# Patient Record
Sex: Female | Born: 1995
Health system: Southern US, Community
[De-identification: ages and names within clinical notes are randomized; demographics above are authoritative.]

## PROBLEM LIST (undated history)

## (undated) DIAGNOSIS — N920 Excessive and frequent menstruation with regular cycle: Secondary | ICD-10-CM

## (undated) DIAGNOSIS — Z789 Other specified health status: Secondary | ICD-10-CM

## (undated) HISTORY — PX: NO PAST SURGERIES: SHX2092

## (undated) HISTORY — DX: Excessive and frequent menstruation with regular cycle: N92.0

---
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
## 2014-01-30 ENCOUNTER — Encounter: Payer: Medicaid Other | Admitting: Obstetrics & Gynecology

## 2014-03-20 ENCOUNTER — Ambulatory Visit (INDEPENDENT_AMBULATORY_CARE_PROVIDER_SITE_OTHER): Payer: Medicaid Other | Admitting: Nurse Practitioner

## 2014-03-20 ENCOUNTER — Encounter: Payer: Self-pay | Admitting: Nurse Practitioner

## 2014-03-20 VITALS — BP 118/64 | HR 89 | Temp 98.2°F | Ht 65.0 in | Wt 160.0 lb

## 2014-03-20 DIAGNOSIS — Z309 Encounter for contraceptive management, unspecified: Secondary | ICD-10-CM | POA: Insufficient documentation

## 2014-03-20 DIAGNOSIS — Z308 Encounter for other contraceptive management: Secondary | ICD-10-CM

## 2014-03-20 NOTE — Progress Notes (Signed)
Referred for " extra- heavy periods and very bad  Cramps for several periods". States last period was better- not heavy.

## 2014-03-20 NOTE — Patient Instructions (Signed)
Contraception Choices Contraception (birth control) is the use of any methods or devices to prevent pregnancy. Below are some methods to help avoid pregnancy. HORMONAL METHODS   Contraceptive implant. This is a thin, plastic tube containing progesterone hormone. It does not contain estrogen hormone. Your health care provider inserts the tube in the inner part of the upper arm. The tube can remain in place for up to 3 years. After 3 years, the implant must be removed. The implant prevents the ovaries from releasing an egg (ovulation), thickens the cervical mucus to prevent sperm from entering the uterus, and thins the lining of the inside of the uterus.  Progesterone-only injections. These injections are given every 3 months by your health care provider to prevent pregnancy. This synthetic progesterone hormone stops the ovaries from releasing eggs. It also thickens cervical mucus and changes the uterine lining. This makes it harder for sperm to survive in the uterus.  Birth control pills. These pills contain estrogen and progesterone hormone. They work by preventing the ovaries from releasing eggs (ovulation). They also cause the cervical mucus to thicken, preventing the sperm from entering the uterus. Birth control pills are prescribed by a health care provider.Birth control pills can also be used to treat heavy periods.  Minipill. This type of birth control pill contains only the progesterone hormone. They are taken every day of each month and must be prescribed by your health care provider.  Birth control patch. The patch contains hormones similar to those in birth control pills. It must be changed once a week and is prescribed by a health care provider.  Vaginal ring. The ring contains hormones similar to those in birth control pills. It is left in the vagina for 3 weeks, removed for 1 week, and then a new one is put back in place. The patient must be comfortable inserting and removing the ring  from the vagina.A health care provider's prescription is necessary.  Emergency contraception. Emergency contraceptives prevent pregnancy after unprotected sexual intercourse. This pill can be taken right after sex or up to 5 days after unprotected sex. It is most effective the sooner you take the pills after having sexual intercourse. Most emergency contraceptive pills are available without a prescription. Check with your pharmacist. Do not use emergency contraception as your only form of birth control. BARRIER METHODS   Female condom. This is a thin sheath (latex or rubber) that is worn over the penis during sexual intercourse. It can be used with spermicide to increase effectiveness.  Female condom. This is a soft, loose-fitting sheath that is put into the vagina before sexual intercourse.  Diaphragm. This is a soft, latex, dome-shaped barrier that must be fitted by a health care provider. It is inserted into the vagina, along with a spermicidal jelly. It is inserted before intercourse. The diaphragm should be left in the vagina for 6 to 8 hours after intercourse.  Cervical cap. This is a round, soft, latex or plastic cup that fits over the cervix and must be fitted by a health care provider. The cap can be left in place for up to 48 hours after intercourse.  Sponge. This is a soft, circular piece of polyurethane foam. The sponge has spermicide in it. It is inserted into the vagina after wetting it and before sexual intercourse.  Spermicides. These are chemicals that kill or block sperm from entering the cervix and uterus. They come in the form of creams, jellies, suppositories, foam, or tablets. They do not require a   prescription. They are inserted into the vagina with an applicator before having sexual intercourse. The process must be repeated every time you have sexual intercourse. INTRAUTERINE CONTRACEPTION  Intrauterine device (IUD). This is a T-shaped device that is put in a woman's uterus  during a menstrual period to prevent pregnancy. There are 2 types:  Copper IUD. This type of IUD is wrapped in copper wire and is placed inside the uterus. Copper makes the uterus and fallopian tubes produce a fluid that kills sperm. It can stay in place for 10 years.  Hormone IUD. This type of IUD contains the hormone progestin (synthetic progesterone). The hormone thickens the cervical mucus and prevents sperm from entering the uterus, and it also thins the uterine lining to prevent implantation of a fertilized egg. The hormone can weaken or kill the sperm that get into the uterus. It can stay in place for 3-5 years, depending on which type of IUD is used. PERMANENT METHODS OF CONTRACEPTION  Female tubal ligation. This is when the woman's fallopian tubes are surgically sealed, tied, or blocked to prevent the egg from traveling to the uterus.  Hysteroscopic sterilization. This involves placing a small coil or insert into each fallopian tube. Your doctor uses a technique called hysteroscopy to do the procedure. The device causes scar tissue to form. This results in permanent blockage of the fallopian tubes, so the sperm cannot fertilize the egg. It takes about 3 months after the procedure for the tubes to become blocked. You must use another form of birth control for these 3 months.  Female sterilization. This is when the female has the tubes that carry sperm tied off (vasectomy).This blocks sperm from entering the vagina during sexual intercourse. After the procedure, the man can still ejaculate fluid (semen). NATURAL PLANNING METHODS  Natural family planning. This is not having sexual intercourse or using a barrier method (condom, diaphragm, cervical cap) on days the woman could become pregnant.  Calendar method. This is keeping track of the length of each menstrual cycle and identifying when you are fertile.  Ovulation method. This is avoiding sexual intercourse during ovulation.  Symptothermal  method. This is avoiding sexual intercourse during ovulation, using a thermometer and ovulation symptoms.  Post-ovulation method. This is timing sexual intercourse after you have ovulated. Regardless of which type or method of contraception you choose, it is important that you use condoms to protect against the transmission of sexually transmitted infections (STIs). Talk with your health care provider about which form of contraception is most appropriate for you. Document Released: 05/19/2005 Document Revised: 05/24/2013 Document Reviewed: 11/11/2012 [employer] Patient Information 2015 [employer]. This information is not intended to replace advice given to you by your health care provider. Make sure you discuss any questions you have with your health care provider.  

## 2014-03-20 NOTE — Progress Notes (Signed)
History:  Alexandra MckusickBreasha Freeman is a 18 y.o. No obstetric history on file. who [facility]. She was on Depo Provera for 5 years for dysmenorrhea and when she came off that she had very heavy and prolonged bleeding for several months. Her menses is now stabilized and regular. Her last cycle lasted 4 days.She has used BCPs in past. She is currently sexually active with one partner and does not plan pregnancy for the next 5 years.   The following portions of the patient's history were reviewed and updated as appropriate: allergies, current medications, past family history, past medical history, past social history, past surgical history and problem list.  Review of Systems:    Objective:  Physical Exam BP 118/64  Pulse 89  Temp(Src) 98.2 F (36.8 C)  Ht 5\' 5"  (1.651 m)  Wt 160 lb (72.576 kg)  BMI 26.63 kg/m2  LMP 03/09/2014 GENERAL: Well-developed, well-nourished female in no acute distress.  HEENT: Normocephalic, atraumatic.  NECK: Supple. Normal thyroid.  LUNGS: Normal rate. Clear to auscultation bilaterally.  HEART: Regular rate and rhythm with no adventitious sounds.  EXTREMITIES: No cyanosis, clubbing, or edema, 2+ distal pulses.   Labs and Imaging No results found. No results found for this or any previous visit (from the past 24 hour(s)).  Assessment & Plan:  Assessment:  Contraception  Plans: Lengthy Discussion about her choices / she will consider her options/ written brochures were also given  Delbert PhenixLinda M Harmoni Lucus, NP 03/20/2014 4:45 PM

## 2015-06-19 ENCOUNTER — Ambulatory Visit: Payer: Medicaid Other | Admitting: Obstetrics

## 2015-07-06 ENCOUNTER — Encounter: Payer: Self-pay | Admitting: *Deleted

## 2015-08-01 ENCOUNTER — Ambulatory Visit: Payer: Medicaid Other | Admitting: Obstetrics & Gynecology

## 2016-07-01 ENCOUNTER — Encounter (HOSPITAL_COMMUNITY): Payer: Self-pay | Admitting: Emergency Medicine

## 2016-07-01 ENCOUNTER — Ambulatory Visit (HOSPITAL_COMMUNITY)
Admission: EM | Admit: 2016-07-01 | Discharge: 2016-07-01 | Disposition: A | Discharge status: Home / Self Care | Payer: Medicaid Other | Attending: Family Medicine | Admitting: Family Medicine

## 2016-07-01 DIAGNOSIS — R69 Illness, unspecified: Secondary | ICD-10-CM

## 2016-07-01 DIAGNOSIS — J111 Influenza due to unidentified influenza virus with other respiratory manifestations: Secondary | ICD-10-CM

## 2016-07-01 MED ORDER — IPRATROPIUM BROMIDE 0.06 % NA SOLN: 2.0000 | Freq: Four times a day (QID) | NASAL | 1 refills | Status: DC

## 2016-07-01 MED ORDER — GUAIFENESIN-CODEINE 100-10 MG/5ML PO SYRP: 10.0000 mL | ORAL_SOLUTION | Freq: Four times a day (QID) | ORAL | 0 refills | Status: DC | PRN

## 2016-07-01 NOTE — ED Provider Notes (Signed)
[facility]    CSN: [id number] Arrival date & time: 07/01/16  1522     History   Chief Complaint Chief Complaint  Patient presents with  . Influenza    HPI Alexandra Freeman is a 21 y.o. female.   The history is provided by the patient.  Influenza  Presenting symptoms: cough, fever, myalgias and rhinorrhea   Presenting symptoms: no sore throat and no vomiting   Severity:  Moderate Onset quality:  Sudden Duration:  3 days Progression:  Worsening Chronicity:  New Relieved by:  None tried Worsened by:  Nothing Ineffective treatments:  None tried Associated symptoms: nasal congestion   Risk factors: sick contacts     Past Medical History:  Diagnosis Date  . Menorrhagia     Patient Active Problem List   Diagnosis Date Noted  . Contraception management 03/20/2014    History reviewed. No pertinent surgical history.  OB History    No data available       Home Medications    Prior to Admission medications   Medication Sig Start Date End Date Taking? Authorizing Provider  PRESCRIPTION MEDICATION Inject 1 each as directed See admin instructions. Depo birth control injection every three months   Yes Historical Provider, MD  ibuprofen (ADVIL,MOTRIN) 800 MG tablet Take 1 tablet (800 mg total) by mouth 3 (three) times daily. 12/12/12   Francee PiccoloJennifer Piepenbrink, PA-C    Family History Family History  Problem Relation Age of Onset  . Polymyositis Mother     Social History Social History  Substance Use Topics  . Smoking status: Never Smoker  . Smokeless tobacco: Never Used  . Alcohol use No     Allergies   Patient has no known allergies.   Review of Systems Review of Systems  Constitutional: Positive for fever.  HENT: Positive for congestion, postnasal drip and rhinorrhea. Negative for sore throat.   Respiratory: Positive for cough.   Cardiovascular: Negative.   Gastrointestinal: Negative.  Negative for vomiting.  Musculoskeletal: Positive for  myalgias.  All other systems reviewed and are negative.    Physical Exam Triage Vital Signs ED Triage Vitals [07/01/16 1638]  Enc Vitals Group     BP 136/88     Pulse Rate (!) 121     Resp      Temp 100.7 F (38.2 C)     Temp Source Oral     SpO2 100 %     Weight      Height      Head Circumference      Peak Flow      Pain Score 8     Pain Loc      Pain Edu?      Excl. in GC?    No data found.   Updated Vital Signs BP 136/88 (BP Location: Right Arm)   Pulse (!) 121   Temp 100.7 F (38.2 C) (Oral)   LMP 07/01/2016   SpO2 100%   Visual Acuity Right Eye Distance:   Left Eye Distance:   Bilateral Distance:    Right Eye Near:   Left Eye Near:    Bilateral Near:     Physical Exam  Constitutional: She is oriented to person, place, and time. She appears well-developed and well-nourished. No distress.  HENT:  Right Ear: External ear normal.  Left Ear: External ear normal.  Nose: Nose normal.  Mouth/Throat: Oropharynx is clear and moist.  Eyes: Pupils are equal, round, and reactive to light.  Neck: Normal  range of motion.  Cardiovascular: Normal rate, regular rhythm and normal heart sounds.   Pulmonary/Chest: Effort normal and breath sounds normal.  Lymphadenopathy:    She has no cervical adenopathy.  Neurological: She is alert and oriented to person, place, and time.  Skin: Skin is warm and dry.  Nursing note and vitals reviewed.    UC Treatments / Results  Labs (all labs ordered are listed, but only abnormal results are displayed) Labs Reviewed - No data to display  EKG  EKG Interpretation None       Radiology No results found.  Procedures Procedures (including critical care time)  Medications Ordered in UC Medications - No data to display   Initial Impression / Assessment and Plan / UC Course  I have reviewed the triage vital signs and the nursing notes.  Pertinent labs & imaging results that were available during my care of the  patient were reviewed by me and considered in my medical decision making (see chart for details).      Final Clinical Impressions(s) / UC Diagnoses   Final diagnoses:  None    New Prescriptions New Prescriptions   No medications on file     Linna Hoff, MD 07/01/16 1610

## 2016-07-01 NOTE — ED Triage Notes (Signed)
Pt has been suffering from a cough, left ear pain and ringing, body aches, fatigue and fever for two days.

## 2016-07-01 NOTE — Discharge Instructions (Signed)
Drink plenty of fluids as discussed, use medicine as prescribed, and mucinex or delsym for cough.advil or tylenol for fever and aching. Return or see your doctor if further problems

## 2016-07-03 ENCOUNTER — Telehealth (HOSPITAL_COMMUNITY): Payer: Self-pay | Admitting: Emergency Medicine

## 2016-07-03 ENCOUNTER — Ambulatory Visit (HOSPITAL_COMMUNITY)
Admission: EM | Admit: 2016-07-03 | Discharge: 2016-07-03 | Disposition: A | Discharge status: Home / Self Care | Payer: Self-pay

## 2016-07-03 NOTE — Telephone Encounter (Signed)
Patient returned to [facility] requesting a work note.  Spoke with dr Piedad Climeshonig about patient.  Dr Piedad Climeshonig agreed to work note for patient to return to work tomorrow on 07/04/2016

## 2016-07-03 NOTE — Telephone Encounter (Signed)
Provided note to patient in lobby.  Patient persists that she cannot taste foods.  Encouraged patient to push fluids, not foods.  Patient insists work is not expecting her back until Monday.  Suggested patient see pcp.  Suggested work might require formal paperwork and we do not do paperwork.  Told patient if she needed more time, she would need to check in for reevaluation.

## 2016-09-29 ENCOUNTER — Encounter (HOSPITAL_COMMUNITY): Payer: Self-pay | Admitting: Emergency Medicine

## 2016-09-29 ENCOUNTER — Ambulatory Visit (HOSPITAL_COMMUNITY)
Admission: EM | Admit: 2016-09-29 | Discharge: 2016-09-29 | Disposition: A | Discharge status: Home / Self Care | Payer: Self-pay | Attending: Internal Medicine | Admitting: Internal Medicine

## 2016-09-29 DIAGNOSIS — H6123 Impacted cerumen, bilateral: Secondary | ICD-10-CM

## 2016-09-29 NOTE — ED Triage Notes (Signed)
Left ear pain x 3 days. Has used otc drops and sweet oil and will "not pop".  Patient feels hearing is normal.  Denies cough, cold or runny nose

## 2016-09-29 NOTE — ED Provider Notes (Signed)
CSN: [id number]     Arrival date & time 09/29/16  1009 History   First MD Initiated Contact with Patient 09/29/16 1152     Chief Complaint  Patient presents with  . Otalgia   (Consider location/radiation/quality/duration/timing/severity/associated sxs/prior Treatment) 21 year old female states that her left ear feels like is clogged up and it needs to pop to unclear. This started about 3 days ago. Denies ear pain. Denies trauma. Denies loss of hearing although when placing earbuds in her ear for glistening she believes that it is more decreased in the left than the right. Denies PND, URI symptoms.      Past Medical History:  Diagnosis Date  . Menorrhagia    History reviewed. No pertinent surgical history. Family History  Problem Relation Age of Onset  . Polymyositis Mother    Social History  Substance Use Topics  . Smoking status: Never Smoker  . Smokeless tobacco: Never Used  . Alcohol use No   OB History    No data available     Review of Systems  Constitutional: Negative.   HENT: Positive for hearing loss. Negative for congestion, ear discharge, ear pain, rhinorrhea, sinus pain, sinus pressure and sore throat.   Eyes: Negative.   Respiratory: Negative.   Gastrointestinal: Negative.   All other systems reviewed and are negative.   Allergies  Patient has no known allergies.  Home Medications   Prior to Admission medications   Medication Sig Start Date End Date Taking? Authorizing Provider  guaiFENesin-codeine (ROBITUSSIN AC) 100-10 MG/5ML syrup Take 10 mLs by mouth 4 (four) times daily as needed for cough. 07/01/16   Linna Hoff, MD  ibuprofen (ADVIL,MOTRIN) 800 MG tablet Take 1 tablet (800 mg total) by mouth 3 (three) times daily. 12/12/12   Jennifer Piepenbrink, PA-C  ipratropium (ATROVENT) 0.06 % nasal spray Place 2 sprays into both nostrils 4 (four) times daily. 07/01/16   Linna Hoff, MD  PRESCRIPTION MEDICATION Inject 1 each as directed See admin  instructions. Depo birth control injection every three months    Historical Provider, MD   Meds Ordered and Administered this Visit  Medications - No data to display  BP (!) 129/97 (BP Location: Left Arm) Comment: notified rn  Pulse 63   Temp 98.6 F (37 C) (Oral)   Resp 14   LMP 09/26/2016   SpO2 100%  No data found.   Physical Exam  Constitutional: She is oriented to person, place, and time. She appears well-developed and well-nourished. No distress.  HENT:  Mouth/Throat: No oropharyngeal exudate.  Bilateral TMs obscured by cerumen. Oropharynx is clear and moist.  Eyes: EOM are normal.  Neck: Normal range of motion. Neck supple.  Cardiovascular: Normal rate.   Pulmonary/Chest: Effort normal.  Musculoskeletal: Normal range of motion.  Lymphadenopathy:    She has no cervical adenopathy.  Neurological: She is alert and oriented to person, place, and time.  Skin: Skin is warm and dry.  Psychiatric: She has a normal mood and affect.  Nursing note and vitals reviewed.   Urgent Care Course     Procedures (including critical care time)  Labs Review Labs Reviewed - No data to display  Imaging Review No results found.   Visual Acuity Review  Right Eye Distance:   Left Eye Distance:   Bilateral Distance:    Right Eye Near:   Left Eye Near:    Bilateral Near:         MDM   1. Bilateral impacted cerumen  Irrigated years as discussed in the shower. May also use any over-the-counter irrigation totals. Use these methods regularly to help keep wax from building up. Also realize that if you use ear buds that this generally keeps wax coming out and pushes the wax inward toward the eardrum.  Post irrigation the left ear is clear with minor injection to the TM. Patient states she can hear better, feels better and does not feel the popping or pressure sensation. The right ear is partially viewed has some wax remains in the EAC. She has no pain. In generally feels  better.   Hayden Rasmussen, NP 09/29/16 1236    Hayden Rasmussen, NP 09/29/16 [phone]

## 2016-09-29 NOTE — Discharge Instructions (Signed)
Irrigated years as discussed in the shower. May also use any over-the-counter irrigation totals. Use these methods regularly to help keep wax from building up. Also realize that if you use ear buds that this generally keeps wax coming out and pushes the wax inward toward the eardrum.

## 2017-10-12 ENCOUNTER — Ambulatory Visit: Payer: Medicaid Other | Admitting: Family Medicine

## 2017-10-12 NOTE — Progress Notes (Deleted)
  No chief complaint on file.   HPI  4 review of systems  Past Medical History:  Diagnosis Date  . Menorrhagia     Current Outpatient Medications  Medication Sig Dispense Refill  . guaiFENesin-codeine (ROBITUSSIN AC) 100-10 MG/5ML syrup Take 10 mLs by mouth 4 (four) times daily as needed for cough. 180 mL 0  . ibuprofen (ADVIL,MOTRIN) 800 MG tablet Take 1 tablet (800 mg total) by mouth 3 (three) times daily. 21 tablet 0  . ipratropium (ATROVENT) 0.06 % nasal spray Place 2 sprays into both nostrils 4 (four) times daily. 15 mL 1  . PRESCRIPTION MEDICATION Inject 1 each as directed See admin instructions. Depo birth control injection every three months     No current facility-administered medications for this visit.     Allergies: No Known Allergies  No past surgical history on file.  Social History   Socioeconomic History  . Marital status: Single    Spouse name: Not on file  . Number of children: Not on file  . Years of education: Not on file  . Highest education level: Not on file  Occupational History  . Not on file  Social Needs  . Financial resource strain: Not on file  . Food insecurity:    Worry: Not on file    Inability: Not on file  . Transportation needs:    Medical: Not on file    Non-medical: Not on file  Tobacco Use  . Smoking status: Never Smoker  . Smokeless tobacco: Never Used  Substance and Sexual Activity  . Alcohol use: No  . Drug use: No  . Sexual activity: Yes    Birth control/protection: None  Lifestyle  . Physical activity:    Days per week: Not on file    Minutes per session: Not on file  . Stress: Not on file  Relationships  . Social connections:    Talks on phone: Not on file    Gets together: Not on file    Attends religious service: Not on file    Active member of club or organization: Not on file    Attends meetings of clubs or organizations: Not on file    Relationship status: Not on file  Other Topics Concern  . Not on  file  Social History Narrative  . Not on file    Family History  Problem Relation Age of Onset  . Polymyositis Mother      ROS Review of Systems See HPI Constitution: No fevers or chills No malaise No diaphoresis Skin: No rash or itching Eyes: no blurry vision, no double vision GU: no dysuria or hematuria Neuro: no dizziness or headaches * all others reviewed and negative   Objective: There were no vitals filed for this visit.  Physical Exam  Assessment and Plan There are no diagnoses linked to this encounter.   Alexandra Freeman P [employer]

## 2019-12-05 ENCOUNTER — Other Ambulatory Visit: Payer: Self-pay

## 2019-12-05 ENCOUNTER — Inpatient Hospital Stay (HOSPITAL_COMMUNITY)
Admission: AD | Admit: 2019-12-05 | Discharge: 2019-12-05 | Disposition: A | Discharge status: Home / Self Care | Payer: Medicaid Other | Attending: Obstetrics & Gynecology | Admitting: Obstetrics & Gynecology

## 2019-12-05 ENCOUNTER — Inpatient Hospital Stay (HOSPITAL_COMMUNITY): Payer: Medicaid Other

## 2019-12-05 ENCOUNTER — Encounter (HOSPITAL_COMMUNITY): Payer: Self-pay | Admitting: Obstetrics & Gynecology

## 2019-12-05 DIAGNOSIS — Z79899 Other long term (current) drug therapy: Secondary | ICD-10-CM | POA: Diagnosis not present

## 2019-12-05 DIAGNOSIS — O3680X0 Pregnancy with inconclusive fetal viability, not applicable or unspecified: Secondary | ICD-10-CM

## 2019-12-05 DIAGNOSIS — O26891 Other specified pregnancy related conditions, first trimester: Secondary | ICD-10-CM

## 2019-12-05 DIAGNOSIS — Z3A01 Less than 8 weeks gestation of pregnancy: Secondary | ICD-10-CM | POA: Diagnosis not present

## 2019-12-05 DIAGNOSIS — N83292 Other ovarian cyst, left side: Secondary | ICD-10-CM | POA: Insufficient documentation

## 2019-12-05 DIAGNOSIS — O039 Complete or unspecified spontaneous abortion without complication: Secondary | ICD-10-CM | POA: Diagnosis present

## 2019-12-05 DIAGNOSIS — Z791 Long term (current) use of non-steroidal anti-inflammatories (NSAID): Secondary | ICD-10-CM | POA: Insufficient documentation

## 2019-12-05 DIAGNOSIS — R109 Unspecified abdominal pain: Secondary | ICD-10-CM

## 2019-12-05 HISTORY — DX: Other specified health status: Z78.9

## 2019-12-05 LAB — URINALYSIS, ROUTINE W REFLEX MICROSCOPIC
Bilirubin Urine: NEGATIVE
Glucose, UA: NEGATIVE mg/dL
Hgb urine dipstick: NEGATIVE
Ketones, ur: NEGATIVE mg/dL
Leukocytes,Ua: NEGATIVE
Nitrite: NEGATIVE
Protein, ur: NEGATIVE mg/dL
Specific Gravity, Urine: 1.019 (ref 1.005–1.030)
pH: 6 (ref 5.0–8.0)

## 2019-12-05 LAB — HIV ANTIBODY (ROUTINE TESTING W REFLEX): HIV Screen 4th Generation wRfx: NONREACTIVE

## 2019-12-05 LAB — CBC
HCT: 38.1 % (ref 36.0–46.0)
Hemoglobin: 12.2 g/dL (ref 12.0–15.0)
MCH: 28.3 pg (ref 26.0–34.0)
MCHC: 32 g/dL (ref 30.0–36.0)
MCV: 88.4 fL (ref 80.0–100.0)
Platelets: 206 10*3/uL (ref 150–400)
RBC: 4.31 MIL/uL (ref 3.87–5.11)
RDW: 17.1 % — ABNORMAL HIGH (ref 11.5–15.5)
WBC: 7 10*3/uL (ref 4.0–10.5)
nRBC: 0 % (ref 0.0–0.2)

## 2019-12-05 LAB — WET PREP, GENITAL
Clue Cells Wet Prep HPF POC: NONE SEEN
Sperm: NONE SEEN
Trich, Wet Prep: NONE SEEN
Yeast Wet Prep HPF POC: NONE SEEN

## 2019-12-05 LAB — ABO/RH: ABO/RH(D): A POS

## 2019-12-05 LAB — HCG, QUANTITATIVE, PREGNANCY: hCG, Beta Chain, Quant, S: 224 m[IU]/mL — ABNORMAL HIGH (ref <5)

## 2019-12-05 NOTE — MAU Provider Note (Signed)
Chief Complaint: Abdominal Pain and Possible Pregnancy   First Provider Initiated Contact with Patient 12/05/19 1438      SUBJECTIVE HPI: Alexandra Freeman is a 24 y.o. G1P0 at [redacted]w[redacted]d by LMP who presents to maternity admissions reporting onset of lower abdominal cramping 2 days ago.  She also reports seasonal allergies and is not sure she can take the Claritin she usually takes.  She denies vaginal bleeding, vaginal itching/burning, urinary symptoms, h/a, dizziness, n/v, or fever/chills.     Location: lower abdomen Quality: cramping like menstrual cramps Severity: 4/10 on pain scale Duration: 2 days Timing: intermittent Modifying factors: none Associated signs and symptoms: none  HPI  Past Medical History:  Diagnosis Date  . Medical history non-contributory   . Menorrhagia    Past Surgical History:  Procedure Laterality Date  . NO PAST SURGERIES     Social History   Socioeconomic History  . Marital status: Single    Spouse name: Not on file  . Number of children: Not on file  . Years of education: Not on file  . Highest education level: Not on file  Occupational History  . Not on file  Tobacco Use  . Smoking status: Never Smoker  . Smokeless tobacco: Never Used  Vaping Use  . Vaping Use: Never used  Substance and Sexual Activity  . Alcohol use: No  . Drug use: No  . Sexual activity: Yes    Birth control/protection: None  Other Topics Concern  . Not on file  Social History Narrative  . Not on file   Social Determinants of Health   Financial Resource Strain:   . Difficulty of Paying Living Expenses:   Food Insecurity:   . Worried About Programme researcher, broadcasting/film/video in the Last Year:   . Barista in the Last Year:   Transportation Needs:   . Freight forwarder (Medical):   Marland Kitchen Lack of Transportation (Non-Medical):   Physical Activity:   . Days of Exercise per Week:   . Minutes of Exercise per Session:   Stress:   . Feeling of Stress :   Social Connections:    . Frequency of Communication with Friends and Family:   . Frequency of Social Gatherings with Friends and Family:   . Attends Religious Services:   . Active Member of Clubs or Organizations:   . Attends Banker Meetings:   Marland Kitchen Marital Status:   Intimate Partner Violence:   . Fear of Current or Ex-Partner:   . Emotionally Abused:   Marland Kitchen Physically Abused:   . Sexually Abused:    No current facility-administered medications on file prior to encounter.   Current Outpatient Medications on File Prior to Encounter  Medication Sig Dispense Refill  . guaiFENesin-codeine (ROBITUSSIN AC) 100-10 MG/5ML syrup Take 10 mLs by mouth 4 (four) times daily as needed for cough. (Patient not taking: Reported on 12/05/2019) 180 mL 0  . ibuprofen (ADVIL,MOTRIN) 800 MG tablet Take 1 tablet (800 mg total) by mouth 3 (three) times daily. (Patient not taking: Reported on 12/05/2019) 21 tablet 0  . ipratropium (ATROVENT) 0.06 % nasal spray Place 2 sprays into both nostrils 4 (four) times daily. (Patient not taking: Reported on 12/05/2019) 15 mL 1   No Known Allergies  ROS:  Review of Systems  Constitutional: Negative for chills, fatigue and fever.  HENT: Positive for postnasal drip and rhinorrhea.   Respiratory: Negative for shortness of breath.   Cardiovascular: Negative for chest pain.  Gastrointestinal:  Positive for abdominal pain. Negative for nausea and vomiting.  Genitourinary: Positive for pelvic pain. Negative for difficulty urinating, dysuria, flank pain, vaginal bleeding, vaginal discharge and vaginal pain.  Musculoskeletal: Negative for back pain.  Neurological: Negative for dizziness and headaches.  Psychiatric/Behavioral: Negative.      I have reviewed patient's Past Medical Hx, Surgical Hx, Family Hx, Social Hx, medications and allergies.   Physical Exam   Patient Vitals for the past 24 hrs:  BP Temp Temp src Pulse Resp SpO2 Height Weight  12/05/19 1314 123/70 98.3 F (36.8 C) Oral  95 17 100 % 5\' 5"  (1.651 m) 68 kg   Constitutional: Well-developed, well-nourished female in no acute distress.  Cardiovascular: normal rate Respiratory: normal effort GI: Abd soft, non-tender. Pos BS x 4 MS: Extremities nontender, no edema, normal ROM Neurologic: Alert and oriented x 4.  GU: Neg CVAT.  PELVIC EXAM: Wet prep/GCC collected by blind swab   LAB RESULTS Results for orders placed or performed during the hospital encounter of 12/05/19 (from the past 24 hour(s))  Urinalysis, Routine w reflex microscopic     Status: None   Collection Time: 12/05/19  1:15 PM  Result Value Ref Range   Color, Urine YELLOW YELLOW   APPearance CLEAR CLEAR   Specific Gravity, Urine 1.019 1.005 - 1.030   pH 6.0 5.0 - 8.0   Glucose, UA NEGATIVE NEGATIVE mg/dL   Hgb urine dipstick NEGATIVE NEGATIVE   Bilirubin Urine NEGATIVE NEGATIVE   Ketones, ur NEGATIVE NEGATIVE mg/dL   Protein, ur NEGATIVE NEGATIVE mg/dL   Nitrite NEGATIVE NEGATIVE   Leukocytes,Ua NEGATIVE NEGATIVE  CBC     Status: Abnormal   Collection Time: 12/05/19  1:50 PM  Result Value Ref Range   WBC 7.0 4.0 - 10.5 K/uL   RBC 4.31 3.87 - 5.11 MIL/uL   Hemoglobin 12.2 12.0 - 15.0 g/dL   HCT 02/05/20 36 - 46 %   MCV 88.4 80.0 - 100.0 fL   MCH 28.3 26.0 - 34.0 pg   MCHC 32.0 30.0 - 36.0 g/dL   RDW 46.5 (H) 03.5 - 46.5 %   Platelets 206 150 - 400 K/uL   nRBC 0.0 0.0 - 0.2 %  hCG, quantitative, pregnancy     Status: Abnormal   Collection Time: 12/05/19  1:50 PM  Result Value Ref Range   hCG, Beta Chain, Quant, S 224 (H) <5 mIU/mL  ABO/Rh     Status: None   Collection Time: 12/05/19  1:50 PM  Result Value Ref Range   ABO/RH(D) A POS    No rh immune globuloin      NOT A RH IMMUNE GLOBULIN CANDIDATE, PT RH POSITIVE Performed at [facility], [address], [place], [place]   Wet prep, genital     Status: Abnormal   Collection Time: 12/05/19  3:00 PM   Specimen: PATH Cytology Cervicovaginal Ancillary Only  Result  Value Ref Range   Yeast Wet Prep HPF POC NONE SEEN NONE SEEN   Trich, Wet Prep NONE SEEN NONE SEEN   Clue Cells Wet Prep HPF POC NONE SEEN NONE SEEN   WBC, Wet Prep HPF POC MODERATE (A) NONE SEEN   Sperm NONE SEEN     --/--/A POS (07/05 1350)  IMAGING 12-16-1970 OB LESS THAN 14 WEEKS WITH OB TRANSVAGINAL  Result Date: 12/05/2019 CLINICAL DATA:  Pain since 12/03/2019. Last menstrual period on 11/05/2019. Beta HCG is 224. EXAM: OBSTETRIC <14 WK 01/05/2020 AND TRANSVAGINAL OB  US TECHNIQUE: Both transabdominal and transvaginal ultrasound examinations were performed for complete evaluation of the gestation as well as the maternal uterus, adnexal regions, and pelvic cul-de-sac. Transvaginal technique was performed to assess early pregnancy. COMPARISON:  Report from pelvic ultrasound dated 09/21/2015. FINDINGS: Intrauterine gestational sac: None Yolk sac:  Not Visualized. Embryo:  Not Visualized. Cardiac Activity: Not Visualized. Subchorionic hemorrhage:  None visualized. Maternal uterus/adnexae: The right ovary appears normal. A left ovarian simple cyst measures 3.1 x 2.1 x 2.9 cm. There is trace fluid within the endometrium. IMPRESSION: No intrauterine gestational sac is identified. Trace fluid is seen within the endometrium. Spontaneous abortion, intrauterine pregnancy, and ectopic pregnancy are differential considerations. Recommend follow-up beta hCG levels and follow-up pelvic ultrasound in 7-10 days as clinically warranted. Electronically Signed   By: Romona Curls M.D.   On: 12/05/2019 15:50    MAU Management/MDM: Orders Placed This Encounter  Procedures  . Wet prep, genital  . US OB LESS THAN 14 WEEKS WITH OB TRANSVAGINAL  . Urinalysis, Routine w reflex microscopic  . CBC  . hCG, quantitative, pregnancy  . HIV Antibody (routine testing w rflx)  . ABO/Rh  . Discharge patient    No orders of the defined types were placed in this encounter.   Findings today could represent a normal early pregnancy,  spontaneous abortion or ectopic pregnancy which can be life-threatening.  Ectopic precautions were given to the patient with plan to return in 48 hours for repeat quant hcg to evaluate pregnancy development. List of safe medications in pregnancy given, pt to resume Claritin for allergies if desired. Pt discharged with strict ectopic precautions.  ASSESSMENT 1. Pregnancy of unknown anatomic location   2. Abdominal pain during pregnancy in first trimester     PLAN Discharge home Allergies as of 12/05/2019   No Known Allergies     Medication List    STOP taking these medications   guaiFENesin-codeine 100-10 MG/5ML syrup Commonly known as: ROBITUSSIN AC   ibuprofen 800 MG tablet Commonly known as: ADVIL   ipratropium 0.06 % nasal spray Commonly known as: Atrovent       Follow-up Information    [facility] for [facility] Follow up.   Specialty: Obstetrics and Gynecology Why: See scheduled appointment on 12/07/19.  Return to MAU with emergencies. Contact information: [address] [place] [address] [phone]              Sharen Counter Certified Nurse-Midwife 12/05/2019  5:00 PM

## 2019-12-05 NOTE — MAU Note (Signed)
Has been having a lot of pain in lower abd, going on since 7/3.  Expected period that day, never came on.  Has not done a preg test.

## 2019-12-06 LAB — GC/CHLAMYDIA PROBE AMP (~~LOC~~) NOT AT ARMC
Chlamydia: NEGATIVE
Comment: NEGATIVE
Comment: NORMAL
Neisseria Gonorrhea: NEGATIVE

## 2019-12-07 ENCOUNTER — Ambulatory Visit (INDEPENDENT_AMBULATORY_CARE_PROVIDER_SITE_OTHER): Payer: Self-pay

## 2019-12-07 ENCOUNTER — Other Ambulatory Visit: Payer: Self-pay

## 2019-12-07 DIAGNOSIS — O3680X0 Pregnancy with inconclusive fetal viability, not applicable or unspecified: Secondary | ICD-10-CM

## 2019-12-07 LAB — BETA HCG QUANT (REF LAB): hCG Quant: 531 m[IU]/mL

## 2019-12-07 NOTE — Progress Notes (Signed)
Pt here today for STAT beta lab s/p pregnancy of unknown location.  Pt states that she is here to make sure that her increase like they should.  Pt denies pain and vaginal bleeding.  Pt states "I feel good."  I explained to the pt that it will take approximately two hrs for results.  I will review with provider and call her with results and f/u.  Pt verbalized understanding.   Received notification from [facility] that pt beta results are 531.  Reviewed results with Dr. Macon Large- provider recommendation to have U/S in 10-14 days.  OB US scheduled for July 20th @ 1545.  Pt notified of results and U/S appt.  Pt verbalized understanding with no further questions.  Addison Naegeli, RN  12/07/19

## 2019-12-08 NOTE — Progress Notes (Signed)
Patient was assessed and managed by nursing staff during this encounter. I have reviewed the chart and agree with the documentation and plan. I have also made any necessary editorial changes.  Jaynie Collins, MD 12/08/2019 9:22 AM

## 2019-12-20 ENCOUNTER — Other Ambulatory Visit: Payer: Self-pay

## 2019-12-20 ENCOUNTER — Ambulatory Visit
Admission: RE | Admit: 2019-12-20 | Discharge: 2019-12-20 | Disposition: A | Discharge status: Home / Self Care | Payer: Medicaid Other | Source: Ambulatory Visit | Attending: Obstetrics & Gynecology | Admitting: Obstetrics & Gynecology

## 2019-12-20 DIAGNOSIS — O3680X0 Pregnancy with inconclusive fetal viability, not applicable or unspecified: Secondary | ICD-10-CM

## 2019-12-22 ENCOUNTER — Telehealth: Payer: Self-pay | Admitting: *Deleted

## 2019-12-22 NOTE — Telephone Encounter (Addendum)
-----   Message from Tereso Newcomer, MD sent at 12/21/2019  8:30 AM EDT ----- Normal IUP. Can start prenatal care ASAP. Prescribe prenatal vitamins.  7/22  1000  Called pt and informed her of test results. She was advised to begin taking prenatal vitamins and to schedule prenatal care. Pt voiced understanding and had no questions.

## 2020-01-24 ENCOUNTER — Encounter: Payer: Self-pay | Admitting: Obstetrics and Gynecology

## 2020-01-24 ENCOUNTER — Other Ambulatory Visit: Payer: Self-pay

## 2020-01-24 ENCOUNTER — Ambulatory Visit (INDEPENDENT_AMBULATORY_CARE_PROVIDER_SITE_OTHER): Payer: Medicaid Other | Admitting: Obstetrics and Gynecology

## 2020-01-24 ENCOUNTER — Other Ambulatory Visit (HOSPITAL_COMMUNITY)
Admission: RE | Admit: 2020-01-24 | Discharge: 2020-01-24 | Disposition: A | Discharge status: Home / Self Care | Payer: Medicaid Other | Source: Ambulatory Visit | Attending: Obstetrics and Gynecology | Admitting: Obstetrics and Gynecology

## 2020-01-24 VITALS — BP 114/78 | HR 90 | Wt 153.5 lb

## 2020-01-24 DIAGNOSIS — Z3A11 11 weeks gestation of pregnancy: Secondary | ICD-10-CM

## 2020-01-24 DIAGNOSIS — Z3480 Encounter for supervision of other normal pregnancy, unspecified trimester: Secondary | ICD-10-CM | POA: Insufficient documentation

## 2020-01-24 DIAGNOSIS — O219 Vomiting of pregnancy, unspecified: Secondary | ICD-10-CM

## 2020-01-24 DIAGNOSIS — Z3401 Encounter for supervision of normal first pregnancy, first trimester: Secondary | ICD-10-CM | POA: Insufficient documentation

## 2020-01-24 LAB — POCT URINALYSIS DIP (DEVICE)
Bilirubin Urine: NEGATIVE
Glucose, UA: NEGATIVE mg/dL
Hgb urine dipstick: NEGATIVE
Ketones, ur: NEGATIVE mg/dL
Nitrite: NEGATIVE
Protein, ur: NEGATIVE mg/dL
Specific Gravity, Urine: 1.02 (ref 1.005–1.030)
Urobilinogen, UA: 0.2 mg/dL (ref 0.0–1.0)
pH: 7.5 (ref 5.0–8.0)

## 2020-01-24 LAB — OB RESULTS CONSOLE GBS: GBS: POSITIVE

## 2020-01-24 MED ORDER — BLOOD PRESSURE KIT DEVI: 1.0000 | 0 refills | Status: DC | PRN

## 2020-01-24 MED ORDER — DOXYLAMINE SUCCINATE (SLEEP) 25 MG PO TABS: 25.0000 mg | ORAL_TABLET | Freq: Four times a day (QID) | ORAL | 2 refills | Status: DC | PRN

## 2020-01-24 MED ORDER — PYRIDOXINE HCL 25 MG PO TABS: 25.0000 mg | ORAL_TABLET | Freq: Four times a day (QID) | ORAL | 3 refills | Status: DC | PRN

## 2020-01-24 NOTE — Progress Notes (Signed)
  Subjective:    Alexandra Freeman is a G1P0 [redacted]w[redacted]d being seen today for her first obstetrical visit.  This is her first pregnancy, reports it was unplanned but intends to keep baby. Patient does not intend to breast feed. Pregnancy history fully reviewed.  Is experiencing nausea but its getting better. FOB involved, has good support system.     Vitals:   01/24/20 1406  BP: 114/78  Pulse: 90  Weight: 153 lb 8 oz (69.6 kg)    HISTORY: OB History  Gravida Para Term Preterm AB Living  2 0 0 0 1 0  SAB TAB Ectopic Multiple Live Births  0 1 0 0 0    # Outcome Date GA Lbr Len/2nd Weight Sex Delivery Anes PTL Lv  2 Current           1 TAB            Past Medical History:  Diagnosis Date  . Medical history non-contributory   . Menorrhagia    Past Surgical History:  Procedure Laterality Date  . NO PAST SURGERIES     Family History  Problem Relation Age of Onset  . Polymyositis Mother      Exam    Uterus:     Pelvic Exam:    Perineum: No Hemorrhoids   Vulva: normal   Vagina:  normal mucosa   Cervix: retroverted, no lesions   Adnexa: not evaluated  System: Breast:  normal appearance, no masses or tenderness   Skin: normal coloration and turgor, no rashes    Neurologic: oriented, normal   Extremities: normal strength, tone, and muscle mass   HEENT PERRLA and extra ocular movement intact   Mouth/Teeth mucous membranes moist, pharynx normal without lesions   Neck supple   Cardiovascular: regular rate and rhythm   Respiratory:  appears well, vitals normal, no respiratory distress, acyanotic, normal RR, ear and throat exam is normal, neck free of mass or lymphadenopathy, chest clear, no wheezing, crepitations, rhonchi, normal symmetric air entry   Abdomen: soft, non-tender; bowel sounds normal; no masses,  no organomegaly   Urinary: urethral meatus normal      Assessment:    Pregnancy: G1P0 Patient Active Problem List   Diagnosis Date Noted  . Supervision of low-risk  first pregnancy, first trimester 01/24/2020        Plan:    Supervision of normal pregnancy, [redacted] weeks gestation  Initial labs drawn. Prenatal vitamins. Problem list reviewed and updated. Genetic Screening discussed Integrated Screen: ordered. Ultrasound discussed; fetal survey: requested. Follow up in 4 weeks.   Nausea/Vomiting in Pregnancy  -given script for B6 and doxylamine and instructed on use. Patient verbalizes understanding.   Gita Kudo 01/24/2020

## 2020-01-25 ENCOUNTER — Telehealth: Payer: Self-pay | Admitting: Obstetrics and Gynecology

## 2020-01-25 ENCOUNTER — Encounter: Payer: Self-pay | Admitting: *Deleted

## 2020-01-25 LAB — CBC/D/PLT+RPR+RH+ABO+RUB AB...
Antibody Screen: NEGATIVE
Basophils Absolute: 0 10*3/uL (ref 0.0–0.2)
Basos: 0 %
EOS (ABSOLUTE): 0 10*3/uL (ref 0.0–0.4)
Eos: 0 %
HCV Ab: <0.1 s/co ratio (ref 0.0–0.9)
HIV Screen 4th Generation wRfx: NONREACTIVE
Hematocrit: 37.1 % (ref 34.0–46.6)
Hemoglobin: 11.8 g/dL (ref 11.1–15.9)
Hepatitis B Surface Ag: NEGATIVE
Immature Grans (Abs): 0 10*3/uL (ref 0.0–0.1)
Immature Granulocytes: 0 %
Lymphocytes Absolute: 1.8 10*3/uL (ref 0.7–3.1)
Lymphs: 16 %
MCH: 28.6 pg (ref 26.6–33.0)
MCHC: 31.8 g/dL (ref 31.5–35.7)
MCV: 90 fL (ref 79–97)
Monocytes Absolute: 0.5 10*3/uL (ref 0.1–0.9)
Monocytes: 5 %
Neutrophils Absolute: 8.9 10*3/uL — ABNORMAL HIGH (ref 1.4–7.0)
Neutrophils: 79 %
Platelets: 175 10*3/uL (ref 150–450)
RBC: 4.12 x10E6/uL (ref 3.77–5.28)
RDW: 17.1 % — ABNORMAL HIGH (ref 11.7–15.4)
RPR Ser Ql: NONREACTIVE
Rh Factor: POSITIVE
Rubella Antibodies, IGG: 1.95 index (ref >0.99)
WBC: 11.3 10*3/uL — ABNORMAL HIGH (ref 3.4–10.8)

## 2020-01-25 LAB — CYTOLOGY - PAP
Chlamydia: NEGATIVE
Comment: NEGATIVE
Comment: NORMAL
Diagnosis: NEGATIVE
Neisseria Gonorrhea: NEGATIVE

## 2020-01-25 LAB — HCV INTERPRETATION

## 2020-01-25 MED ORDER — METRONIDAZOLE 500 MG PO TABS: 500.0000 mg | ORAL_TABLET | Freq: Two times a day (BID) | ORAL | 0 refills | Status: AC

## 2020-01-25 NOTE — Telephone Encounter (Signed)
Notified of trichomonas infection on cervicovaginal ancillary testing. Discussed etiology and treatment. Script sent for metronidazole and advised patient that her partner be treated as well. Patient verbalized understanding.   Casper Harrison, MD [facility] Family Medicine Fellow, [facility] for [facility], [facility]

## 2020-01-30 LAB — URINE CULTURE, OB REFLEX

## 2020-01-30 LAB — CULTURE, OB URINE

## 2020-02-02 ENCOUNTER — Encounter: Payer: Self-pay | Admitting: General Practice

## 2020-02-21 ENCOUNTER — Other Ambulatory Visit: Payer: Self-pay

## 2020-02-21 ENCOUNTER — Ambulatory Visit (INDEPENDENT_AMBULATORY_CARE_PROVIDER_SITE_OTHER): Payer: Medicaid Other | Admitting: Women's Health

## 2020-02-21 DIAGNOSIS — Z3401 Encounter for supervision of normal first pregnancy, first trimester: Secondary | ICD-10-CM

## 2020-02-21 NOTE — Progress Notes (Signed)
Subjective:  Alexandra Freeman is a 24 y.o. G2P0010 at [redacted]w[redacted]d being seen today for ongoing prenatal care.  She is currently monitored for the following issues for this low-risk pregnancy and has Supervision of low-risk first pregnancy, first trimester on their problem list.  Patient reports no complaints.  Contractions: Not present. Vag. Bleeding: None.  Movement: Absent. Denies leaking of fluid.   The following portions of the patient's history were reviewed and updated as appropriate: allergies, current medications, past family history, past medical history, past social history, past surgical history and problem list. Problem list updated.  Objective:   Vitals:   02/21/20 1455  BP: 121/63  Pulse: 90  Weight: 154 lb 1.6 oz (69.9 kg)    Fetal Status: Fetal Heart Rate (bpm): 150   Movement: Absent     General:  Alert, oriented and cooperative. Patient is in no acute distress.  Skin: Skin is warm and dry. No rash noted.   Cardiovascular: Normal heart rate noted  Respiratory: Normal respiratory effort, no problems with respiration noted  Abdomen: Soft, gravid, appropriate for gestational age. Pain/Pressure: Present     Pelvic: Vag. Bleeding: None     Cervical exam deferred        Extremities: Normal range of motion.  Edema: None  Mental Status: Normal mood and affect. Normal behavior. Normal judgment and thought content.   Urinalysis:      Assessment and Plan:  Pregnancy: G2P0010 at [redacted]w[redacted]d  1. Supervision of low-risk first pregnancy, first trimester - AFP, Serum, Open Spina Bifida   Preterm labor symptoms and general obstetric precautions including but not limited to vaginal bleeding, contractions, leaking of fluid and fetal movement were reviewed in detail with the patient. I discussed the assessment and treatment plan with the patient. The patient was provided an opportunity to ask questions and all were answered. The patient agreed with the plan and demonstrated an understanding of  the instructions. The patient was advised to call back or seek an in-person office evaluation/go to MAU at [facility] for any urgent or concerning symptoms. Please refer to After Visit Summary for other counseling recommendations.  Return in about 4 weeks (around 03/20/2020) for virtual LOB/APP OK.   Gustavo Meditz, Odie Sera, NP

## 2020-02-21 NOTE — Patient Instructions (Addendum)
Maternity Assessment Unit (MAU)  The Maternity Assessment Unit (MAU) is located at the [facility] at [facility]. The address is: [address], [place], [place], [place] [address]. Please see map below for additional directions.    The Maternity Assessment Unit is designed to help you during your pregnancy, and for up to 6 weeks after delivery, with any pregnancy- or postpartum-related emergencies, if you think you are in labor, or if your water has broken. For example, if you experience nausea and vomiting, vaginal bleeding, severe abdominal or pelvic pain, elevated blood pressure or other problems related to your pregnancy or postpartum time, please come to the Maternity Assessment Unit for assistance.        Alpha-Fetoprotein Test Why am I having this test? The alpha-fetoprotein test is most commonly used in pregnant women to help screen for birth defects in their unborn baby. It can be used to screen for birth defects, such as chromosome (DNA) abnormalities, problems with the brain or spinal cord, or problems with the abdominal wall of the unborn baby (fetus). The alpha-fetoprotein test may also be done for men or non-pregnant women to check for certain cancers. What is being tested? This test measures the amount of alpha-fetoprotein (AFP) in your blood. AFP is a protein that is made by the liver. Levels can be detected in the mother's blood during pregnancy, starting at 10 weeks and peaking at 16-18 weeks of the pregnancy. Abnormal levels can sometimes be a sign of a birth defect in the baby. Certain cancers can cause a high level of AFP in men and non-pregnant women. What kind of sample is taken?  A blood sample is required for this test. It is usually collected by inserting a needle into a blood vessel. How are the results reported? Your test results will be reported as values. Your health care provider will compare your results to normal ranges  that were established after testing a large group of people (reference values). Reference values may vary among labs and hospitals. For this test, common reference values are:  Adult: Less than 40 ng/mL or less than 40 mcg/L (SI units).  Child younger than 1 year: Less than 30 ng/mL. If you are pregnant, the values may also vary based on how long you have been pregnant. What do the results mean? Results that are above the reference values in pregnant women may indicate the following for the baby:  Neural tube defects, such as abnormalities of the spinal cord or brain.  Abdominal wall defects.  Multiple pregnancy such as twins.  Fetal distress or fetal death. Results that are above the reference values in men or non-pregnant women may indicate:  Reproductive cancers, such as ovarian or testicular cancer.  Liver cancer.  Liver cell death.  Other types of cancer. Very low levels of AFP in pregnant women may indicate the following for the baby:  Down syndrome.  Fetal death. Talk with your health care provider about what your results mean. Questions to ask your health care provider Ask your health care provider, or the department that is doing the test:  When will my results be ready?  How will I get my results?  What are my treatment options?  What other tests do I need?  What are my next steps? Summary  The alpha-fetoprotein test is done on pregnant women to help screen for birth defects in their unborn baby.  Certain cancers can cause a high level of AFP in  men and non-pregnant women.  For this test, a blood sample is usually collected by inserting a needle into a blood vessel.  Talk with your health care provider about what your results mean. This information is not intended to replace advice given to you by your health care provider. Make sure you discuss any questions you have with your health care provider. Document Revised: 05/01/2017 Document Reviewed:  12/23/2016 Elsevier Patient Education  2020 [employer].        Second Trimester of Pregnancy The second trimester is from week 14 through week 27 (months 4 through 6). The second trimester is often a time when you feel your best. Your body has adjusted to being pregnant, and you begin to feel better physically. Usually, morning sickness has lessened or quit completely, you may have more energy, and you may have an increase in appetite. The second trimester is also a time when the fetus is growing rapidly. At the end of the sixth month, the fetus is about 9 inches long and weighs about 1 pounds. You will likely begin to feel the baby move (quickening) between 16 and 20 weeks of pregnancy. Body changes during your second trimester Your body continues to go through many changes during your second trimester. The changes vary from woman to woman.  Your weight will continue to increase. You will notice your lower abdomen bulging out.  You may begin to get stretch marks on your hips, abdomen, and breasts.  You may develop headaches that can be relieved by medicines. The medicines should be approved by your health care provider.  You may urinate more often because the fetus is pressing on your bladder.  You may develop or continue to have heartburn as a result of your pregnancy.  You may develop constipation because certain hormones are causing the muscles that push waste through your intestines to slow down.  You may develop hemorrhoids or swollen, bulging veins (varicose veins).  You may have back pain. This is caused by: ? Weight gain. ? Pregnancy hormones that are relaxing the joints in your pelvis. ? A shift in weight and the muscles that support your balance.  Your breasts will continue to grow and they will continue to become tender.  Your gums may bleed and may be sensitive to brushing and flossing.  Dark spots or blotches (chloasma, mask of pregnancy) may develop on your  face. This will likely fade after the baby is born.  A dark line from your belly button to the pubic area (linea nigra) may appear. This will likely fade after the baby is born.  You may have changes in your hair. These can include thickening of your hair, rapid growth, and changes in texture. Some women also have hair loss during or after pregnancy, or hair that feels dry or thin. Your hair will most likely return to normal after your baby is born. What to expect at prenatal visits During a routine prenatal visit:  You will be weighed to make sure you and the fetus are growing normally.  Your blood pressure will be taken.  Your abdomen will be measured to track your baby's growth.  The fetal heartbeat will be listened to.  Any test results from the previous visit will be discussed. Your health care provider may ask you:  How you are feeling.  If you are feeling the baby move.  If you have had any abnormal symptoms, such as leaking fluid, bleeding, severe headaches, or abdominal cramping.  If  you are using any tobacco products, including cigarettes, chewing tobacco, and electronic cigarettes.  If you have any questions. Other tests that may be performed during your second trimester include:  Blood tests that check for: ? Low iron levels (anemia). ? High blood sugar that affects pregnant women (gestational diabetes) between 4624 and 28 weeks. ? Rh antibodies. This is to check for a protein on red blood cells (Rh factor).  Urine tests to check for infections, diabetes, or protein in the urine.  An ultrasound to confirm the proper growth and development of the baby.  An amniocentesis to check for possible genetic problems.  Fetal screens for spina bifida and Down syndrome.  HIV (human immunodeficiency virus) testing. Routine prenatal testing includes screening for HIV, unless you choose not to have this test. Follow these instructions at home: Medicines  Follow your health  care provider's instructions regarding medicine use. Specific medicines may be either safe or unsafe to take during pregnancy.  Take a prenatal vitamin that contains at least 600 micrograms (mcg) of folic acid.  If you develop constipation, try taking a stool softener if your health care provider approves. Eating and drinking   Eat a balanced diet that includes fresh fruits and vegetables, whole grains, good sources of protein such as meat, eggs, or tofu, and low-fat dairy. Your health care provider will help you determine the amount of weight gain that is right for you.  Avoid raw meat and uncooked cheese. These carry germs that can cause birth defects in the baby.  If you have low calcium intake from food, talk to your health care provider about whether you should take a daily calcium supplement.  Limit foods that are high in fat and processed sugars, such as fried and sweet foods.  To prevent constipation: ? Drink enough fluid to keep your urine clear or pale yellow. ? Eat foods that are high in fiber, such as fresh fruits and vegetables, whole grains, and beans. Activity  Exercise only as directed by your health care provider. Most women can continue their usual exercise routine during pregnancy. Try to exercise for 30 minutes at least 5 days a week. Stop exercising if you experience uterine contractions.  Avoid heavy lifting, wear low heel shoes, and practice good posture.  A sexual relationship may be continued unless your health care provider directs you otherwise. Relieving pain and discomfort  Wear a good support bra to prevent discomfort from breast tenderness.  Take warm sitz baths to soothe any pain or discomfort caused by hemorrhoids. Use hemorrhoid cream if your health care provider approves.  Rest with your legs elevated if you have leg cramps or low back pain.  If you develop varicose veins, wear support hose. Elevate your feet for 15 minutes, 3-4 times a day. Limit  salt in your diet. Prenatal Care  Write down your questions. Take them to your prenatal visits.  Keep all your prenatal visits as told by your health care provider. This is important. Safety  Wear your seat belt at all times when driving.  Make a list of emergency phone numbers, including numbers for family, friends, the hospital, and police and fire departments. General instructions  Ask your health care provider for a referral to a local prenatal education class. Begin classes no later than the beginning of month 6 of your pregnancy.  Ask for help if you have counseling or nutritional needs during pregnancy. Your health care provider can offer advice or refer you to specialists for  help with various needs.  Do not use hot tubs, steam rooms, or saunas.  Do not douche or use tampons or scented sanitary pads.  Do not cross your legs for long periods of time.  Avoid cat litter boxes and soil used by cats. These carry germs that can cause birth defects in the baby and possibly loss of the fetus by miscarriage or stillbirth.  Avoid all smoking, herbs, alcohol, and unprescribed drugs. Chemicals in these products can affect the formation and growth of the baby.  Do not use any products that contain nicotine or tobacco, such as cigarettes and e-cigarettes. If you need help quitting, ask your health care provider.  Visit your dentist if you have not gone yet during your pregnancy. Use a soft toothbrush to brush your teeth and be gentle when you floss. Contact a health care provider if:  You have dizziness.  You have mild pelvic cramps, pelvic pressure, or nagging pain in the abdominal area.  You have persistent nausea, vomiting, or diarrhea.  You have a bad smelling vaginal discharge.  You have pain when you urinate. Get help right away if:  You have a fever.  You are leaking fluid from your vagina.  You have spotting or bleeding from your vagina.  You have severe abdominal  cramping or pain.  You have rapid weight gain or weight loss.  You have shortness of breath with chest pain.  You notice sudden or extreme swelling of your face, hands, ankles, feet, or legs.  You have not felt your baby move in over an hour.  You have severe headaches that do not go away when you take medicine.  You have vision changes. Summary  The second trimester is from week 14 through week 27 (months 4 through 6). It is also a time when the fetus is growing rapidly.  Your body goes through many changes during pregnancy. The changes vary from woman to woman.  Avoid all smoking, herbs, alcohol, and unprescribed drugs. These chemicals affect the formation and growth your baby.  Do not use any tobacco products, such as cigarettes, chewing tobacco, and e-cigarettes. If you need help quitting, ask your health care provider.  Contact your health care provider if you have any questions. Keep all prenatal visits as told by your health care provider. This is important. This information is not intended to replace advice given to you by your health care provider. Make sure you discuss any questions you have with your health care provider. Document Revised: 09/10/2018 Document Reviewed: 06/24/2016 Elsevier Patient Education  2020 [employer].        Preterm Labor and Birth Information  The normal length of a pregnancy is 39-41 weeks. Preterm labor is when labor starts before 37 completed weeks of pregnancy. What are the risk factors for preterm labor? Preterm labor is more likely to occur in women who:  Have certain infections during pregnancy such as a bladder infection, sexually transmitted infection, or infection inside the uterus (chorioamnionitis).  Have a shorter-than-normal cervix.  Have gone into preterm labor before.  Have had surgery on their cervix.  Are younger than age 65 or older than age 24.  Are African American.  Are pregnant with twins or multiple  babies (multiple gestation).  Take street drugs or smoke while pregnant.  Do not gain enough weight while pregnant.  Became pregnant shortly after having been pregnant. What are the symptoms of preterm labor? Symptoms of preterm labor include:  Cramps similar to those  that can happen during a menstrual period. The cramps may happen with diarrhea.  Pain in the abdomen or lower back.  Regular uterine contractions that may feel like tightening of the abdomen.  A feeling of increased pressure in the pelvis.  Increased watery or bloody mucus discharge from the vagina.  Water breaking (ruptured amniotic sac). Why is it important to recognize signs of preterm labor? It is important to recognize signs of preterm labor because babies who are born prematurely may not be fully developed. This can put them at an increased risk for:  Long-term (chronic) heart and lung problems.  Difficulty immediately after birth with regulating body systems, including blood sugar, body temperature, heart rate, and breathing rate.  Bleeding in the brain.  Cerebral palsy.  Learning difficulties.  Death. These risks are highest for babies who are born before 34 weeks of pregnancy. How is preterm labor treated? Treatment depends on the length of your pregnancy, your condition, and the health of your baby. It may involve:  Having a stitch (suture) placed in your cervix to prevent your cervix from opening too early (cerclage).  Taking or being given medicines, such as: ? Hormone medicines. These may be given early in pregnancy to help support the pregnancy. ? Medicine to stop contractions. ? Medicines to help mature the baby's lungs. These may be prescribed if the risk of delivery is high. ? Medicines to prevent your baby from developing cerebral palsy. If the labor happens before 34 weeks of pregnancy, you may need to stay in the hospital. What should I do if I think I am in preterm labor? If you think  that you are going into preterm labor, call your health care provider right away. How can I prevent preterm labor in future pregnancies? To increase your chance of having a full-term pregnancy:  Do not use any tobacco products, such as cigarettes, chewing tobacco, and e-cigarettes. If you need help quitting, ask your health care provider.  Do not use street drugs or medicines that have not been prescribed to you during your pregnancy.  Talk with your health care provider before taking any herbal supplements, even if you have been taking them regularly.  Make sure you gain a healthy amount of weight during your pregnancy.  Watch for infection. If you think that you might have an infection, get it checked right away.  Make sure to tell your health care provider if you have gone into preterm labor before. This information is not intended to replace advice given to you by your health care provider. Make sure you discuss any questions you have with your health care provider. Document Revised: 09/10/2018 Document Reviewed: 10/10/2015 Elsevier Patient Education  2020 [employer].

## 2020-02-23 LAB — AFP, SERUM, OPEN SPINA BIFIDA
AFP MoM: 1.1
AFP Value: 37.3 ng/mL
Gest. Age on Collection Date: 15.3 weeks
Maternal Age At EDD: 26 yr
OSBR Risk 1 IN: 10000
Test Results:: NEGATIVE
Weight: 153 [lb_av]

## 2020-03-19 ENCOUNTER — Ambulatory Visit: Payer: Medicaid Other

## 2020-03-20 ENCOUNTER — Telehealth (INDEPENDENT_AMBULATORY_CARE_PROVIDER_SITE_OTHER): Payer: Medicaid Other | Admitting: Student

## 2020-03-20 NOTE — Progress Notes (Signed)
Called late at 423pm no answer LVM that I would call back .   Called at 437 pm no answer advised that we will have her rescheduled.

## 2020-03-20 NOTE — Progress Notes (Signed)
Did not keep this virtual appt.

## 2020-03-29 ENCOUNTER — Other Ambulatory Visit: Payer: Self-pay

## 2020-03-29 ENCOUNTER — Other Ambulatory Visit: Payer: Self-pay | Admitting: *Deleted

## 2020-03-29 ENCOUNTER — Ambulatory Visit: Payer: Medicaid Other | Attending: Obstetrics and Gynecology

## 2020-03-29 ENCOUNTER — Other Ambulatory Visit: Payer: Self-pay | Admitting: Obstetrics and Gynecology

## 2020-03-29 DIAGNOSIS — Z3480 Encounter for supervision of other normal pregnancy, unspecified trimester: Secondary | ICD-10-CM | POA: Insufficient documentation

## 2020-03-29 DIAGNOSIS — Z362 Encounter for other antenatal screening follow-up: Secondary | ICD-10-CM

## 2020-04-16 ENCOUNTER — Telehealth (INDEPENDENT_AMBULATORY_CARE_PROVIDER_SITE_OTHER): Payer: Medicaid Other | Admitting: Obstetrics and Gynecology

## 2020-04-16 ENCOUNTER — Encounter: Payer: Self-pay | Admitting: Obstetrics and Gynecology

## 2020-04-16 DIAGNOSIS — M5432 Sciatica, left side: Secondary | ICD-10-CM

## 2020-04-16 DIAGNOSIS — Z3A23 23 weeks gestation of pregnancy: Secondary | ICD-10-CM

## 2020-04-16 DIAGNOSIS — O26892 Other specified pregnancy related conditions, second trimester: Secondary | ICD-10-CM

## 2020-04-16 DIAGNOSIS — R21 Rash and other nonspecific skin eruption: Secondary | ICD-10-CM

## 2020-04-16 DIAGNOSIS — Z3401 Encounter for supervision of normal first pregnancy, first trimester: Secondary | ICD-10-CM

## 2020-04-16 NOTE — Progress Notes (Signed)
   OBSTETRICS PRENATAL VIRTUAL VISIT ENCOUNTER NOTE  Provider location: Center for [facility] at [facility]   I connected with Alexandra Freeman on 04/16/20 at  1:35 PM EST by MyChart Video Encounter at home and verified that I am speaking with the correct person using two identifiers.   I discussed the limitations, risks, security and privacy concerns of performing an evaluation and management service virtually and the availability of in person appointments. I also discussed with the patient that there may be a patient responsible charge related to this service. The patient expressed understanding and agreed to proceed. Subjective:  Alexandra Freeman is a 24 y.o. G2P0010 at [redacted]w[redacted]d being seen today for ongoing prenatal care.  She is currently monitored for the following issues for this low-risk pregnancy and has Supervision of low-risk first pregnancy, first trimester on their problem list.  Patient reports sharp pain that starts at top of left thigh, "under her butt cheek," and shoots towards herr knee, happens randomly, moving leg makes it feel better. Pain is better when she can "shake it out." Denies swelling, warmth, redness. Contractions: Not present. Vag. Bleeding: None.  Movement: Present. Denies any leaking of fluid.   Also reports rash on legs. Non-itching, not irritated, spots on legs, started on 1 leg and now on both legs. Started in June and has gotten progressively worse. Hydrocortisone has not helped. Doesn't bother her.  The following portions of the patient's history were reviewed and updated as appropriate: allergies, current medications, past family history, past medical history, past social history, past surgical history and problem list.   Objective:  There were no vitals filed for this visit.  Fetal Status:     Movement: Present     General:  Alert, oriented and cooperative. Patient is in no acute distress.  Respiratory: Normal respiratory effort, no problems  with respiration noted  Mental Status: Normal mood and affect. Normal behavior. Normal judgment and thought content.  Rest of physical exam deferred due to type of encounter  Imaging: No results found.  Assessment and Plan:  Pregnancy: G2P0010 at [redacted]w[redacted]d  1. Supervision of low-risk first pregnancy, first trimester No issues  2. Sciatic pain, left Sounds like sciatic nerve pain Referral to PT  3. Rash Not worsening significantly Look at in person next visit  Preterm labor symptoms and general obstetric precautions including but not limited to vaginal bleeding, contractions, leaking of fluid and fetal movement were reviewed in detail with the patient. I discussed the assessment and treatment plan with the patient. The patient was provided an opportunity to ask questions and all were answered. The patient agreed with the plan and demonstrated an understanding of the instructions. The patient was advised to call back or seek an in-person office evaluation/go to MAU at [facility] for any urgent or concerning symptoms. Please refer to After Visit Summary for other counseling recommendations.   I provided 15 minutes of face-to-face time during this encounter.  Return in about 4 weeks (around 05/14/2020) for low OB, in person, 2 hr GTT, 3rd trim labs.  Future Appointments  Date Time Provider Department Center  04/25/2020  2:45 PM [facility] US[facility]    Conan Bowens, MD Center for [facility], [facility]

## 2020-04-16 NOTE — Progress Notes (Signed)
I connected with  [name] on 04/16/20 at  1:35 PM EST by telephone and verified that I am speaking with the correct person using two identifiers.   I discussed the limitations, risks, security and privacy concerns of performing an evaluation and management service by telephone and the availability of in person appointments. I also discussed with the patient that there may be a patient responsible charge related to this service. The patient expressed understanding and agreed to proceed.  Marylynn Pearson, RN 04/16/2020  1:38 PM

## 2020-04-16 NOTE — Progress Notes (Signed)
Patient reports lower left leg pain off/on for the past two weeks. Patient

## 2020-04-19 ENCOUNTER — Telehealth: Payer: Self-pay | Admitting: Family Medicine

## 2020-04-19 NOTE — Telephone Encounter (Signed)
Patient called, state she need to speak to someone regarding the Covid inj, has something to do with her job.

## 2020-04-20 NOTE — Telephone Encounter (Signed)
I called Raini and she states she has a job offer but they require all 3 Covid shots. We discussed we do recommend Covid vaccine in pregnancy due to pregnant women are considered high risk; but as with all vaccinations they are some possible side effects and it is her choice whether or not she receives it. We also discussed she will not be able to get all 3 by January as requested but she can the first 2.  We discussed spacing is 3-4 weeks between first and second vaccine, then 6 months later for booster. She voices understanding and thanked me for calling her.   I discussed I might can send information via MyChart but she informed me she is having MyChart issues- I gave her MyChart help desk number.  Lonia Roane,RN

## 2020-04-25 ENCOUNTER — Ambulatory Visit: Payer: Medicaid Other | Attending: Obstetrics

## 2020-04-25 ENCOUNTER — Other Ambulatory Visit: Payer: Self-pay

## 2020-04-25 DIAGNOSIS — O358XX0 Maternal care for other (suspected) fetal abnormality and damage, not applicable or unspecified: Secondary | ICD-10-CM | POA: Diagnosis not present

## 2020-04-25 DIAGNOSIS — Z363 Encounter for antenatal screening for malformations: Secondary | ICD-10-CM | POA: Diagnosis not present

## 2020-04-25 DIAGNOSIS — Z3A24 24 weeks gestation of pregnancy: Secondary | ICD-10-CM | POA: Diagnosis not present

## 2020-04-25 DIAGNOSIS — Z362 Encounter for other antenatal screening follow-up: Secondary | ICD-10-CM | POA: Insufficient documentation

## 2020-05-14 ENCOUNTER — Other Ambulatory Visit: Payer: Medicaid Other

## 2020-05-14 ENCOUNTER — Ambulatory Visit (INDEPENDENT_AMBULATORY_CARE_PROVIDER_SITE_OTHER): Payer: Medicaid Other | Admitting: Obstetrics and Gynecology

## 2020-05-14 ENCOUNTER — Other Ambulatory Visit: Payer: Self-pay

## 2020-05-14 VITALS — BP 119/69 | HR 70

## 2020-05-14 DIAGNOSIS — Z23 Encounter for immunization: Secondary | ICD-10-CM

## 2020-05-14 DIAGNOSIS — Z3480 Encounter for supervision of other normal pregnancy, unspecified trimester: Secondary | ICD-10-CM

## 2020-05-14 DIAGNOSIS — Z3401 Encounter for supervision of normal first pregnancy, first trimester: Secondary | ICD-10-CM

## 2020-05-14 DIAGNOSIS — Z3A27 27 weeks gestation of pregnancy: Secondary | ICD-10-CM

## 2020-05-14 NOTE — Progress Notes (Signed)
28 wk packet given  tdap vaccine today

## 2020-05-14 NOTE — Addendum Note (Signed)
Addended by: Faythe Casa on: 05/14/2020 02:29 PM   Modules accepted: Orders

## 2020-05-14 NOTE — Progress Notes (Signed)
   PRENATAL VISIT NOTE  Subjective:  Alexandra Freeman is a 24 y.o. G2P0010 at [redacted]w[redacted]d being seen today for ongoing prenatal care.  She is currently monitored for the following issues for this low-risk pregnancy and has Supervision of low-risk first pregnancy, first trimester on their problem list.  Patient reports no complaints.  Contractions: Not present. Vag. Bleeding: None.  Movement: Present. Denies leaking of fluid.   The following portions of the patient's history were reviewed and updated as appropriate: allergies, current medications, past family history, past medical history, past social history, past surgical history and problem list.   Objective:   Vitals:   05/14/20 0830  BP: 119/69  Pulse: 70    Fetal Status: Fetal Heart Rate (bpm): 138 Fundal Height: 27 cm Movement: Present     General:  Alert, oriented and cooperative. Patient is in no acute distress.  Skin: Skin is warm and dry. No rash noted.   Cardiovascular: Normal heart rate noted  Respiratory: Normal respiratory effort, no problems with respiration noted  Abdomen: Soft, gravid, appropriate for gestational age.  Pain/Pressure: Present     Pelvic: Cervical exam deferred        Extremities: Normal range of motion.  Edema: None  Mental Status: Normal mood and affect. Normal behavior. Normal judgment and thought content.   Assessment and Plan:  Pregnancy: G2P0010 at [redacted]w[redacted]d 1. Supervision of low-risk first pregnancy, first trimester -tdap given, has already received first dose of covid vaccine, getting second in a week   2. [redacted] weeks gestation of pregnancy -2hr gtt done today, labs done today    Preterm labor symptoms and general obstetric precautions including but not limited to vaginal bleeding, contractions, leaking of fluid and fetal movement were reviewed in detail with the patient. Please refer to After Visit Summary for other counseling recommendations.   Return in about 2 weeks (around 05/28/2020) for OB, any  provider.  No future appointments.  Gita Kudo, MD

## 2020-05-15 LAB — GLUCOSE TOLERANCE, 2 HOURS W/ 1HR
Glucose, 1 hour: 76 mg/dL (ref 65–179)
Glucose, 2 hour: 44 mg/dL — ABNORMAL LOW (ref 65–152)
Glucose, Fasting: 76 mg/dL (ref 65–91)

## 2020-05-15 LAB — CBC
Hematocrit: 32.4 % — ABNORMAL LOW (ref 34.0–46.6)
Hemoglobin: 11.2 g/dL (ref 11.1–15.9)
MCH: 32.4 pg (ref 26.6–33.0)
MCHC: 34.6 g/dL (ref 31.5–35.7)
MCV: 94 fL (ref 79–97)
Platelets: 172 10*3/uL (ref 150–450)
RBC: 3.46 x10E6/uL — ABNORMAL LOW (ref 3.77–5.28)
RDW: 12.9 % (ref 11.7–15.4)
WBC: 10.1 10*3/uL (ref 3.4–10.8)

## 2020-05-15 LAB — HIV ANTIBODY (ROUTINE TESTING W REFLEX): HIV Screen 4th Generation wRfx: NONREACTIVE

## 2020-05-15 LAB — RPR: RPR Ser Ql: NONREACTIVE

## 2020-05-28 ENCOUNTER — Other Ambulatory Visit: Payer: Self-pay

## 2020-05-28 ENCOUNTER — Encounter: Payer: Self-pay | Admitting: Certified Nurse Midwife

## 2020-05-28 ENCOUNTER — Ambulatory Visit (INDEPENDENT_AMBULATORY_CARE_PROVIDER_SITE_OTHER): Payer: Medicaid Other | Admitting: Certified Nurse Midwife

## 2020-05-28 VITALS — BP 125/63 | HR 89 | Wt 177.1 lb

## 2020-05-28 DIAGNOSIS — Z3A29 29 weeks gestation of pregnancy: Secondary | ICD-10-CM

## 2020-05-28 DIAGNOSIS — Z3401 Encounter for supervision of normal first pregnancy, first trimester: Secondary | ICD-10-CM

## 2020-05-28 DIAGNOSIS — R21 Rash and other nonspecific skin eruption: Secondary | ICD-10-CM

## 2020-05-28 NOTE — Progress Notes (Signed)
   PRENATAL VISIT NOTE  Subjective:  Alexandra Freeman is a 24 y.o. G2P0010 at [redacted]w[redacted]d being seen today for ongoing prenatal care.  She is currently monitored for the following issues for this low-risk pregnancy and has Supervision of low-risk first pregnancy, first trimester on their problem list.  Patient reports no complaints.  Contractions: Not present. Vag. Bleeding: None.  Movement: Present. Denies leaking of fluid.   The following portions of the patient's history were reviewed and updated as appropriate: allergies, current medications, past family history, past medical history, past social history, past surgical history and problem list.   Objective:   Vitals:   05/28/20 1018  BP: 125/63  Pulse: 89  Weight: 177 lb 1.6 oz (80.3 kg)    Fetal Status: Fetal Heart Rate (bpm): 147 Fundal Height: 28 cm Movement: Present     General:  Alert, oriented and cooperative. Patient is in no acute distress.  Skin: Skin is warm and dry. No rash noted.   Cardiovascular: Normal heart rate noted  Respiratory: Normal respiratory effort, no problems with respiration noted  Abdomen: Soft, gravid, appropriate for gestational age.  Pain/Pressure: Present     Pelvic: Cervical exam deferred        Extremities: Normal range of motion.  Edema: None  Mental Status: Normal mood and affect. Normal behavior. Normal judgment and thought content.   Assessment and Plan:  Pregnancy: G2P0010 at [redacted]w[redacted]d 1. Supervision of low-risk first pregnancy, first trimester - Patient doing well, no complaints - Routine prenatal care - Anticipatory guidance on upcoming appointments  - Reviewed 3rd trimester labs with patient and passing GTT   2. [redacted] weeks gestation of pregnancy  3. Rash - Patient denies irritation or itching  - Hyperpigmentation, not worsening significantly    Preterm labor symptoms and general obstetric precautions including but not limited to vaginal bleeding, contractions, leaking of fluid and fetal  movement were reviewed in detail with the patient. Please refer to After Visit Summary for other counseling recommendations.   Return in about 16 days (around 06/13/2020) for LROB, virtual.  Future Appointments  Date Time Provider Department Center  06/14/2020  8:15 AM Bernerd Limbo, CNM [facility]    Sharyon Cable, CNM

## 2020-05-28 NOTE — Patient Instructions (Signed)

## 2020-06-02 NOTE — L&D Delivery Note (Signed)
OB/GYN Faculty Practice Delivery Note  Alexandra Freeman is a 25 y.o. [phone] s/p SVD at [redacted]w[redacted]d, admitted for augmentation of latent labor given new diagnosis of gHTN and Category 2 strip in MAU on arrival.  ROM: 2h 55m with 150 fluid GBS Status: positive Maximum Maternal Temperature:  97.73F  Labor Progress: On arrival, pt found to be in latent labor. FB was placed and pitocin was started at 1445. Pt was found to have complete cervical dilation at 1740 at which time AROM for clear fluid was performed. She labored down for 1 hour and then had an uncomplicated delivery s/p a short second stage as noted below.  Delivery Date/Time: 08/06/20 at 1951 Delivery: Called to room and patient was complete and pushing. Head delivered ROA. No nuchal cord present. Shoulder and body delivered in usual fashion. Infant with spontaneous cry, placed on mother's abdomen, dried and stimulated. Cord clamped x 2 after 1-minute delay, and cut by grandmother under my direct supervision. Cord blood drawn. Placenta delivered spontaneously with gentle cord traction. Fundus firm with massage and Pitocin. Labia, perineum, vagina, and cervix were inspected, notable for 2nd degree peroneal laceration s/p repair in standard fashion with use of 3-0 vicryl.  Placenta: intact, 3-vessel cord, sent to L&D Complications: none Lacerations: 2nd degree laceration s/p repair as noted above EBL: 140 ml Analgesia: epidural  Postpartum Planning Infant: Healthy female  APGARs 9 and 9  TBD weight  [name] MD, PGY1   I was gloved and present for the delivery of infant and placenta. I performed perineal laceration repair as noted above.  Sheila Oats, MD OB Fellow, Faculty Practice 08/06/2020 8:56 PM

## 2020-06-14 ENCOUNTER — Ambulatory Visit (INDEPENDENT_AMBULATORY_CARE_PROVIDER_SITE_OTHER): Payer: Medicaid Other | Admitting: Certified Nurse Midwife

## 2020-06-14 ENCOUNTER — Telehealth (INDEPENDENT_AMBULATORY_CARE_PROVIDER_SITE_OTHER): Payer: Medicaid Other | Admitting: Certified Nurse Midwife

## 2020-06-14 ENCOUNTER — Other Ambulatory Visit: Payer: Self-pay

## 2020-06-14 VITALS — BP 119/79 | HR 87 | Wt 183.1 lb

## 2020-06-14 DIAGNOSIS — O26893 Other specified pregnancy related conditions, third trimester: Secondary | ICD-10-CM

## 2020-06-14 DIAGNOSIS — R102 Pelvic and perineal pain: Secondary | ICD-10-CM

## 2020-06-14 DIAGNOSIS — Z3483 Encounter for supervision of other normal pregnancy, third trimester: Secondary | ICD-10-CM

## 2020-06-14 DIAGNOSIS — K580 Irritable bowel syndrome with diarrhea: Secondary | ICD-10-CM | POA: Insufficient documentation

## 2020-06-14 DIAGNOSIS — Z3A31 31 weeks gestation of pregnancy: Secondary | ICD-10-CM

## 2020-06-14 MED ORDER — COMFORT FIT MATERNITY SUPP SM MISC: 1.0000 [IU] | Freq: Every day | 0 refills | Status: DC | PRN

## 2020-06-14 MED ORDER — MAG-OXIDE 200 MG PO TABS: 400.0000 mg | ORAL_TABLET | Freq: Every day | ORAL | 3 refills | Status: DC

## 2020-06-14 MED ORDER — CYCLOBENZAPRINE HCL 10 MG PO TABS: 10.0000 mg | ORAL_TABLET | Freq: Every evening | ORAL | 1 refills | Status: DC | PRN

## 2020-06-14 NOTE — Progress Notes (Signed)
I connected with  Alexandra Freeman on 06/14/20 at 0822 by telephone and verified that I am speaking with the correct person using two identifiers.   I discussed the limitations, risks, security and privacy concerns of performing an evaluation and management service by telephone and the availability of in person appointments. I also discussed with the patient that there may be a patient responsible charge related to this service. The patient expressed understanding and agreed to proceed.  Sent MyChart link to patient via text. Pt unable to join the visit. Called pt back and offered in person visit later this morning. Pt agreeable; plans to come to the office around 1000.   Marjo Bicker, RN 06/14/2020  8:22 AM

## 2020-06-14 NOTE — Patient Instructions (Signed)
   PREGNANCY SUPPORT BELT: You are not alone, Seventy-five percent of women have some sort of abdominal or back pain at some point in their pregnancy. Your baby is growing at a fast pace, which means that your whole body is rapidly trying to adjust to the changes. As your uterus grows, your back may start feeling a bit under stress and this can result in back or abdominal pain that can go from mild, and therefore bearable, to severe pains that will not allow you to sit or lay down comfortably, When it comes to dealing with pregnancy-related pains and cramps, some pregnant women usually prefer natural remedies, which the market is filled with nowadays. For example, wearing a pregnancy support belt can help ease and lessen your discomfort and pain. WHAT ARE THE BENEFITS OF WEARING A PREGNANCY SUPPORT BELT? A pregnancy support belt provides support to the lower portion of the belly taking some of the weight of the growing uterus and distributing to the other parts of your body. It is designed make you comfortable and gives you extra support. Over the years, the pregnancy apparel market has been studying the needs and wants of pregnant women and they have come up with the most comfortable pregnancy support belts that woman could ever ask for. In fact, you will no longer have to wear a stretched-out or bulky pregnancy belt that is visible underneath your clothes and makes you feel even more uncomfortable. Nowadays, a pregnancy support belt is made of comfortable and stretchy materials that will not irritate your skin but will actually make you feel at ease and you will not even notice you are wearing it. They are easy to put on and adjust during the day and can be worn at night for additional support.  BENEFITS: . Relives Back pain . Relieves Abdominal Muscle and Leg Pain . Stabilizes the Pelvic Ring . Offers a Cushioned Abdominal Lift Pad . Relieves pressure on the Sciatic Nerve Within Minutes WHERE TO GET  YOUR PREGNANCY BELT:  [facility] [address]  [place] [address] [phone]  [facility] [address] [place] [address] [phone]

## 2020-06-14 NOTE — Progress Notes (Signed)
Virtual visit attempted but could not be completed. Pt rescheduled for in-person later in the morning.

## 2020-06-14 NOTE — Progress Notes (Signed)
   PRENATAL VISIT NOTE  Subjective:  Alexandra Freeman is a 25 y.o. G2P0010 at [redacted]w[redacted]d being seen today for ongoing prenatal care.  She is currently monitored for the following issues for this low-risk pregnancy and has Supervision of low-risk first pregnancy, first trimester and Irritable bowel syndrome with diarrhea on their problem list.  Patient reports pelvic pain and pressure. She states it has been getting worse throughout the pregnancy, she feels the baby is very low causing vaginal pressure/pain that is worst when walking or standing for long periods of time. Also having this pain/pressure at night. Not taking anything for the pain, says baths help but are hard to get in and out of.  Contractions: Not present.  .  Movement: Present. Denies leaking of fluid.   The following portions of the patient's history were reviewed and updated as appropriate: allergies, current medications, past family history, past medical history, past social history, past surgical history and problem list.   Objective:   Vitals:   06/14/20 1050  BP: 119/79  Pulse: 87  Weight: 183 lb 1.9 oz (83.1 kg)    Fetal Status: Fetal Heart Rate (bpm): 145 Fundal Height: 32 cm Movement: Present     General:  Alert, oriented and cooperative. Patient is in no acute distress.  Skin: Skin is warm and dry. No rash noted.   Cardiovascular: Normal heart rate noted  Respiratory: Normal respiratory effort, no problems with respiration noted  Abdomen: Soft, gravid, appropriate for gestational age.  Pain/Pressure: Present     Pelvic: Cervical exam deferred        Extremities: Normal range of motion.  Edema: None  Mental Status: Normal mood and affect. Normal behavior. Normal judgment and thought content.   Assessment and Plan:  Pregnancy: G2P0010 at [redacted]w[redacted]d 1. Encounter for supervision of other normal pregnancy in third trimester - Doing well other than the pain/pressure, feeling fetal movement  2. [redacted] weeks gestation of  pregnancy - Routine OB care - Anticipatory guidance given about future visits  3. Pelvic pressure in pregnancy, antepartum, third trimester - Discussed stretches/positioning to relieve pain/pressure. Encouraged use of maternity belt and nightly magnesium, adding flexeril at bedtime if needed - Elastic Bandages & Supports (COMFORT FIT MATERNITY SUPP SM) MISC; 1 Units by Does not apply route daily as needed.  Dispense: 1 each; Refill: 0 - Magnesium Oxide (MAG-OXIDE) 200 MG TABS; Take 2 tablets (400 mg total) by mouth at bedtime. If that amount causes loose stools in the am, switch to 200mg  daily at bedtime.  Dispense: 60 tablet; Refill: 3 - cyclobenzaprine (FLEXERIL) 10 MG tablet; Take 1 tablet (10 mg total) by mouth at bedtime as needed for muscle spasms.  Dispense: 30 tablet; Refill: 1  Preterm labor symptoms and general obstetric precautions including but not limited to vaginal bleeding, contractions, leaking of fluid and fetal movement were reviewed in detail with the patient. Please refer to After Visit Summary for other counseling recommendations.   Return in about 2 weeks (around 06/28/2020) for IN-PERSON, LOB.  Future Appointments  Date Time Provider Department Center  07/02/2020  1:35 PM 07/04/2020, MD [facility]   [facility] CNM, MSN, [facility] Certified Nurse Midwife, [facility]

## 2020-07-02 ENCOUNTER — Telehealth (INDEPENDENT_AMBULATORY_CARE_PROVIDER_SITE_OTHER): Payer: Medicaid Other | Admitting: Obstetrics & Gynecology

## 2020-07-02 ENCOUNTER — Encounter: Payer: Self-pay | Admitting: Obstetrics & Gynecology

## 2020-07-02 DIAGNOSIS — Z3A34 34 weeks gestation of pregnancy: Secondary | ICD-10-CM

## 2020-07-02 NOTE — Progress Notes (Signed)
Called pt at 1339; VM left stating I am calling to check pt in for virtual appt and will call again in 15 minutes. Requested that pt return call or log into MyChart.   Called pt at 1403; VM left stating pt will need to call the office to reschedule. Callback number given. Front office notified pt no showed.  Fleet Contras RN 07/02/20

## 2020-07-02 NOTE — Progress Notes (Signed)
Pt did not show for her virtual visi.

## 2020-07-09 ENCOUNTER — Telehealth (INDEPENDENT_AMBULATORY_CARE_PROVIDER_SITE_OTHER): Payer: BC Managed Care – PPO | Admitting: Obstetrics & Gynecology

## 2020-07-09 VITALS — Wt 192.0 lb

## 2020-07-09 DIAGNOSIS — O99891 Other specified diseases and conditions complicating pregnancy: Secondary | ICD-10-CM

## 2020-07-09 DIAGNOSIS — O99820 Streptococcus B carrier state complicating pregnancy: Secondary | ICD-10-CM

## 2020-07-09 DIAGNOSIS — Z3A35 35 weeks gestation of pregnancy: Secondary | ICD-10-CM

## 2020-07-09 DIAGNOSIS — M5432 Sciatica, left side: Secondary | ICD-10-CM | POA: Diagnosis not present

## 2020-07-09 DIAGNOSIS — R8271 Bacteriuria: Secondary | ICD-10-CM

## 2020-07-09 DIAGNOSIS — O98313 Other infections with a predominantly sexual mode of transmission complicating pregnancy, third trimester: Secondary | ICD-10-CM | POA: Diagnosis not present

## 2020-07-09 DIAGNOSIS — A5901 Trichomonal vulvovaginitis: Secondary | ICD-10-CM | POA: Diagnosis not present

## 2020-07-09 DIAGNOSIS — Z3401 Encounter for supervision of normal first pregnancy, first trimester: Secondary | ICD-10-CM

## 2020-07-09 DIAGNOSIS — O23591 Infection of other part of genital tract in pregnancy, first trimester: Secondary | ICD-10-CM

## 2020-07-09 NOTE — Progress Notes (Signed)
I connected with Alexandra Freeman 07/09/20 at  3:55 PM EST by: MyChart video and verified that I am speaking with the correct person using two identifiers.  Patient is located in her care and provider is located at [facility]     The purpose of this virtual visit is to provide medical care while limiting exposure to the novel coronavirus. I discussed the limitations, risks, security and privacy concerns of performing an evaluation and management service by MyChart video and the availability of in person appointments. I also discussed with the patient that there may be a patient responsible charge related to this service. By engaging in this virtual visit, you consent to the provision of healthcare.  Additionally, you authorize for your insurance to be billed for the services provided during this visit.  The patient expressed understanding and agreed to proceed.  The following staff members participated in the virtual visit:  Gershon Crane, RN    PRENATAL VISIT NOTE  Subjective:  Alexandra Freeman is a 25 y.o. G2P0010 at [redacted]w[redacted]d  for phone visit for ongoing prenatal care.  She is currently monitored for the following issues for this low-risk pregnancy and has Supervision of low-risk first pregnancy, first trimester and Irritable bowel syndrome with diarrhea on their problem list.  Patient reports right buttocks, upper leg pain.  Contractions: Not present. Vag. Bleeding: None.  Movement: Present. Denies leaking of fluid.   Pt did not take BP today but denies HA or visual changes.  She is having some mild swelling in her ankles.  The following portions of the patient's history were reviewed and updated as appropriate: allergies, current medications, past family history, past medical history, past social history, past surgical history and problem list.   Objective:   Vitals:   07/09/20 1620  Weight: 192 lb (87.1 kg)   Self-Obtained Pt did not take BP today.   Fetal Status:     Movement: Present      Assessment and Plan:  Pregnancy: G2P0010 at [redacted]w[redacted]d 1. [redacted] weeks gestation of pregnancy - on PNV  2. Supervision of low-risk first pregnancy, first trimester - return 1 weeks.  Gc/Chl will be done.  Recommend repeat testing for trich for TOC as well.  D/w pt.  3. Sciatic pain, left  4. Group B streptococcal bacteriuria - treatment in labor discussed with pt  5. Trichomonal vaginitis during pregnancy in first trimester - TOC will be done with cultures next week   Preterm labor symptoms and general obstetric precautions including but not limited to vaginal bleeding, contractions, leaking of fluid and fetal movement were reviewed in detail with the patient.  Return in about 1 week (around 07/16/2020) for Office ob visit (MD or APP), GC/Chl/GBS.  No future appointments.  Time spent on virtual visit: 15 minutes  Jerene Bears, MD

## 2020-07-09 NOTE — Progress Notes (Signed)
I connected with  Alexandra Freeman on 07/09/20 at  3:55 PM EST by telephone and verified that I am speaking with the correct person using two identifiers.   I discussed the limitations, risks, security and privacy concerns of performing an evaluation and management service by telephone and the availability of in person appointments. I also discussed with the patient that there may be a patient responsible charge related to this service. The patient expressed understanding and agreed to proceed.  Isabell Jarvis, RN 07/09/2020  4:18 PM

## 2020-07-16 ENCOUNTER — Encounter: Payer: Medicaid Other | Admitting: Obstetrics and Gynecology

## 2020-07-18 ENCOUNTER — Ambulatory Visit (INDEPENDENT_AMBULATORY_CARE_PROVIDER_SITE_OTHER): Payer: Medicaid Other | Admitting: Nurse Practitioner

## 2020-07-18 ENCOUNTER — Other Ambulatory Visit (HOSPITAL_COMMUNITY)
Admission: RE | Admit: 2020-07-18 | Discharge: 2020-07-18 | Disposition: A | Discharge status: Home / Self Care | Payer: BC Managed Care – PPO | Source: Ambulatory Visit | Attending: Obstetrics and Gynecology | Admitting: Obstetrics and Gynecology

## 2020-07-18 ENCOUNTER — Other Ambulatory Visit: Payer: Self-pay

## 2020-07-18 VITALS — BP 129/85 | HR 86 | Wt 197.1 lb

## 2020-07-18 DIAGNOSIS — Z3401 Encounter for supervision of normal first pregnancy, first trimester: Secondary | ICD-10-CM | POA: Insufficient documentation

## 2020-07-18 DIAGNOSIS — Z3403 Encounter for supervision of normal first pregnancy, third trimester: Secondary | ICD-10-CM | POA: Insufficient documentation

## 2020-07-18 DIAGNOSIS — Z3A36 36 weeks gestation of pregnancy: Secondary | ICD-10-CM

## 2020-07-18 DIAGNOSIS — R8271 Bacteriuria: Secondary | ICD-10-CM

## 2020-07-18 NOTE — Patient Instructions (Signed)

## 2020-07-18 NOTE — Progress Notes (Signed)
    Subjective:  Alexandra Freeman is a 25 y.o. G2P0010 at [redacted]w[redacted]d being seen today for ongoing prenatal care.  She is currently monitored for the following issues for this low-risk pregnancy and has Supervision of low-risk first pregnancy, first trimester and Irritable bowel syndrome with diarrhea on their problem list.  Patient reports legs swelling R>L.  Contractions: Not present. Vag. Bleeding: None.  Movement: Present. Denies leaking of fluid.   The following portions of the patient's history were reviewed and updated as appropriate: allergies, current medications, past family history, past medical history, past social history, past surgical history and problem list. Problem list updated.  Objective:   Vitals:   07/18/20 1110  BP: 129/85  Pulse: 86  Weight: 197 lb 1.6 oz (89.4 kg)    Fetal Status: Fetal Heart Rate (bpm): 142 Fundal Height: 36 cm Movement: Present  Presentation: Vertex  General:  Alert, oriented and cooperative. Patient is in no acute distress.  Skin: Skin is warm and dry. No rash noted.   Cardiovascular: Normal heart rate noted  Respiratory: Normal respiratory effort, no problems with respiration noted  Abdomen: Soft, gravid, appropriate for gestational age. Pain/Pressure: Present     Pelvic:  Cervical exam performed Dilation: 1 Effacement (%): Thick Station: -3  Extremities: Normal range of motion.  Edema: Trace  Mental Status: Normal mood and affect. Normal behavior. Normal judgment and thought content.   Urinalysis:      Assessment and Plan:  Pregnancy: G2P0010 at [redacted]w[redacted]d  1. Supervision of low-risk first pregnancy, first trimester Wants to change her appointments to [facility] as she is off on Mondays Having some swelling in lower legs - ankles are trace, swelling is worst in her leg with many tatoos (right).  Advised using compression knee highs to control edema that develops while sitting at work. Needs maternity leave paperwork done - advised to leave at front  desk  - Cervicovaginal ancillary only( [place])  2. Group B streptococcal bacteriuria Already identified in pregnancy - no testing needed today No allergies Reviewed IV antibiotics used in labor to treat GBS   Term labor symptoms and general obstetric precautions including but not limited to vaginal bleeding, contractions, leaking of fluid and fetal movement were reviewed in detail with the patient. Please refer to After Visit Summary for other counseling recommendations.  Return in about 5 days (around 07/23/2020) for ROB in person.  Nolene Bernheim, RN, MSN, NP-BC Nurse Practitioner, [facility] for [employer], [facility] 07/18/2020 8:10 PM

## 2020-07-19 LAB — CERVICOVAGINAL ANCILLARY ONLY
Chlamydia: NEGATIVE
Comment: NEGATIVE
Comment: NEGATIVE
Comment: NORMAL
Neisseria Gonorrhea: NEGATIVE
Trichomonas: NEGATIVE

## 2020-07-25 ENCOUNTER — Ambulatory Visit (INDEPENDENT_AMBULATORY_CARE_PROVIDER_SITE_OTHER): Payer: Medicaid Other | Admitting: Advanced Practice Midwife

## 2020-07-25 ENCOUNTER — Inpatient Hospital Stay (HOSPITAL_COMMUNITY)
Admission: AD | Admit: 2020-07-25 | Discharge: 2020-07-25 | Disposition: A | Discharge status: Home / Self Care | Payer: BC Managed Care – PPO | Attending: Obstetrics and Gynecology | Admitting: Obstetrics and Gynecology

## 2020-07-25 ENCOUNTER — Encounter (HOSPITAL_COMMUNITY): Payer: Self-pay | Admitting: Obstetrics and Gynecology

## 2020-07-25 ENCOUNTER — Other Ambulatory Visit: Payer: Self-pay

## 2020-07-25 VITALS — BP 145/96 | HR 106 | Wt 201.5 lb

## 2020-07-25 DIAGNOSIS — Z3A37 37 weeks gestation of pregnancy: Secondary | ICD-10-CM | POA: Diagnosis not present

## 2020-07-25 DIAGNOSIS — Z3401 Encounter for supervision of normal first pregnancy, first trimester: Secondary | ICD-10-CM

## 2020-07-25 DIAGNOSIS — O26893 Other specified pregnancy related conditions, third trimester: Secondary | ICD-10-CM | POA: Insufficient documentation

## 2020-07-25 DIAGNOSIS — R030 Elevated blood-pressure reading, without diagnosis of hypertension: Secondary | ICD-10-CM | POA: Insufficient documentation

## 2020-07-25 DIAGNOSIS — O163 Unspecified maternal hypertension, third trimester: Secondary | ICD-10-CM | POA: Diagnosis not present

## 2020-07-25 DIAGNOSIS — Z3689 Encounter for other specified antenatal screening: Secondary | ICD-10-CM

## 2020-07-25 LAB — PROTEIN / CREATININE RATIO, URINE
Creatinine, Urine: 70.2 mg/dL
Protein Creatinine Ratio: 0.13 mg/mg{Cre} (ref 0.00–0.15)
Total Protein, Urine: 9 mg/dL

## 2020-07-25 LAB — CBC
HCT: 32.7 % — ABNORMAL LOW (ref 36.0–46.0)
Hemoglobin: 10.7 g/dL — ABNORMAL LOW (ref 12.0–15.0)
MCH: 30.9 pg (ref 26.0–34.0)
MCHC: 32.7 g/dL (ref 30.0–36.0)
MCV: 94.5 fL (ref 80.0–100.0)
Platelets: 164 10*3/uL (ref 150–400)
RBC: 3.46 MIL/uL — ABNORMAL LOW (ref 3.87–5.11)
RDW: 13.9 % (ref 11.5–15.5)
WBC: 11.1 10*3/uL — ABNORMAL HIGH (ref 4.0–10.5)
nRBC: 0 % (ref 0.0–0.2)

## 2020-07-25 LAB — COMPREHENSIVE METABOLIC PANEL
ALT: 15 U/L (ref 0–44)
AST: 22 U/L (ref 15–41)
Albumin: 2.7 g/dL — ABNORMAL LOW (ref 3.5–5.0)
Alkaline Phosphatase: 145 U/L — ABNORMAL HIGH (ref 38–126)
Anion gap: 10 (ref 5–15)
BUN: 5 mg/dL — ABNORMAL LOW (ref 6–20)
CO2: 20 mmol/L — ABNORMAL LOW (ref 22–32)
Calcium: 8.6 mg/dL — ABNORMAL LOW (ref 8.9–10.3)
Chloride: 107 mmol/L (ref 98–111)
Creatinine, Ser: 0.67 mg/dL (ref 0.44–1.00)
GFR, Estimated: >60 mL/min (ref >60)
Glucose, Bld: 96 mg/dL (ref 70–99)
Potassium: 3.7 mmol/L (ref 3.5–5.1)
Sodium: 137 mmol/L (ref 135–145)
Total Bilirubin: 0.1 mg/dL — ABNORMAL LOW (ref 0.3–1.2)
Total Protein: 6 g/dL — ABNORMAL LOW (ref 6.5–8.1)

## 2020-07-25 NOTE — Progress Notes (Signed)
   PRENATAL VISIT NOTE  Subjective:  Alexandra Freeman is a 25 y.o. G2P0010 at [redacted]w[redacted]d being seen today for ongoing prenatal care.  She is currently monitored for the following issues for this low-risk pregnancy and has Supervision of low-risk first pregnancy, first trimester and Irritable bowel syndrome with diarrhea on their problem list.  Patient reports no complaints.  Contractions: Not present. Vag. Bleeding: None.  Movement: Present. Denies leaking of fluid.   The following portions of the patient's history were reviewed and updated as appropriate: allergies, current medications, past family history, past medical history, past social history, past surgical history and problem list.   Objective:   Vitals:   07/25/20 1024 07/25/20 1051  BP: (!) 151/83 (!) 145/96  Pulse: (!) 104 (!) 106  Weight: 201 lb 8 oz (91.4 kg)     Fetal Status: Fetal Heart Rate (bpm): 142 Fundal Height: 37 cm Movement: Present     General:  Alert, oriented and cooperative. Patient is in no acute distress.  Skin: Skin is warm and dry. No rash noted.   Cardiovascular: Normal heart rate noted  Respiratory: Normal respiratory effort, no problems with respiration noted  Abdomen: Soft, gravid, appropriate for gestational age.  Pain/Pressure: Absent     Pelvic: Cervical exam deferred        Extremities: Normal range of motion.  Edema: Mild pitting, slight indentation  Mental Status: Normal mood and affect. Normal behavior. Normal judgment and thought content.   Assessment and Plan:  Pregnancy: G2P0010 at [redacted]w[redacted]d 1. Supervision of low-risk first pregnancy, first trimester   2. [redacted] weeks gestation of pregnancy  3. Hypertension in Pregnancy - Patient with new onset hypertension at > 37 weeks - To MAU for evaluation.  Term labor symptoms and general obstetric precautions including but not limited to vaginal bleeding, contractions, leaking of fluid and fetal movement were reviewed in detail with the patient. Please  refer to After Visit Summary for other counseling recommendations.   Return in about 1 week (around 08/01/2020).  No future appointments.  Thressa Sheller DNP, CNM  07/25/20  10:57 AM

## 2020-07-25 NOTE — Discharge Instructions (Signed)
Hypertension During Pregnancy Hypertension is also called high blood pressure. High blood pressure means that the force of the blood moving in your body is high enough to cause problems for you and your baby. Different types of high blood pressure can happen during pregnancy. The types are:  High blood pressure before you got pregnant. This is called chronic hypertension.  This can continue during your pregnancy. Your doctor will want to keep checking your blood pressure. You may need medicine to control your blood pressure while you are pregnant. You will need follow-up visits after you have your baby.  High blood pressure that goes up during pregnancy when it was normal before. This is called gestational hypertension. It will often get better after you have your baby, but your doctor will need to watch your blood pressure to make sure that it is getting better.  You may develop high blood pressure after giving birth. This is called postpartum hypertension. This often occurs within 48 hours after childbirth but may occur up to 6 weeks after giving birth. Very high blood pressure during pregnancy is an emergency that needs treatment right away. How does this affect me? If you have high blood pressure during pregnancy, you have a higher chance of developing high blood pressure:  As you get older.  If you get pregnant again. In some cases, high blood pressure during pregnancy can cause:  Stroke.  Heart attack.  Damage to the kidneys, lungs, or liver.  Preeclampsia.  HELLP syndrome.  Seizures.  Problems with the placenta. How does this affect my baby? Your baby may:  Be born early.  Not weigh as much as he or she should.  Not handle labor well, leading to a C-section. This condition may also result in a baby's death before birth (stillbirth). What are the risks?  Having high blood pressure during a past pregnancy.  Being overweight.  Being age 35 or older.  Being pregnant  for the first time.  Being pregnant with more than one baby.  Becoming pregnant using fertility methods, such as IVF.  Having other problems, such as diabetes or kidney disease. What can I do to lower my risk?  Keep a healthy weight.  Eat a healthy diet.  Follow what your doctor tells you about treating any medical problems that you had before you got pregnant. It is very important to go to all of your doctor visits. Your doctor will check your blood pressure and make sure that your pregnancy is progressing as it should. Treatment should start early if a problem is found.   How is this treated? Treatment for high blood pressure during pregnancy can vary. It depends on the type of high blood pressure you have and how serious it is.  If you were taking medicine for your blood pressure before you got pregnant, talk with your doctor. You may need to change the medicine during pregnancy if it is not safe for your baby.  If your blood pressure goes up during pregnancy, your doctor may order medicine to treat this.  If you are at risk for preeclampsia, your doctor may tell you to take a low-dose aspirin while you are pregnant.  If you have very high blood pressure, you may need to stay in the hospital so you and your baby can be watched closely. You may also need to take medicine to lower your blood pressure.  In some cases, if your condition gets worse, you may need to have your baby early.   Follow these instructions at home: Eating and drinking  Drink enough fluid to keep your pee (urine) pale yellow.  Avoid caffeine.   Lifestyle  Do not smoke or use any products that contain nicotine or tobacco. If you need help quitting, ask your doctor.  Do not use alcohol or drugs.  Avoid stress.  Rest and get plenty of sleep.  Regular exercise can help. Ask your doctor what kinds of exercise are best for you. General instructions  Take over-the-counter and prescription medicines only as  told by your doctor.  Keep all prenatal and follow-up visits. Contact a doctor if:  You have symptoms that your doctor told you to watch for, such as: ? Headaches. ? A feeling like you may vomit (nausea). ? Vomiting. ? Belly (abdominal) pain. ? Feeling dizzy or light-headed. Get help right away if:  You have symptoms of serious problems, such as: ? Very bad belly pain that does not get better with treatment. ? A very bad headache that does not get better. ? Blurry vision. ? Double vision. ? Vomiting that does not get better. ? Sudden, fast weight gain. ? Sudden swelling in your hands, ankles, or face. ? Bleeding from your vagina. ? Blood in your pee. ? Shortness of breath. ? Chest pain. ? Weakness on one side of your body. ? Trouble talking.  Your baby is not moving as much as usual. These symptoms may be an emergency. Get help right away. Call your local emergency services (911 in the [place]  Do not wait to see if the symptoms will go away.  Do not drive yourself to the hospital. Summary  High blood pressure is also called hypertension.  High blood pressure means that the force of the blood moving in your body is high enough to cause problems for you and your baby.  Get help right away if you have symptoms of serious problems due to high blood pressure.  Keep all prenatal and follow-up visits. This information is not intended to replace advice given to you by your health care provider. Make sure you discuss any questions you have with your health care provider. Document Revised: 02/09/2020 Document Reviewed: 02/09/2020 Elsevier Patient Education  2021 Elsevier Inc.  

## 2020-07-25 NOTE — MAU Provider Note (Signed)
Chief Complaint:  Hypertension   Event Date/Time   First Provider Initiated Contact with Patient 07/25/20 1232     HPI: Alexandra Freeman is a 25 y.o. G2P0010 at 46w4dwho [facility] elevated blood pressures at her appointment this morning. She denies headache, visual disturbances, epigastric or flank pain. She is having increased bilateral lower extremity edema. Denies vaginal bleeding/discharge, contractions or decreased fetal movement.   [facility] Past Medical History:  Diagnosis Date  . Medical history non-contributory   . Menorrhagia    OB History  Gravida Para Term Preterm AB Living  2 0 0 0 1 0  SAB IAB Ectopic Multiple Live Births  0 1 0 0 0    # Outcome Date GA Lbr Len/2nd Weight Sex Delivery Anes PTL Lv  2 Current           1 IAB            Past Surgical History:  Procedure Laterality Date  . NO PAST SURGERIES     Family History  Problem Relation Age of Onset  . Polymyositis Mother    Social History   Tobacco Use  . Smoking status: Never Smoker  . Smokeless tobacco: Never Used  Vaping Use  . Vaping Use: Never used  Substance Use Topics  . Alcohol use: No  . Drug use: No   No Known Allergies No medications prior to admission.   I have reviewed patient's Past Medical Hx, Surgical Hx, Family Hx, Social Hx, medications and allergies.   ROS:  Review of Systems  Constitutional: Negative for fatigue and fever.  HENT: Negative for congestion and sore throat.   Eyes: Negative for visual disturbance.  Respiratory: Negative for cough and shortness of breath.   Cardiovascular: Positive for leg swelling. Negative for chest pain.  Gastrointestinal: Negative for abdominal pain, nausea and vomiting.  Genitourinary: Negative for flank pain, pelvic pain, vaginal bleeding and vaginal discharge.  Neurological: Negative for syncope and headaches.  All other systems  reviewed and are negative.  Physical Exam   Patient Vitals for the past 24 hrs:  BP Temp Temp src Pulse Resp SpO2 Height Weight  07/25/20 1345 137/67 -- -- 87 -- 97 % -- --  07/25/20 1338 140/89 -- -- (!) 105 -- -- -- --  07/25/20 1300 140/81 -- -- 94 -- 100 % -- --  07/25/20 1245 139/78 -- -- 95 -- 99 % -- --  07/25/20 1230 138/85 -- -- 96 -- 99 % -- --  07/25/20 1215 (!) 143/84 -- -- 95 -- 99 % -- --  07/25/20 1206 139/77 98.4 F (36.9 C) Oral 99 16 99 % 5' 5"  (1.651 m) 201 lb 8 oz (91.4 kg)   Constitutional: Well-developed, well-nourished female in no acute distress.  Cardiovascular: normal rate & rhythm, no murmur Respiratory: normal effort, lung sounds clear throughout GI: Abd soft, non-tender, gravid appropriate for gestational age. Pos BS x 4 MS: Extremities nontender, +1 pitting edema in both legs from mid-calf down, normal ROM Neurologic: Alert and oriented x 4.  GU: no CVA tenderness Pelvic exam deferred  Fetal Tracing: reactive Baseline: 135 Variability: moderate Accelerations: present Decelerations: none Toco: q15-258m, not felt by pt   Labs: Results for orders placed or performed during the hospital encounter of 07/25/20 (from the past 24 hour(s))  Protein / creatinine ratio, urine     Status: None   Collection Time:  07/25/20 12:03 PM  Result Value Ref Range   Creatinine, Urine 70.20 mg/dL   Total Protein, Urine 9 mg/dL   Protein Creatinine Ratio 0.13 0.00 - 0.15 mg/mg[Cre]  CBC     Status: Abnormal   Collection Time: 07/25/20 12:21 PM  Result Value Ref Range   WBC 11.1 (H) 4.0 - 10.5 K/uL   RBC 3.46 (L) 3.87 - 5.11 MIL/uL   Hemoglobin 10.7 (L) 12.0 - 15.0 g/dL   HCT 32.7 (L) 36.0 - 46.0 %   MCV 94.5 80.0 - 100.0 fL   MCH 30.9 26.0 - 34.0 pg   MCHC 32.7 30.0 - 36.0 g/dL   RDW 13.9 11.5 - 15.5 %   Platelets 164 150 - 400 K/uL   nRBC 0.0 0.0 - 0.2 %  Comprehensive metabolic panel     Status: Abnormal   Collection Time: 07/25/20 12:21 PM  Result Value  Ref Range   Sodium 137 135 - 145 mmol/L   Potassium 3.7 3.5 - 5.1 mmol/L   Chloride 107 98 - 111 mmol/L   CO2 20 (L) 22 - 32 mmol/L   Glucose, Bld 96 70 - 99 mg/dL   BUN 5 (L) 6 - 20 mg/dL   Creatinine, Ser 0.67 0.44 - 1.00 mg/dL   Calcium 8.6 (L) 8.9 - 10.3 mg/dL   Total Protein 6.0 (L) 6.5 - 8.1 g/dL   Albumin 2.7 (L) 3.5 - 5.0 g/dL   AST 22 15 - 41 U/L   ALT 15 0 - 44 U/L   Alkaline Phosphatase 145 (H) 38 - 126 U/L   Total Bilirubin 0.1 (L) 0.3 - 1.2 mg/dL   GFR, Estimated >60 >60 mL/min   Anion gap 10 5 - 15   Imaging:  No results found.  MAU Course: Orders Placed This Encounter  Procedures  . Protein / creatinine ratio, urine  . CBC  . Comprehensive metabolic panel  . Discharge patient   No orders of the defined types were placed in this encounter.  MDM: Preeclampsia labs ordered, discussed gestational hypertension effects on pregnancy, delivery planning and antenatal testing with pt.  Labs normal, pt amenable to repeat BP check in the office on Friday morning  Assessment: 1. Elevated blood pressure affecting pregnancy in third trimester, antepartum   2. NST (non-stress test) reactive    Plan: Discharge home in stable condition with 3rd trimester and preeclampsia precautions.     Follow-up [facility] for [facility]. Go to.   Specialty: Obstetrics and Gynecology Why: on Friday for BP check Contact information: [place] [address] [phone]              Allergies as of 07/25/2020   No Known Allergies     Medication List    TAKE these medications   Blood Pressure Kit Devi 1 Device by Does not apply route as needed.   Comfort Fit Maternity Supp Sm Misc 1 Units by Does not apply route daily as needed.   multivitamin-prenatal 27-0.8 MG Tabs tablet Take 1 tablet by mouth daily at 12 noon.       Gaylan Gerold, CNM, MSN, [place] Certified Nurse Midwife, [facility]

## 2020-07-25 NOTE — MAU Note (Signed)
Pt sent over from routine prenatal visit due to elevated b/p. Denies headache or blurred vision. Reports lower extremity swelling. Reports good fetal movement

## 2020-07-26 ENCOUNTER — Encounter (HOSPITAL_BASED_OUTPATIENT_CLINIC_OR_DEPARTMENT_OTHER): Payer: Self-pay

## 2020-07-27 ENCOUNTER — Ambulatory Visit (INDEPENDENT_AMBULATORY_CARE_PROVIDER_SITE_OTHER): Payer: Medicaid Other | Admitting: *Deleted

## 2020-07-27 ENCOUNTER — Other Ambulatory Visit: Payer: Self-pay

## 2020-07-27 VITALS — BP 136/87 | HR 97 | Ht 65.0 in | Wt 201.3 lb

## 2020-07-27 DIAGNOSIS — Z0130 Encounter for examination of blood pressure without abnormal findings: Secondary | ICD-10-CM

## 2020-07-27 NOTE — Progress Notes (Signed)
Pt had MAU visit on 2/23 for R/O pre-eclampsia due to elevated BP during prenatal visit. Labs reviewed and were normal.  Pt is here for BP check - 136/87, P - 76.  She reports occasional H/A however none today. She denies visual disturbances. Pt was advised to continue to check BP periodically and especially if she develops sx of pre-e. She has schedule Ob f/u visit on 3/2. Pt voiced understanding of all information and instructions given.

## 2020-07-31 ENCOUNTER — Encounter: Payer: Self-pay | Admitting: Nurse Practitioner

## 2020-07-31 DIAGNOSIS — R8271 Bacteriuria: Secondary | ICD-10-CM | POA: Insufficient documentation

## 2020-07-31 DIAGNOSIS — O133 Gestational [pregnancy-induced] hypertension without significant proteinuria, third trimester: Secondary | ICD-10-CM | POA: Insufficient documentation

## 2020-07-31 DIAGNOSIS — O139 Gestational [pregnancy-induced] hypertension without significant proteinuria, unspecified trimester: Secondary | ICD-10-CM | POA: Insufficient documentation

## 2020-08-01 ENCOUNTER — Ambulatory Visit (INDEPENDENT_AMBULATORY_CARE_PROVIDER_SITE_OTHER): Payer: Medicaid Other | Admitting: Nurse Practitioner

## 2020-08-01 ENCOUNTER — Other Ambulatory Visit: Payer: Self-pay

## 2020-08-01 ENCOUNTER — Encounter: Payer: Self-pay | Admitting: Family Medicine

## 2020-08-01 VITALS — BP 135/81 | HR 90 | Wt 203.9 lb

## 2020-08-01 DIAGNOSIS — Z3A38 38 weeks gestation of pregnancy: Secondary | ICD-10-CM

## 2020-08-01 DIAGNOSIS — Z3401 Encounter for supervision of normal first pregnancy, first trimester: Secondary | ICD-10-CM

## 2020-08-01 LAB — POCT URINALYSIS DIP (DEVICE)
Bilirubin Urine: NEGATIVE
Glucose, UA: NEGATIVE mg/dL
Hgb urine dipstick: NEGATIVE
Ketones, ur: NEGATIVE mg/dL
Leukocytes,Ua: NEGATIVE
Nitrite: NEGATIVE
Protein, ur: NEGATIVE mg/dL
Specific Gravity, Urine: 1.02 (ref 1.005–1.030)
Urobilinogen, UA: 0.2 mg/dL (ref 0.0–1.0)
pH: 7 (ref 5.0–8.0)

## 2020-08-01 NOTE — Progress Notes (Signed)
Pt reports feeling dizzy and had H/A yesterday but did not check BP. Today she denies H/A and visual disturbances.

## 2020-08-01 NOTE — Patient Instructions (Signed)

## 2020-08-01 NOTE — Progress Notes (Signed)
Patient was assessed and managed by nursing staff during this encounter. I have reviewed the chart and agree with the documentation and plan. I have also made any necessary editorial changes.  Deavin Forst, MD 08/01/2020 9:58 AM   

## 2020-08-01 NOTE — Progress Notes (Signed)
    Subjective:  Alexandra Freeman is a 25 y.o. G2P0010 at [redacted]w[redacted]d being seen today for ongoing prenatal care.  She is currently monitored for the following issues for this low-risk pregnancy and has Supervision of low-risk first pregnancy, first trimester; Irritable bowel syndrome with diarrhea; and Group B streptococcal bacteriuria on their problem list.  Patient reports feeling tired and did not go to work for the past 2 days.  Contractions: Irregular. Vag. Bleeding: None.  Movement: Present. Denies leaking of fluid.   The following portions of the patient's history were reviewed and updated as appropriate: allergies, current medications, past family history, past medical history, past social history, past surgical history and problem list. Problem list updated.  Objective:   Vitals:   08/01/20 1448  BP: 135/81  Pulse: 90  Weight: 203 lb 14.4 oz (92.5 kg)    Fetal Status: Fetal Heart Rate (bpm): 138 Fundal Height: 40 cm Movement: Present     General:  Alert, oriented and cooperative. Patient is in no acute distress.  Skin: Skin is warm and dry. No rash noted.   Cardiovascular: Normal heart rate noted  Respiratory: Normal respiratory effort, no problems with respiration noted  Abdomen: Soft, gravid, appropriate for gestational age. Pain/Pressure: Present     Pelvic:  Cervical exam deferred        Extremities: Normal range of motion.  Edema: Moderate pitting, indentation subsides rapidly  Mental Status: Normal mood and affect. Normal behavior. Normal judgment and thought content.   Urinalysis:      Assessment and Plan:  Pregnancy: G2P0010 at [redacted]w[redacted]d  1. Supervision of low-risk first pregnancy, first trimester Reviewed past notes, labs and reviewed plan of care with Dr. Donavan Foil - close but does not meet criteria for gestational hypertension - will continue to observe. Has checked bp at home but no high pressures and is not recording BP and is not checking daily Advised to check BP daily  and send MyChart message to the clinic with BP - does not use babyscripts. Reviewed BP that is too high 140/90 (either value) and would go to MAU for evaluation Reviewed Preeclampsia symptoms - headache, blurred vision, worsening edema, and RUQ pain - go to MAU if these occur. No proteinuria on urine dipstick today. Will see again in one week and client aware of symptoms that means her blood pressure is worsening. Baby is moving very well. And advised movement should be good daily. Patient is very tired and has lower leg and ankle edema and wants to be out of work.  Has missed past 2 days of work before today as she has not felt good.  Letter for out of work to cover past 2 days through Saturday put into check out note.  - Urinalysis, dipstick only; Future   Term labor symptoms and general obstetric precautions including but not limited to vaginal bleeding, contractions, leaking of fluid and fetal movement were reviewed in detail with the patient. Please refer to After Visit Summary for other counseling recommendations.  Return in about 1 week (around 08/08/2020) for in person ROB.  Nolene Bernheim, RN, MSN, NP-BC Nurse Practitioner, [facility] for [employer], [facility] 08/01/2020 3:32 PM

## 2020-08-06 ENCOUNTER — Encounter (HOSPITAL_COMMUNITY): Payer: Self-pay | Admitting: Obstetrics & Gynecology

## 2020-08-06 ENCOUNTER — Inpatient Hospital Stay (HOSPITAL_COMMUNITY): Payer: BC Managed Care – PPO | Admitting: Anesthesiology

## 2020-08-06 ENCOUNTER — Inpatient Hospital Stay (HOSPITAL_COMMUNITY)
Admission: AD | Admit: 2020-08-06 | Discharge: 2020-08-08 | DRG: 807 | Disposition: A | Discharge status: Home / Self Care | Payer: BC Managed Care – PPO | Attending: Obstetrics & Gynecology | Admitting: Obstetrics & Gynecology

## 2020-08-06 ENCOUNTER — Other Ambulatory Visit: Payer: Self-pay

## 2020-08-06 DIAGNOSIS — O9962 Diseases of the digestive system complicating childbirth: Secondary | ICD-10-CM | POA: Diagnosis present

## 2020-08-06 DIAGNOSIS — O99824 Streptococcus B carrier state complicating childbirth: Secondary | ICD-10-CM | POA: Diagnosis not present

## 2020-08-06 DIAGNOSIS — Z3A39 39 weeks gestation of pregnancy: Secondary | ICD-10-CM

## 2020-08-06 DIAGNOSIS — R8271 Bacteriuria: Secondary | ICD-10-CM | POA: Diagnosis present

## 2020-08-06 DIAGNOSIS — O26893 Other specified pregnancy related conditions, third trimester: Secondary | ICD-10-CM | POA: Diagnosis not present

## 2020-08-06 DIAGNOSIS — K580 Irritable bowel syndrome with diarrhea: Secondary | ICD-10-CM | POA: Diagnosis present

## 2020-08-06 DIAGNOSIS — O134 Gestational [pregnancy-induced] hypertension without significant proteinuria, complicating childbirth: Principal | ICD-10-CM | POA: Diagnosis present

## 2020-08-06 DIAGNOSIS — Z20822 Contact with and (suspected) exposure to covid-19: Secondary | ICD-10-CM | POA: Diagnosis present

## 2020-08-06 LAB — COMPREHENSIVE METABOLIC PANEL
ALT: 14 U/L (ref 0–44)
AST: 27 U/L (ref 15–41)
Albumin: 3 g/dL — ABNORMAL LOW (ref 3.5–5.0)
Alkaline Phosphatase: 172 U/L — ABNORMAL HIGH (ref 38–126)
Anion gap: 11 (ref 5–15)
BUN: 9 mg/dL (ref 6–20)
CO2: 17 mmol/L — ABNORMAL LOW (ref 22–32)
Calcium: 8.8 mg/dL — ABNORMAL LOW (ref 8.9–10.3)
Chloride: 105 mmol/L (ref 98–111)
Creatinine, Ser: 0.75 mg/dL (ref 0.44–1.00)
GFR, Estimated: >60 mL/min (ref >60)
Glucose, Bld: 81 mg/dL (ref 70–99)
Potassium: 3.7 mmol/L (ref 3.5–5.1)
Sodium: 133 mmol/L — ABNORMAL LOW (ref 135–145)
Total Bilirubin: 0.4 mg/dL (ref 0.3–1.2)
Total Protein: 6.7 g/dL (ref 6.5–8.1)

## 2020-08-06 LAB — CBC WITH DIFFERENTIAL/PLATELET
Abs Immature Granulocytes: 0.09 10*3/uL — ABNORMAL HIGH (ref 0.00–0.07)
Basophils Absolute: 0 10*3/uL (ref 0.0–0.1)
Basophils Relative: 0 %
Eosinophils Absolute: 0 10*3/uL (ref 0.0–0.5)
Eosinophils Relative: 0 %
HCT: 32.7 % — ABNORMAL LOW (ref 36.0–46.0)
Hemoglobin: 11.2 g/dL — ABNORMAL LOW (ref 12.0–15.0)
Immature Granulocytes: 1 %
Lymphocytes Relative: 8 %
Lymphs Abs: 1.3 10*3/uL (ref 0.7–4.0)
MCH: 31.4 pg (ref 26.0–34.0)
MCHC: 34.3 g/dL (ref 30.0–36.0)
MCV: 91.6 fL (ref 80.0–100.0)
Monocytes Absolute: 0.7 10*3/uL (ref 0.1–1.0)
Monocytes Relative: 4 %
Neutro Abs: 15 10*3/uL — ABNORMAL HIGH (ref 1.7–7.7)
Neutrophils Relative %: 87 %
Platelets: 203 10*3/uL (ref 150–400)
RBC: 3.57 MIL/uL — ABNORMAL LOW (ref 3.87–5.11)
RDW: 14 % (ref 11.5–15.5)
WBC: 17.2 10*3/uL — ABNORMAL HIGH (ref 4.0–10.5)
nRBC: 0 % (ref 0.0–0.2)

## 2020-08-06 LAB — CBC
HCT: 33.6 % — ABNORMAL LOW (ref 36.0–46.0)
Hemoglobin: 11.5 g/dL — ABNORMAL LOW (ref 12.0–15.0)
MCH: 31.9 pg (ref 26.0–34.0)
MCHC: 34.2 g/dL (ref 30.0–36.0)
MCV: 93.3 fL (ref 80.0–100.0)
Platelets: 189 10*3/uL (ref 150–400)
RBC: 3.6 MIL/uL — ABNORMAL LOW (ref 3.87–5.11)
RDW: 14.4 % (ref 11.5–15.5)
WBC: 8.6 10*3/uL (ref 4.0–10.5)
nRBC: 0 % (ref 0.0–0.2)

## 2020-08-06 LAB — RESP PANEL BY RT-PCR (FLU A&B, COVID) ARPGX2
Influenza A by PCR: NEGATIVE
Influenza B by PCR: NEGATIVE
SARS Coronavirus 2 by RT PCR: NEGATIVE

## 2020-08-06 LAB — TYPE AND SCREEN
ABO/RH(D): A POS
Antibody Screen: NEGATIVE

## 2020-08-06 LAB — POCT FERN TEST: POCT Fern Test: NEGATIVE

## 2020-08-06 LAB — PROTEIN / CREATININE RATIO, URINE
Creatinine, Urine: 103.04 mg/dL
Protein Creatinine Ratio: 0.16 mg/mg{Cre} — ABNORMAL HIGH (ref 0.00–0.15)
Total Protein, Urine: 16 mg/dL

## 2020-08-06 MED ORDER — PHENYLEPHRINE 40 MCG/ML (10ML) SYRINGE FOR IV PUSH (FOR BLOOD PRESSURE SUPPORT): 80.0000 ug | PREFILLED_SYRINGE | INTRAVENOUS | Status: DC | PRN

## 2020-08-06 MED ORDER — LACTATED RINGERS IV SOLN: 500.0000 mL | INTRAVENOUS | Status: DC | PRN

## 2020-08-06 MED ORDER — SODIUM CHLORIDE 0.9 % IV SOLN
5.0000 10*6.[IU] | Freq: Once | INTRAVENOUS | Status: AC
  Administered 2020-08-06: 5 10*6.[IU] via INTRAVENOUS
  Filled 2020-08-06: qty 5

## 2020-08-06 MED ORDER — PRENATAL MULTIVITAMIN CH
1.0000 | ORAL_TABLET | Freq: Every day | ORAL | Status: DC
  Administered 2020-08-07: 1 via ORAL
  Filled 2020-08-06: qty 1

## 2020-08-06 MED ORDER — ACETAMINOPHEN 325 MG PO TABS
650.0000 mg | ORAL_TABLET | ORAL | Status: DC | PRN
Start: 2020-08-06 — End: 2020-08-06

## 2020-08-06 MED ORDER — DIPHENHYDRAMINE HCL 25 MG PO CAPS: 25.0000 mg | ORAL_CAPSULE | Freq: Four times a day (QID) | ORAL | Status: DC | PRN

## 2020-08-06 MED ORDER — LACTATED RINGERS IV SOLN: 500.0000 mL | Freq: Once | INTRAVENOUS | Status: DC

## 2020-08-06 MED ORDER — SOD CITRATE-CITRIC ACID 500-334 MG/5ML PO SOLN: 30.0000 mL | ORAL | Status: DC | PRN

## 2020-08-06 MED ORDER — FENTANYL-BUPIVACAINE-NACL 0.5-0.125-0.9 MG/250ML-% EP SOLN
EPIDURAL | Status: AC
  Filled 2020-08-06: qty 250

## 2020-08-06 MED ORDER — FENTANYL CITRATE (PF) 100 MCG/2ML IJ SOLN
INTRAMUSCULAR | Status: AC
  Filled 2020-08-06: qty 2

## 2020-08-06 MED ORDER — FENTANYL-BUPIVACAINE-NACL 0.5-0.125-0.9 MG/250ML-% EP SOLN
12.0000 mL/h | EPIDURAL | Status: DC | PRN
  Administered 2020-08-06: 12 mL/h via EPIDURAL

## 2020-08-06 MED ORDER — LACTATED RINGERS IV SOLN: INTRAVENOUS | Status: DC

## 2020-08-06 MED ORDER — COCONUT OIL OIL: 1.0000 "application " | TOPICAL_OIL | Status: DC | PRN

## 2020-08-06 MED ORDER — ONDANSETRON HCL 4 MG/2ML IJ SOLN: 4.0000 mg | Freq: Four times a day (QID) | INTRAMUSCULAR | Status: DC | PRN

## 2020-08-06 MED ORDER — PENICILLIN G POT IN DEXTROSE 60000 UNIT/ML IV SOLN
3.0000 10*6.[IU] | INTRAVENOUS | Status: DC
  Administered 2020-08-06: 3 10*6.[IU] via INTRAVENOUS
  Filled 2020-08-06 (×2): qty 50

## 2020-08-06 MED ORDER — OXYTOCIN-SODIUM CHLORIDE 30-0.9 UT/500ML-% IV SOLN
1.0000 m[IU]/min | INTRAVENOUS | Status: DC
  Administered 2020-08-06: 2 m[IU]/min via INTRAVENOUS

## 2020-08-06 MED ORDER — BENZOCAINE-MENTHOL 20-0.5 % EX AERO: 1.0000 "application " | INHALATION_SPRAY | CUTANEOUS | Status: DC | PRN

## 2020-08-06 MED ORDER — SENNOSIDES-DOCUSATE SODIUM 8.6-50 MG PO TABS
2.0000 | ORAL_TABLET | Freq: Every day | ORAL | Status: DC
  Administered 2020-08-07: 2 via ORAL
  Filled 2020-08-06 (×2): qty 2

## 2020-08-06 MED ORDER — TETANUS-DIPHTH-ACELL PERTUSSIS 5-2.5-18.5 LF-MCG/0.5 IM SUSY: 0.5000 mL | PREFILLED_SYRINGE | Freq: Once | INTRAMUSCULAR | Status: DC

## 2020-08-06 MED ORDER — ONDANSETRON HCL 4 MG PO TABS: 4.0000 mg | ORAL_TABLET | ORAL | Status: DC | PRN

## 2020-08-06 MED ORDER — LIDOCAINE HCL (PF) 1 % IJ SOLN
INTRAMUSCULAR | Status: DC | PRN
  Administered 2020-08-06: 3 mL via EPIDURAL
  Administered 2020-08-06: 2 mL via EPIDURAL
  Administered 2020-08-06: 5 mL via EPIDURAL

## 2020-08-06 MED ORDER — AMMONIA AROMATIC IN INHA
RESPIRATORY_TRACT | Status: AC
  Filled 2020-08-06: qty 10

## 2020-08-06 MED ORDER — FENTANYL CITRATE (PF) 100 MCG/2ML IJ SOLN
INTRAMUSCULAR | Status: AC
  Administered 2020-08-06: 100 ug
  Filled 2020-08-06: qty 2

## 2020-08-06 MED ORDER — DIPHENHYDRAMINE HCL 50 MG/ML IJ SOLN
12.5000 mg | INTRAMUSCULAR | Status: DC | PRN
Start: 2020-08-06 — End: 2020-08-06

## 2020-08-06 MED ORDER — OXYCODONE-ACETAMINOPHEN 5-325 MG PO TABS: 1.0000 | ORAL_TABLET | ORAL | Status: DC | PRN

## 2020-08-06 MED ORDER — ONDANSETRON HCL 4 MG/2ML IJ SOLN: 4.0000 mg | INTRAMUSCULAR | Status: DC | PRN

## 2020-08-06 MED ORDER — OXYTOCIN BOLUS FROM INFUSION
333.0000 mL | Freq: Once | INTRAVENOUS | Status: AC
  Administered 2020-08-06: 333 mL via INTRAVENOUS

## 2020-08-06 MED ORDER — EPHEDRINE 5 MG/ML INJ: 10.0000 mg | INTRAVENOUS | Status: DC | PRN

## 2020-08-06 MED ORDER — WITCH HAZEL-GLYCERIN EX PADS: 1.0000 "application " | MEDICATED_PAD | CUTANEOUS | Status: DC | PRN

## 2020-08-06 MED ORDER — LIDOCAINE HCL (PF) 1 % IJ SOLN: 30.0000 mL | INTRAMUSCULAR | Status: DC | PRN

## 2020-08-06 MED ORDER — IBUPROFEN 600 MG PO TABS
600.0000 mg | ORAL_TABLET | Freq: Four times a day (QID) | ORAL | Status: DC
  Administered 2020-08-06 – 2020-08-08 (×6): 600 mg via ORAL
  Filled 2020-08-06 (×6): qty 1

## 2020-08-06 MED ORDER — ACETAMINOPHEN 325 MG PO TABS
650.0000 mg | ORAL_TABLET | Freq: Four times a day (QID) | ORAL | Status: DC
  Administered 2020-08-06 – 2020-08-08 (×6): 650 mg via ORAL
  Filled 2020-08-06 (×6): qty 2

## 2020-08-06 MED ORDER — OXYTOCIN-SODIUM CHLORIDE 30-0.9 UT/500ML-% IV SOLN
2.5000 [IU]/h | INTRAVENOUS | Status: DC
  Administered 2020-08-06: 2.5 [IU]/h via INTRAVENOUS
  Filled 2020-08-06 (×2): qty 500

## 2020-08-06 MED ORDER — SIMETHICONE 80 MG PO CHEW: 80.0000 mg | CHEWABLE_TABLET | ORAL | Status: DC | PRN

## 2020-08-06 MED ORDER — OXYCODONE-ACETAMINOPHEN 5-325 MG PO TABS
2.0000 | ORAL_TABLET | ORAL | Status: DC | PRN
Start: 2020-08-06 — End: 2020-08-06

## 2020-08-06 MED ORDER — DIBUCAINE (PERIANAL) 1 % EX OINT: 1.0000 "application " | TOPICAL_OINTMENT | CUTANEOUS | Status: DC | PRN

## 2020-08-06 MED ORDER — OXYTOCIN-SODIUM CHLORIDE 30-0.9 UT/500ML-% IV SOLN: 1.0000 m[IU]/min | INTRAVENOUS | Status: DC

## 2020-08-06 NOTE — MAU Note (Signed)
Presents with ctxs that began @ 0600 this morning, reports ctxs are 5-8 minutes apart.  Denies VB, pinkish stringy discharge only.  Unsure if LOF.  Endorses +FM.

## 2020-08-06 NOTE — Progress Notes (Deleted)
OB/GYN Faculty Practice Delivery Note  Alexandra Freeman is a 25 y.o. [id number] s/p SVD at [redacted]w[redacted]d.    ROM: 2h 69m with 150 fluid GBS Status: positive Maximum Maternal Temperature:  97.8   Labor Progress: . Patient progressed to labor well and delivered healthy newborn boy without complications   Delivery Date/Time: 08/06/20 at 1951 Delivery: Called to room and patient was complete and pushing. Head delivered. No nuchal cord present. Shoulder and body delivered in usual fashion. Infant with spontaneous cry, placed on mother's abdomen, dried and stimulated. Cord clamped x 2 after 1-minute delay, and cut by grandmother under my direct supervision. Cord blood drawn. Placenta delivered spontaneously with gentle cord traction. Fundus firm with massage and Pitocin. Labia, perineum, vagina, and cervix were inspected, one 2nd degree peroneal laceration.   Placenta: intact Complications: none Lacerations: 1 peroneal 2nd degree laceration  EBL: 140 ml Analgesia: epidural  Postpartum Planning Infant: Healthy female  APGARs 9 and 9  TBD weight  [name] MD, PGY1

## 2020-08-06 NOTE — Lactation Note (Signed)
This note was copied from a baby's chart. Lactation Consultation Note  Patient Name: Alexandra Freeman MBWGY'K Date: 08/06/2020 Reason for consult: L&D Initial assessment Age:26 hours  L&D consult with 59 minutes old infant and P1 mother. Mother and grandmothers are present at time of consult. Congratulated them on their newborn. Discussed STS as ideal transition for infants after birth helping with temperature, blood sugar and comfort. Mother states they have been doing a lot of skin to skin. Talked about primal reflexes such as rooting, hands to mouth, searching for the breast among others.   No latch or hand expression assistance at this time. Explained LC services availability during postpartum stay. Thanked family for their time.    Maternal Data Has patient been taught Hand Expression?: No Does the patient have breastfeeding experience prior to this delivery?: No  Feeding Mother's Current Feeding Choice: Breast Milk and Formula  Interventions Interventions: Skin to skin;Expressed milk;Education;Breast feeding basics reviewed  Consult Status Consult Status: Follow-up Date: 08/06/20 Follow-up type: In-patient    Katiana Ruland A Higuera Ancidey 08/06/2020, 8:32 PM

## 2020-08-06 NOTE — Discharge Summary (Addendum)
Postpartum Discharge Summary  Date of Service updated 08/08/20   Patient Name: Alexandra Freeman DOB: 1995/12/24 MRN: [id number]  Date of admission: 08/06/2020 Delivery date:08/06/2020  Delivering provider: Randa Ngo  Date of discharge: 08/08/2020  Admitting diagnosis: Supervision of low-risk pregnancy, third trimester [Z34.93] Intrauterine pregnancy: [redacted]w[redacted]d    Secondary diagnosis:  Principal Problem:   Vaginal delivery Active Problems:   Irritable bowel syndrome with diarrhea   Group B streptococcal bacteriuria  Additional problems: as noted above Discharge diagnosis: Term Pregnancy Delivered                                              Post partum procedures: None  Augmentation: AROM, Pitocin and IP Foley Complications: None  Hospital course: Onset of Labor With Vaginal Delivery      25y.o. yo G2P0010 at 355w2das admitted in Latent Labor on 08/06/2020. Given new diagnosis of gHTN and Category 2 strip in MAU, pt's labor was augmented with foley balloon, pitocin and AROM. Patient had an uncomplicated labor course as follows:  Membrane Rupture Time/Date: 5:40 PM ,08/06/2020   Delivery Method:Vaginal, Spontaneous  Episiotomy: None  Lacerations:  2nd degree  Patient had an uncomplicated postpartum course.  She is ambulating, tolerating a regular diet, passing flatus, and urinating well. Patient is discharged home in stable condition on 08/08/20.  Newborn Data: Birth date:08/06/2020  Birth time:7:51 PM  Gender:Female  Living status:Living  Apgars:9 ,9  Weight:3311 g   Magnesium Sulfate received: No BMZ received: No Rhophylac:N/A MMR:N/A T-DaP:Given prenatally Flu: No  Transfusion:No  Physical exam  Vitals:   08/07/20 1122 08/07/20 1439 08/07/20 2126 08/08/20 0510  BP: 140/70 140/81 133/76 136/88  Pulse: 90 75 85 77  Resp: _0 Temp: 97.8 F (36.6 C) 98.6 F (37 C) 98.3 F (36.8 C) 98.2 F (36.8 C)  TempSrc: Oral Oral Oral Oral  SpO2:  99% 100% 100%  Weight:       Height:       General: alert Lochia: appropriate Uterine Fundus: firm Incision: Healing well with no significant drainage DVT Evaluation: No evidence of DVT seen on physical exam. Labs: Lab Results  Component Value Date   WBC 17.2 (H) 08/06/2020   HGB 11.2 (L) 08/06/2020   HCT 32.7 (L) 08/06/2020   MCV 91.6 08/06/2020   PLT 203 08/06/2020   CMP Latest Ref Rng & Units 08/06/2020  Glucose 70 - 99 mg/dL 81  BUN 6 - 20 mg/dL 9  Creatinine 0.44 - 1.00 mg/dL 0.75  Sodium 135 - 145 mmol/L 133(L)  Potassium 3.5 - 5.1 mmol/L 3.7  Chloride 98 - 111 mmol/L 105  CO2 22 - 32 mmol/L 17(L)  Calcium 8.9 - 10.3 mg/dL 8.8(L)  Total Protein 6.5 - 8.1 g/dL 6.7  Total Bilirubin 0.3 - 1.2 mg/dL 0.4  Alkaline Phos 38 - 126 U/L 172(H)  AST 15 - 41 U/L 27  ALT 0 - 44 U/L 14   Edinburgh Score: Edinburgh Postnatal Depression Scale Screening Tool 08/07/2020  I have been able to laugh and see the funny side of things. 0  I have looked forward with enjoyment to things. 0  I have blamed myself unnecessarily when things went wrong. 0  I have been anxious or worried for no good reason. 0  I have felt scared or panicky for no good  reason. 0  Things have been getting on top of me. 0  I have been so unhappy that I have had difficulty sleeping. 0  I have felt sad or miserable. 0  I have been so unhappy that I have been crying. 0  The thought of harming myself has occurred to me. 0  Edinburgh Postnatal Depression Scale Total 0     After visit meds:  Allergies as of 08/08/2020   No Known Allergies     Medication List    TAKE these medications   acetaminophen 500 MG tablet Commonly known as: TYLENOL Take 2 tablets (1,000 mg total) by mouth every 6 (six) hours.   amLODipine 10 MG tablet Commonly known as: NORVASC Take 1 tablet (10 mg total) by mouth daily.   Blood Pressure Kit Devi 1 Device by Does not apply route as needed.   coconut oil Oil Apply 1 application topically as needed.    Comfort Fit Maternity Supp Sm Misc 1 Units by Does not apply route daily as needed.   ibuprofen 600 MG tablet Commonly known as: ADVIL Take 1 tablet (600 mg total) by mouth every 6 (six) hours as needed.   prenatal multivitamin Tabs tablet Take 1 tablet by mouth daily at 12 noon.        Discharge home in stable condition Infant Feeding: Breast Infant Disposition:home with mother Discharge instruction: per After Visit Summary and Postpartum booklet. Activity: Advance as tolerated. Pelvic rest for 6 weeks.  Diet: routine diet Future Appointments: Future Appointments  Date Time Provider [place]  08/14/2020  2:00 PM [facility] NURSE [facility]  09/17/2020  1:35 PM Starr Lake, CNM [facility]   Follow up Visit: [facility] Dr[facility] Astrid Drafts.   Please schedule this patient for a In person postpartum visit in 6 weeks with the following provider: Any provider. Additional Postpartum F/U:BP check 1 week  High risk pregnancy complicated by: gHTN, Trich (neg TOC) Delivery mode:  Vaginal, Spontaneous  Anticipated Birth Control:  Depo  #gHTN: Norvasc 10 mg  08/08/2020 Brooks Sailors, MD   GME ATTESTATION:  I saw and evaluated the patient. I agree with the findings and the plan of care as documented in the resident's note.  Arrie Senate, MD OB Fellow, [place] for [place] 08/08/2020 8:27 AM

## 2020-08-06 NOTE — Progress Notes (Signed)
Labor Progress Note Corby Villasenor is a 25 y.o. G2P0010 at [redacted]w[redacted]d presented to MAU with concern of contractions and found to have elevated blood pressure and prolonged deceleration on fetal monitoring. S: Patient feeling well without complaints currently. States her pain is well controlled.   O:  BP 121/64   Pulse 65   Temp 97.8 F (36.6 C) (Oral)   Resp 16   Ht 5\' 5"  (1.651 m)   Wt 91.4 kg   LMP 11/05/2019   SpO2 98%   BMI 33.51 kg/m   CVE: Dilation: 10 Effacement (%): 90 Cervical Position: Posterior Station: Plus 1 Presentation: Vertex Exam by:: Dr [email or web address]  A&P: 25 y.o. G2P0010 [redacted]w[redacted]d  #Labor: Progressing well. AROM at 1752. #Pain: Epidural #FWB: Cat I strip #GBS positive #MOF: Breast #MOC: Depo PP #Circ: Yes #gHTN: multiple mild range blood pressures on admission and several documented in clinic during third trimester. BP currently well controlled  [redacted]w[redacted]d, MD 5:52 PM

## 2020-08-06 NOTE — Anesthesia Preprocedure Evaluation (Signed)
Anesthesia Evaluation  Patient identified by MRN, date of birth, ID band Patient awake    Reviewed: Allergy & Precautions, Patient's Chart, lab work & pertinent test results  Airway Mallampati: II       Dental   Pulmonary neg pulmonary ROS,    Pulmonary exam normal        Cardiovascular negative cardio ROS Normal cardiovascular exam     Neuro/Psych negative neurological ROS     GI/Hepatic negative GI ROS, Neg liver ROS,   Endo/Other  negative endocrine ROS  Renal/GU negative Renal ROS     Musculoskeletal   Abdominal   Peds  Hematology negative hematology ROS (+)   Anesthesia Other Findings   Reproductive/Obstetrics (+) Pregnancy                             Anesthesia Physical Anesthesia Plan  ASA: II  Anesthesia Plan: Epidural   Post-op Pain Management:    Induction:   PONV Risk Score and Plan: Treatment may vary due to age or medical condition  Airway Management Planned: Natural Airway  Additional Equipment:   Intra-op Plan:   Post-operative Plan:   Informed Consent: I have reviewed the patients History and Physical, chart, labs and discussed the procedure including the risks, benefits and alternatives for the proposed anesthesia with the patient or authorized representative who has indicated his/her understanding and acceptance.       Plan Discussed with:   Anesthesia Plan Comments:         Anesthesia Quick Evaluation  

## 2020-08-06 NOTE — Anesthesia Procedure Notes (Signed)
Epidural Patient location during procedure: OB Start time: 08/06/2020 2:07 PM End time: 08/06/2020 2:13 PM  Staffing Anesthesiologist: Marcene Duos, MD Performed: anesthesiologist   Preanesthetic Checklist Completed: patient identified, IV checked, site marked, risks and benefits discussed, surgical consent, monitors and equipment checked, pre-op evaluation and timeout performed  Epidural Patient position: sitting Prep: DuraPrep and site prepped and draped Patient monitoring: continuous pulse ox and blood pressure Approach: midline Location: L4-L5 Injection technique: LOR air  Needle:  Needle type: Tuohy  Needle gauge: 17 G Needle length: 9 cm and 9 Needle insertion depth: 7 cm Catheter type: closed end flexible Catheter size: 19 Gauge Catheter at skin depth: 12 cm Test dose: negative  Assessment Events: blood not aspirated, injection not painful, no injection resistance, no paresthesia and negative IV test

## 2020-08-06 NOTE — H&P (Addendum)
OBSTETRIC ADMISSION HISTORY AND PHYSICAL  Alexandra Freeman is a 25 y.o. female G2P0010 with IUP at 61w2dby L/6 who presented to MAU with concern of contractions and found to have elevated blood pressure and prolonged deceleration on fetal monitoring. She reports +FMs, no LOF, no VB, no blurry vision, headaches and RUQ pain. She report lower extremity edema. She plans on breastfeeding. She requests Depo PP for birth control. [facility]   Dating: By L/6 ---> Estimated Date of Delivery: 08/11/20  Sono: @[redacted]w[redacted]d , CWD, normal anatomy, cephalic presentation, posterior lie, 649g, 18% EFW  Prenatal History/Complications:  - Elevated BP on arrival to MAU - Irritable bowel syndrome with diarrhea - GBS bacteriuria  Past Medical History: Past Medical History:  Diagnosis Date  . Medical history non-contributory   . Menorrhagia     Past Surgical History: Past Surgical History:  Procedure Laterality Date  . NO PAST SURGERIES      Obstetrical History: OB History    Gravida  2   Para  0   Term  0   Preterm  0   AB  1   Living  0     SAB  0   IAB  1   Ectopic  0   Multiple  0   Live Births  0           Social History Social History   Socioeconomic History  . Marital status: Single    Spouse name: Not on file  . Number of children: Not on file  . Years of education: Not on file  . Highest education level: Not on file  Occupational History  . Not on file  Tobacco Use  . Smoking status: Never Smoker  . Smokeless tobacco: Never Used  Vaping Use  . Vaping Use: Never used  Substance and Sexual Activity  . Alcohol use: No  . Drug use: No  . Sexual activity: Yes    Birth control/protection: None  Other Topics Concern  . Not on file  Social History Narrative  . Not on file   Social Determinants of Health   Financial Resource Strain: Not on file  Food Insecurity: No Food Insecurity  . Worried About RCharity fundraiserin the Last  Year: Never true  . Ran Out of Food in the Last Year: Never true  Transportation Needs: No Transportation Needs  . Lack of Transportation (Medical): No  . Lack of Transportation (Non-Medical): No  Physical Activity: Not on file  Stress: Not on file  Social Connections: Not on file    Family History: Family History  Problem Relation Age of Onset  . Polymyositis Mother     Allergies: No Known Allergies  Medications Prior to Admission  Medication Sig Dispense Refill Last Dose  . Blood Pressure Monitoring (BLOOD PRESSURE KIT) DEVI 1 Device by Does not apply route as needed. 1 each 0   . Elastic Bandages & Supports (COMFORT FIT MATERNITY SUPP SM) MISC 1 Units by Does not apply route daily as needed. (Patient not taking: Reported on 08/01/2020) 1 each 0   . Prenatal Vit-Fe Fumarate-FA (MULTIVITAMIN-PRENATAL) 27-0.8 MG TABS tablet Take 1 tablet by mouth daily at 12 noon.        Review of Systems   All systems reviewed and negative except as stated in HPI  Blood pressure (!) 144/88, pulse 70, temperature 97.8 F (36.6 C), temperature source Oral, resp. rate 16, height 5' 5"  (1.651 m), weight 91.4  kg, last menstrual period 11/05/2019, SpO2 99 %. General appearance: alert, no acute distress Lungs: increased WOB with contractions Heart: regular rate  Abdomen: soft, non-tender Extremities: warm and well perfused Presentation: cephalic Fetal monitoring Baseline: 125 bpm, Variability: Good {> 6 bpm), Accelerations: Present and Decelerations: 1 prolonged deceleration in MAU shortly after arrival Uterine activity Frequency: Every 7-8 minutes Dilation: 1.5 Effacement (%): 70 Station: -3 Exam by:: dr Nehemiah Settle  Prenatal labs: ABO, Rh: --/--/A POS (03/07 0920) Antibody: NEG (03/07 0920) Rubella: 1.95 (08/24 1441) RPR: Non Reactive (12/13 0847)  HBsAg: Negative (08/24 1441)  HIV: Non Reactive (12/13 0847)  GBS: Positive GBS (Urine culture 08/24)  GTT 76/76/44 Genetic screening  NIPS:  low risk female; AFP: negative Anatomy US wnl  Prenatal Transfer Tool  Maternal Diabetes: No Genetic Screening: Normal Maternal Ultrasounds/Referrals: Normal Fetal Ultrasounds or other Referrals:  None Maternal Substance Abuse:  No Significant Maternal Medications:  None Significant Maternal Lab Results: Group B Strep positive  Results for orders placed or performed during the hospital encounter of 08/06/20 (from the past 24 hour(s))  CBC   Collection Time: 08/06/20  9:20 AM  Result Value Ref Range   WBC 8.6 4.0 - 10.5 K/uL   RBC 3.60 (L) 3.87 - 5.11 MIL/uL   Hemoglobin 11.5 (L) 12.0 - 15.0 g/dL   HCT 33.6 (L) 36.0 - 46.0 %   MCV 93.3 80.0 - 100.0 fL   MCH 31.9 26.0 - 34.0 pg   MCHC 34.2 30.0 - 36.0 g/dL   RDW 14.4 11.5 - 15.5 %   Platelets 189 150 - 400 K/uL   nRBC 0.0 0.0 - 0.2 %  Comprehensive metabolic panel   Collection Time: 08/06/20  9:20 AM  Result Value Ref Range   Sodium 133 (L) 135 - 145 mmol/L   Potassium 3.7 3.5 - 5.1 mmol/L   Chloride 105 98 - 111 mmol/L   CO2 17 (L) 22 - 32 mmol/L   Glucose, Bld 81 70 - 99 mg/dL   BUN 9 6 - 20 mg/dL   Creatinine, Ser 0.75 0.44 - 1.00 mg/dL   Calcium 8.8 (L) 8.9 - 10.3 mg/dL   Total Protein 6.7 6.5 - 8.1 g/dL   Albumin 3.0 (L) 3.5 - 5.0 g/dL   AST 27 15 - 41 U/L   ALT 14 0 - 44 U/L   Alkaline Phosphatase 172 (H) 38 - 126 U/L   Total Bilirubin 0.4 0.3 - 1.2 mg/dL   GFR, Estimated >60 >60 mL/min   Anion gap 11 5 - 15  Type and screen [place]   Collection Time: 08/06/20  9:20 AM  Result Value Ref Range   ABO/RH(D) A POS    Antibody Screen NEG    Sample Expiration      08/09/2020,2359 Performed at [facility], [address] [place], [place] [address]   POCT fern test   Collection Time: 08/06/20  9:45 AM  Result Value Ref Range   POCT Fern Test Negative = intact amniotic membranes     Patient Active Problem List   Diagnosis Date Noted  . Group B streptococcal bacteriuria 07/31/2020  .  Irritable bowel syndrome with diarrhea 06/14/2020  . Supervision of low-risk first pregnancy, first trimester 01/24/2020    Assessment/Plan:  Alexandra Freeman is a 25 y.o. G2P0010 at [redacted]w[redacted]d admitted for augmentation of latent labor secondary to new diagnosis of gHTN and Category 2 strip in MAU on arrival.  #Labor: Given latent labor with  Category 2 strip in MAU will defer cytotec at this time. FB placed with plan to augment with pitocin as clinically indicated. #Pain: TBD per pt request #FWB: Category 2 strip in MAU on arrival; reassuringly now Category 1 #ID: GBS pos; PCN initiated on arrival #MOF: Breast #MOC: Depo PP #Circ: Yes #gHTN: multiple mild range blood pressures on admission and several documented in clinic during third trimester. Asymptomatic. Will f/u PreE labs.  Rennis Chris, MS3  Attestation of Supervision of Student:  I confirm that I have verified the information documented in the medical student's note and that I have also personally reperformed the history, physical exam and all medical decision making activities.  I have verified that all services and findings are accurately documented in this student's note; and I agree with management and plan as outlined in the documentation. I have also made any necessary editorial changes.  Randa Ngo, [place] for [facility], [place] [facility] 08/06/2020 11:14 AM  Astrid Drafts, Gildardo Cranker, MD OB Fellow, Faculty Practice 08/06/2020 11:13 AM

## 2020-08-06 NOTE — Discharge Instructions (Signed)

## 2020-08-06 NOTE — MAU Provider Note (Signed)
  History     CSN: [id number]  Arrival date and time: 08/06/20 0758     Chief Complaint  Patient presents with  . Contractions   HPI This is a 25 yo G2P0010 at 24 w 2 d. Started with contractions earlier today. While here, had mild range BP and fetal heart rate deceleration.   OB History    Gravida  2   Para  0   Term  0   Preterm  0   AB  1   Living  0     SAB  0   IAB  1   Ectopic  0   Multiple  0   Live Births  0           Past Medical History:  Diagnosis Date  . Medical history non-contributory   . Menorrhagia     Past Surgical History:  Procedure Laterality Date  . NO PAST SURGERIES      Family History  Problem Relation Age of Onset  . Polymyositis Mother     Social History   Tobacco Use  . Smoking status: Never Smoker  . Smokeless tobacco: Never Used  Vaping Use  . Vaping Use: Never used  Substance Use Topics  . Alcohol use: No  . Drug use: No    Allergies: No Known Allergies  Medications Prior to Admission  Medication Sig Dispense Refill Last Dose  . Blood Pressure Monitoring (BLOOD PRESSURE KIT) DEVI 1 Device by Does not apply route as needed. 1 each 0   . Elastic Bandages & Supports (COMFORT FIT MATERNITY SUPP SM) MISC 1 Units by Does not apply route daily as needed. (Patient not taking: Reported on 08/01/2020) 1 each 0   . Prenatal Vit-Fe Fumarate-FA (MULTIVITAMIN-PRENATAL) 27-0.8 MG TABS tablet Take 1 tablet by mouth daily at 12 noon.       Review of Systems Physical Exam   Blood pressure (!) 146/83, pulse 84, temperature 97.8 F (36.6 C), temperature source Oral, resp. rate 18, height _0  (1.651 m), weight 91.4 kg, last menstrual period 11/05/2019, SpO2 100 %.  Physical Exam Vitals reviewed. Exam conducted with a chaperone present.  Constitutional:      Appearance: Normal appearance.  Cardiovascular:     Rate and Rhythm: Normal rate and regular rhythm.     Pulses: Normal pulses.  Neurological:     General: No  focal deficit present.     Mental Status: She is alert.  Psychiatric:        Mood and Affect: Mood normal.        Behavior: Behavior normal.        Thought Content: Thought content normal.        Judgment: Judgment normal.    Dilation: 1.5 Effacement (%): 70 Cervical Position: Posterior Station: -3 Presentation: Vertex Exam by:: dr Nehemiah Settle   MAU Course  Procedures FHT: 120s, mod variability, + accel. 2 min deceleration.  MDM   Assessment and Plan  Admit for augmentation Planning on breast feeding Desires circumcision for baby.  Plans on depo for contraception.  Truett Mainland 08/06/2020, 9:07 AM

## 2020-08-07 LAB — RPR: RPR Ser Ql: NONREACTIVE

## 2020-08-07 MED ORDER — AMLODIPINE BESYLATE 5 MG PO TABS
5.0000 mg | ORAL_TABLET | Freq: Every day | ORAL | Status: DC
  Administered 2020-08-07: 5 mg via ORAL
  Filled 2020-08-07: qty 1

## 2020-08-07 MED ORDER — MEDROXYPROGESTERONE ACETATE 150 MG/ML IM SUSP
150.0000 mg | Freq: Once | INTRAMUSCULAR | Status: AC
  Administered 2020-08-08: 150 mg via INTRAMUSCULAR
  Filled 2020-08-07: qty 1

## 2020-08-07 NOTE — Progress Notes (Addendum)
POSTPARTUM PROGRESS NOTE  Subjective: Alexandra Freeman is a 25 y.o. G2P0010 s/p SVD at [redacted]w[redacted]d.  She reports she doing well. No acute events overnight. She denies any problems with ambulating, voiding or po intake. Denies nausea or vomiting. She has  passed flatus. Pain is well controlled.  Lochia is mild.  Objective: Blood pressure 130/69, pulse 92, temperature 98.4 F (36.9 C), temperature source Oral, resp. rate 20, height 5\' 5"  (1.651 m), weight 91.4 kg, last menstrual period 11/05/2019, SpO2 99 %.  Physical Exam:  General: alert, cooperative and no distress Chest: no respiratory distress Abdomen: soft, non-tender  Uterine Fundus: firm and at level of umbilicus Extremities: No calf swelling or tenderness  mild BLE edema  Recent Labs    08/06/20 0920 08/06/20 2142  HGB 11.5* 11.2*  HCT 33.6* 32.7*    Assessment/Plan: Alexandra Freeman is a 25 y.o. G2P0010 s/p SVD at [redacted]w[redacted]d.  Routine Postpartum Care: Doing well, pain well-controlled.  -- Continue routine care, lactation support  -- Contraception: Depo -- Feeding: Breast, going well  #gHTN: asymptomatic. Start norvasc 5mg .  Dispo: Plan for discharge tomorrow.  Muus, [redacted]w[redacted]d, MD OB Fellow, Faculty Practice 08/07/2020 7:07 AM  GME ATTESTATION:  I saw and evaluated the patient. I agree with the findings and the plan of care as documented in the resident's note.  Raphael Gibney, MD OB Fellow, Faculty [facility], Center for [facility] 08/07/2020 8:01 AM

## 2020-08-07 NOTE — Anesthesia Postprocedure Evaluation (Signed)
Anesthesia Post Note  Patient: Alexandra Freeman  Procedure(s) Performed: AN AD HOC LABOR EPIDURAL     Patient location during evaluation: Mother Baby Anesthesia Type: Epidural Level of consciousness: awake and alert Pain management: pain level controlled Vital Signs Assessment: post-procedure vital signs reviewed and stable Respiratory status: spontaneous breathing, nonlabored ventilation and respiratory function stable Cardiovascular status: stable Postop Assessment: no headache, no backache, epidural receding, no apparent nausea or vomiting, patient able to bend at knees, adequate PO intake and able to ambulate Anesthetic complications: no   No complications documented.  Last Vitals:  Vitals:   08/06/20 2357 08/07/20 0400  BP: 128/88 130/69  Pulse: 81 92  Resp: 18 20  Temp: 36.7 C 36.9 C  SpO2: 98% 99%    Last Pain:  Vitals:   08/07/20 0400  TempSrc: Oral  PainSc: 0-No pain   Pain Goal: Patients Stated Pain Goal: 0 (08/06/20 0945)                 Laban Emperor

## 2020-08-08 ENCOUNTER — Other Ambulatory Visit: Payer: Self-pay | Admitting: Family Medicine

## 2020-08-08 MED ORDER — IBUPROFEN 600 MG PO TABS: 600.0000 mg | ORAL_TABLET | Freq: Four times a day (QID) | ORAL | 0 refills | Status: DC | PRN

## 2020-08-08 MED ORDER — AMLODIPINE BESYLATE 10 MG PO TABS: 10.0000 mg | ORAL_TABLET | Freq: Every day | ORAL | 3 refills | Status: DC

## 2020-08-08 MED ORDER — COCONUT OIL OIL: 1.0000 "application " | TOPICAL_OIL | 0 refills | Status: DC | PRN

## 2020-08-08 MED ORDER — ACETAMINOPHEN 500 MG PO TABS: 1000.0000 mg | ORAL_TABLET | Freq: Four times a day (QID) | ORAL | Status: DC

## 2020-08-08 MED ORDER — AMLODIPINE BESYLATE 5 MG PO TABS
10.0000 mg | ORAL_TABLET | Freq: Every day | ORAL | Status: DC
  Administered 2020-08-08: 10 mg via ORAL
  Filled 2020-08-08: qty 2

## 2020-08-08 MED FILL — AMLODIPINE BESYLATE 10 MG T: 10 | 30 days supply | Qty: 30 | Fill #0

## 2020-08-08 NOTE — Lactation Note (Signed)
This note was copied from a baby's chart. Lactation Consultation Note  Patient Name: Boy Clyde Upshaw [id number] Date: 08/08/2020 Reason for consult: Follow-up assessment;Primapara;1st time breastfeeding;Term;Infant weight loss;Other (Comment) (7 % weight loss) Age:26 hours  As LC entered the room, mom had been feeding the baby a bottle, baby still rooting, LC offered to assist to latch. Mom mentioned she was unable to express milk , but had many breast changes with trimester. LC reassured mom the more consistent she is hand expressing, increase let down.  LC reviewed hand expressing. After several attempts baby latched for 8 mins, no swallows. After the latch, LC observed the baby feeding with yellow nipple and due to leakage from the bottle, switched to the purple - slow flow and baby tolerated better, and no leakage. LC noted baby to have a recessed chin and showed mom how to ease the chin down with the latch and when bottle feeding.  Mom expressed she really wants to breast feed. LC reviewed ways to transition to to the breast. Mom has a DEBP at home and [facility] recommended working on latching, supplementing for now, and post pump to enhance the milk coming in.  See below for more details on the tools for [facility] plan.  LC provided the [facility] brochure for resources for F/U .   Maternal Data Has patient been taught Hand Expression?: Yes  Feeding Mother's Current Feeding Choice: Breast Milk and Formula  LATCH Score Latch: Repeated attempts needed to sustain latch, nipple held in mouth throughout feeding, stimulation needed to elicit sucking reflex.  Audible Swallowing: None  Type of Nipple: Everted at rest and after stimulation  Comfort (Breast/Nipple): Soft / non-tender  Hold (Positioning): Assistance needed to correctly position infant at breast and maintain latch.  LATCH Score: 6   Lactation Tools Discussed/Used Tools: Shells;Pump;Flanges Flange Size: 24;27 Breast pump type: Manual Pump  Education: Setup, frequency, and cleaning Reason for Pumping: LC recommended due to colostrum slow to flow - after breast massage, hand express, pre- pump  Interventions Interventions: Breast feeding basics reviewed;Assisted with latch;Skin to skin;Breast massage;Hand express;Breast compression;Adjust position;Support pillows;Position options;Shells;Hand pump  Discharge Discharge Education: Engorgement and breast care;Warning signs for feeding baby Pump: Personal  Consult Status Consult Status: Complete Date: 08/08/20    Kathrin Greathouse 08/08/2020, 9:40 AM

## 2020-08-08 NOTE — Progress Notes (Deleted)
POSTPARTUM PROGRESS NOTE  Subjective: Alexandra Freeman is a 25 y.o. G2P0010 s/p SVD at [redacted]w[redacted]d.  She reports she doing well. No acute events overnight. She denies any problems with ambulating, voiding or po intake. Denies nausea or vomiting. She has passed flatus. Pain is well controlled.  Lochia is mild.  Objective: Blood pressure 136/88, pulse 77, temperature 98.2 F (36.8 C), temperature source Oral, resp. rate 18, height 5\' 5"  (1.651 m), weight 91.4 kg, last menstrual period 11/05/2019, SpO2 100 %.  Physical Exam:  General: alert, cooperative and no distress Chest: no respiratory distress Abdomen: soft, non-tender  Uterine Fundus: firm and at level of umbilicus Extremities: No calf swelling or tenderness  no edema  Recent Labs    08/06/20 0920 08/06/20 2142  HGB 11.5* 11.2*  HCT 33.6* 32.7*   Assessment/Plan: Alexandra Freeman is a 25 y.o. G2P1011 s/p SVD at [redacted]w[redacted]d.  Routine Postpartum Care: Doing well, pain well-controlled.  -- Continue routine care, lactation support  -- Contraception: Depo, not given yet -- Feeding: Breast, going well -- gHTN: Discharge on norvasc 5 mg for 45 days until post partum appointment  Dispo: Plan for discharge today after depo/circumcision.  Muus, [redacted]w[redacted]d, MD OB Fellow, Faculty Practice 08/08/2020 7:48 AM

## 2020-08-09 ENCOUNTER — Encounter: Payer: Medicaid Other | Admitting: Obstetrics and Gynecology

## 2020-08-14 ENCOUNTER — Other Ambulatory Visit: Payer: Self-pay

## 2020-08-14 ENCOUNTER — Ambulatory Visit (INDEPENDENT_AMBULATORY_CARE_PROVIDER_SITE_OTHER): Payer: Medicaid Other

## 2020-08-14 VITALS — BP 132/84 | HR 90 | Wt 183.1 lb

## 2020-08-14 DIAGNOSIS — Z0130 Encounter for examination of blood pressure without abnormal findings: Secondary | ICD-10-CM

## 2020-08-14 NOTE — Progress Notes (Signed)
Patient here for blood pressure check following spontaneous vaginal on 08/06/20. Pt had new diagnosis of gestational hypertension at time of delivery. BP today is 134/84. Patient denies any s/s of elevated BP. Reviewed with Gerrit Heck, CNM who recommends pt continue Norvasc 10 mg daily. Pt to return for virtual BP check in 1 week and follow-up at [facility] visit on 09/17/20. Verified pt has BP cuff at home.  Pt reports some difficulty with milk production and latching baby; pt agreeable to appt with lactation consultant.  Fleet Contras RN 08/14/20

## 2020-08-16 NOTE — Progress Notes (Signed)
Patient was evaluated by nursing staff during this encounter. After review of results, patient is to continue norvasc as ordered and follow up in one week, while monitoring bp at home if symptoms arise.    Cherre Robins, CNM 08/16/2020 8:32 PM

## 2020-08-21 ENCOUNTER — Telehealth (INDEPENDENT_AMBULATORY_CARE_PROVIDER_SITE_OTHER): Payer: Medicaid Other | Admitting: Lactation Services

## 2020-08-21 ENCOUNTER — Encounter: Payer: Self-pay | Admitting: Lactation Services

## 2020-08-21 VITALS — BP 123/65

## 2020-08-21 DIAGNOSIS — Z0130 Encounter for examination of blood pressure without abnormal findings: Secondary | ICD-10-CM

## 2020-08-21 NOTE — Progress Notes (Signed)
Chart reviewed for nurse visit. Agree with plan of care.   Venora Maples, MD 08/21/20 12:10 PM

## 2020-08-21 NOTE — Progress Notes (Signed)
Called patient at 10:03 to ask her to sign on to Video visit. She was not able to connect virtually so visit was completed over the phone.   Patient reports she is feeling great. She reports her BP has been high at times 132/84 yesterday. She reports yesterday she was out and about more and took her BP medication late and was having some dizziness. She reports the headache went away quickly after BP med, nap, and drinking water.   Patients BF 123/65 LA and sitting.   Patient to follow up for Post Partum Appointment on 4/18 at 1:35.   Reviewed with patient to check BP weekly and report any readings over 140/90 and if especially if accompanied by a headache, Blurred Vision, or dizziness.   Mom is concerned with pumping milk. She is using a hands free pump. She was getting 2-3 ounces per pumping previously and now is down to 1 ounce per pumping. Infant is not latching well, infant gets frustrated. Mom is pumping 2-3 times day. She had an appointment with Lactation that she did not come to. Reviewed supply and demand and importance of emptying the breast at least 8 times a day to promote milk supply. Offered mom outpatient Lactation Appointment mom agreeable, message to front desk to call ans schedule patient for follow up.

## 2020-09-12 ENCOUNTER — Telehealth: Payer: Self-pay

## 2020-09-12 NOTE — Telephone Encounter (Signed)
Pt called requesting information on how to stop the bleeding and that she has an appt scheduled on the 8th.    Addison Naegeli, RN  09/12/20

## 2020-09-13 NOTE — Telephone Encounter (Signed)
Called patient and she reports she is still bleeding after delivery and has been concerned about it. She reports recently stopping breastfeeding last week and was given the depo shot prior to hospital discharge but she doesn't think she wants to do that anymore. Patient reports considering pills. She states bleeding was heavy after delivery but has slowed down since then and is light/spotting. Reviewed with patient normal bleeding following delivery and that depo can have an impact on that as well. Provided reassurance and discussed that Samara Deist can do an exam on Monday as well if she would like. Patient verbalized understanding & had no additional questions.

## 2020-09-17 ENCOUNTER — Encounter: Payer: Self-pay | Admitting: Student

## 2020-09-17 ENCOUNTER — Ambulatory Visit (INDEPENDENT_AMBULATORY_CARE_PROVIDER_SITE_OTHER): Payer: BC Managed Care – PPO | Admitting: Student

## 2020-09-17 MED ORDER — MEDROXYPROGESTERONE ACETATE 10 MG PO TABS: 10.0000 mg | ORAL_TABLET | Freq: Every day | ORAL | 0 refills | Status: DC

## 2020-09-17 NOTE — Patient Instructions (Signed)
-  reduce to 5 mg of amlodipine for 5 days, then 0 mg and take BP in two days -start provera (10 mg) for 10 days.

## 2020-09-17 NOTE — Progress Notes (Signed)
    Post Partum Visit Note  Alexandra Freeman is a 25 y.o. G64P1011 female who presents for a postpartum visit. She is 6 weeks postpartum following a normal spontaneous vaginal delivery.  I have fully reviewed the prenatal and intrapartum course. The delivery was at [redacted]w[redacted]d gestational weeks.  Anesthesia: epidural. Postpartum course has been uneventful. Baby is doing well. Baby is feeding by bottle - [employer]. Bleeding thin lochia. Bowel function is normal. Bladder function is normal. Patient is not sexually active. Contraception method is none. Postpartum depression screening: negative. Bleeding was heavy at first, is now wearing thin pad. Bleeding had clots at first, and now it has resolved. She is frustrated that she is still bleeding.   The pregnancy intention screening data noted above was reviewed. Potential methods of contraception were discussed. The patient elected to proceed with No Method - Other Reason.     Health Maintenance Due  Topic Date Due  . HPV VACCINES (1 - 2-dose series) Never done    The following portions of the patient's history were reviewed and updated as appropriate: allergies, current medications, past family history, past medical history, past social history, past surgical history and problem list.  Review of Systems Pertinent items are noted in HPI.  Objective:  LMP 11/05/2019    General:  alert and cooperative   Breasts:  not indicated  Lungs: clear to auscultation bilaterally  Heart:  regular rate and rhythm, S1, S2 normal, no murmur, click, rub or gallop  Abdomen: soft, non-tender; bowel sounds normal; no masses,  no organomegaly   Wound   GU exam:  normal       Assessment:    1. Postpartum care and examination     Healthy postpartum exam. Pap smear not done today.   Plan:   Essential components of care per ACOG recommendations:  1.  Mood and well being: Patient with negative depression screening today. Reviewed local resources  for support.  - Patient does not use tobacco.  - hx of drug use? No    2. Infant care and feeding:  -Patient currently breastmilk feeding? No  3. Sexuality, contraception and birth spacing - Patient does not want a pregnancy in the next year.  Desired family size is 2 children.  - Reviewed forms of contraception in tiered fashion. Patient desired no method today.   - Discussed birth spacing of 18 months -She does not want birth control today, but she does want something to stop the bleeding.  -Provera Rx given today, cautioend patient about withdrawal bleed  4. Sleep and fatigue -Encouraged family/partner/community support of 4 hrs of uninterrupted sleep to help with mood and fatigue  5. Physical Recovery  - Discussed patients delivery and complications - Patient had a Vaginal, no problems at delivery. Perineal healing reviewed. Patient expressed understanding - Patient has urinary incontinence? No  - Patient is safe to resume physical and sexual activity  6.  Health Maintenance - HM due items addressed No - none - Last pap smear done and was normal with negative HPV. Pap smear not done at today's visit.  Mammogram due  -Patient is still taking Norvasc 10. Advised  7. Chronic Disease  - PCP follow up  Vidal Schwalbe, CMA [facility] for [employer], [facility]

## 2020-10-08 ENCOUNTER — Encounter: Payer: Self-pay | Admitting: Family Medicine

## 2020-10-08 ENCOUNTER — Ambulatory Visit (INDEPENDENT_AMBULATORY_CARE_PROVIDER_SITE_OTHER): Payer: BC Managed Care – PPO | Admitting: Family Medicine

## 2020-10-08 ENCOUNTER — Other Ambulatory Visit: Payer: Self-pay

## 2020-10-08 VITALS — BP 136/71 | HR 92 | Ht 65.0 in | Wt 179.6 lb

## 2020-10-08 DIAGNOSIS — F53 Postpartum depression: Secondary | ICD-10-CM

## 2020-10-08 DIAGNOSIS — N939 Abnormal uterine and vaginal bleeding, unspecified: Secondary | ICD-10-CM | POA: Diagnosis not present

## 2020-10-08 DIAGNOSIS — Z30011 Encounter for initial prescription of contraceptive pills: Secondary | ICD-10-CM

## 2020-10-08 DIAGNOSIS — O99345 Other mental disorders complicating the puerperium: Secondary | ICD-10-CM | POA: Diagnosis not present

## 2020-10-08 MED ORDER — NORETHIN ACE-ETH ESTRAD-FE 1-20 MG-MCG(24) PO TABS: 1.0000 | ORAL_TABLET | Freq: Every day | ORAL | 3 refills | Status: DC

## 2020-10-08 MED ORDER — SERTRALINE HCL 50 MG PO TABS: 50.0000 mg | ORAL_TABLET | Freq: Every day | ORAL | 2 refills | Status: DC

## 2020-10-08 NOTE — Progress Notes (Signed)
   Subjective:    Patient ID: Alexandra Freeman is a 25 y.o. female presenting with Follow-up  on 10/08/2020  HPI: S/p depo given immediately after birth. Still nursing. She is bleeding non-stop. Also having depression. Went back to work and crying daily. This is feeling quite overwhelming to her.  Review of Systems  Constitutional: Negative for chills and fever.  Respiratory: Negative for shortness of breath.   Cardiovascular: Negative for chest pain.  Gastrointestinal: Negative for abdominal pain, nausea and vomiting.  Genitourinary: Positive for vaginal bleeding. Negative for dysuria.  Skin: Negative for rash.      Objective:    BP 136/71   Pulse 92   Ht 5\' 5"  (1.651 m)   Wt 179 lb 9.6 oz (81.5 kg)   LMP 11/05/2019   BMI 29.89 kg/m  Physical Exam Constitutional:      General: She is not in acute distress.    Appearance: She is well-developed.  HENT:     Head: Normocephalic and atraumatic.  Eyes:     General: No scleral icterus. Cardiovascular:     Rate and Rhythm: Normal rate.  Pulmonary:     Effort: Pulmonary effort is normal.  Abdominal:     Palpations: Abdomen is soft.  Musculoskeletal:     Cervical back: Neck supple.  Skin:    General: Skin is warm and dry.  Neurological:     Mental Status: She is alert and oriented to person, place, and time.         Assessment & Plan:   Problem List Items Addressed This Visit      Unprioritized   Postpartum depression    Begin Zoloft, usual side effects, onset of action, and treatment options reviewed.--refer to [facility] specialist      Relevant Medications   sertraline (ZOLOFT) 50 MG tablet   Other Relevant Orders   Ambulatory referral to [facility]    Other Visit Diagnoses    Abnormal bleeding in menstrual cycle    -  Primary   related to Depo--discussed IUD, will try low dose OC--watch BP   Relevant Medications   Norethindrone Acetate-Ethinyl Estrad-FE (LOESTRIN 24 FE) 1-20 MG-MCG(24)  tablet   Initiation of oral contraception       will change contraceptive to orals--   Relevant Medications   Norethindrone Acetate-Ethinyl Estrad-FE (LOESTRIN 24 FE) 1-20 MG-MCG(24) tablet       Return in about 4 weeks (around 11/05/2020) for virtual, a follow-up.  01/05/2021 10/08/2020 8:39 PM

## 2020-10-08 NOTE — Assessment & Plan Note (Addendum)
Begin Zoloft, usual side effects, onset of action, and treatment options reviewed.--refer to [facility] specialist

## 2020-10-12 NOTE — BH Specialist Note (Signed)
Pt did not arrive to video visit and did not answer the phone ; Left HIPPA-compliant message to call back Shameria Trimarco from [facility] at [phone] (main office) or [phone] (Satonya Lux's office).  ; left MyChart message for patient.      

## 2020-10-24 ENCOUNTER — Telehealth: Payer: Self-pay | Admitting: Family Medicine

## 2020-10-24 DIAGNOSIS — N939 Abnormal uterine and vaginal bleeding, unspecified: Secondary | ICD-10-CM

## 2020-10-24 NOTE — Telephone Encounter (Signed)
I called Marriana back and she reports the doctor told her to call back in 4-5 days if bleeding did not improve She states her bleeding is the same if not heavier. States she changes her pad at lease 3 times a day and the pads are at least 1/2 to full. States she wanted to see if dose needs to be changed or get a shot. I informed her I will forward message to provider and once we hear back with new plan of care- we will contact her. She voices understanding. Dayrin Stallone,RN

## 2020-10-24 NOTE — Telephone Encounter (Signed)
Received message from Dr. Shawnie Pons recommends increase Sprintec to 1 pill daily. Also request pelvic US, CBC, and TSH.  Sent message to registrar to schedule lab only for CBC and TSH. Called Scheduling and unalble to reach scheduler. Will leave in inbox for [place] to be scheduled, and patient to be notified of plan of care. Talyn Eddie,RN

## 2020-10-25 ENCOUNTER — Ambulatory Visit: Payer: BC Managed Care – PPO | Admitting: Clinical

## 2020-10-25 DIAGNOSIS — Z91199 Patient's noncompliance with other medical treatment and regimen due to unspecified reason: Secondary | ICD-10-CM

## 2020-10-25 DIAGNOSIS — Z5329 Procedure and treatment not carried out because of patient's decision for other reasons: Secondary | ICD-10-CM

## 2020-10-25 MED ORDER — NORGESTIMATE-ETH ESTRADIOL 0.25-35 MG-MCG PO TABS: 1.0000 | ORAL_TABLET | Freq: Every day | ORAL | 11 refills | Status: DC

## 2020-10-25 NOTE — Telephone Encounter (Signed)
Called patient to inform her of Alexandra Freeman on 6/1 at 2:30 and to inform her OCP's have been changed to Sprintec. Patient did not answer. LM for patient to call the office at her convenience. My Chart Message sent.

## 2020-10-25 NOTE — Telephone Encounter (Addendum)
Called and scheduled [place] for 10/31/20 2:30 at [facility]. Per Dr. Shawnie Pons cancel Lo estrin and change to Sprintec 1 pill daily. Eulah Walkup,RN

## 2020-10-31 ENCOUNTER — Ambulatory Visit (HOSPITAL_COMMUNITY): Admission: RE | Admit: 2020-10-31 | Payer: BC Managed Care – PPO | Source: Ambulatory Visit

## 2020-11-06 ENCOUNTER — Ambulatory Visit (HOSPITAL_COMMUNITY): Payer: BC Managed Care – PPO

## 2020-11-07 ENCOUNTER — Telehealth: Payer: BC Managed Care – PPO | Admitting: Family Medicine

## 2020-11-08 ENCOUNTER — Other Ambulatory Visit: Payer: Self-pay

## 2020-11-08 ENCOUNTER — Other Ambulatory Visit: Payer: BC Managed Care – PPO

## 2020-11-08 DIAGNOSIS — N939 Abnormal uterine and vaginal bleeding, unspecified: Secondary | ICD-10-CM

## 2020-11-09 LAB — CBC
Hematocrit: 39.4 % (ref 34.0–46.6)
Hemoglobin: 12.8 g/dL (ref 11.1–15.9)
MCH: 29 pg (ref 26.6–33.0)
MCHC: 32.5 g/dL (ref 31.5–35.7)
MCV: 89 fL (ref 79–97)
Platelets: 248 10*3/uL (ref 150–450)
RBC: 4.42 x10E6/uL (ref 3.77–5.28)
RDW: 13.3 % (ref 11.7–15.4)
WBC: 6.5 10*3/uL (ref 3.4–10.8)

## 2020-11-09 LAB — TSH: TSH: 0.479 u[IU]/mL (ref 0.450–4.500)

## 2020-11-12 ENCOUNTER — Ambulatory Visit (HOSPITAL_COMMUNITY)
Admission: RE | Admit: 2020-11-12 | Discharge: 2020-11-12 | Disposition: A | Discharge status: Home / Self Care | Payer: BC Managed Care – PPO | Source: Ambulatory Visit | Attending: Family Medicine | Admitting: Family Medicine

## 2020-11-12 ENCOUNTER — Other Ambulatory Visit: Payer: Self-pay

## 2020-11-12 DIAGNOSIS — N939 Abnormal uterine and vaginal bleeding, unspecified: Secondary | ICD-10-CM | POA: Insufficient documentation

## 2020-11-12 DIAGNOSIS — N938 Other specified abnormal uterine and vaginal bleeding: Secondary | ICD-10-CM | POA: Diagnosis not present

## 2020-11-14 ENCOUNTER — Telehealth: Payer: BC Managed Care – PPO | Admitting: Family Medicine

## 2020-11-14 NOTE — Progress Notes (Signed)
Patient cancelled appointment today. She will be called to reschedule.

## 2020-11-29 ENCOUNTER — Telehealth (INDEPENDENT_AMBULATORY_CARE_PROVIDER_SITE_OTHER): Payer: BC Managed Care – PPO | Admitting: Obstetrics and Gynecology

## 2020-11-29 ENCOUNTER — Encounter: Payer: Self-pay | Admitting: Obstetrics and Gynecology

## 2020-11-29 VITALS — BP 115/67

## 2020-11-29 DIAGNOSIS — N939 Abnormal uterine and vaginal bleeding, unspecified: Secondary | ICD-10-CM | POA: Diagnosis not present

## 2020-11-29 DIAGNOSIS — F53 Postpartum depression: Secondary | ICD-10-CM | POA: Diagnosis not present

## 2020-11-29 DIAGNOSIS — O139 Gestational [pregnancy-induced] hypertension without significant proteinuria, unspecified trimester: Secondary | ICD-10-CM

## 2020-11-29 DIAGNOSIS — Z8759 Personal history of other complications of pregnancy, childbirth and the puerperium: Secondary | ICD-10-CM

## 2020-11-29 NOTE — Progress Notes (Signed)
POSTPARTUM VIRTUAL VISIT NOTE  I connected with Alexandra Freeman on 12/01/20 at  2:35 PM EDT by a video enabled telemedicine application and verified that I am speaking with the correct person using two identifiers.  Location: Patient: patient's home Provider: [facility]   I discussed the limitations of evaluation and management by telemedicine and the availability of in person appointments. The patient expressed understanding and agreed to proceed.  History:  25 y.o. G2P1011 here today for follow-up of gHTN, postpartum depression and irregular vaginal bleeding. Overall, pt reports that her symptoms have greatly improved. In regards to her bleeding, her periods seem to have normalized after transitioning from depo to the OCPs. She reports taking OCPs daily with good adherence. In regards to mood, she is no longer taking zoloft but reports a significant improvement after starting a new job and taking some time for herself to adjust to life after pregnancy. Her home blood pressure was normal today; she was instructed to stop her amlodipine at her last appointment with Dr. Shawnie Pons. She denies any abnormal vaginal discharge, bleeding, pelvic pain or other concerns.   Past Medical History:  Diagnosis Date   Medical history non-contributory    Menorrhagia     Past Surgical History:  Procedure Laterality Date   NO PAST SURGERIES       Current Outpatient Medications:    norgestimate-ethinyl estradiol (ORTHO-CYCLEN) 0.25-35 MG-MCG tablet, Take 1 tablet by mouth daily., Disp: 28 tablet, Rfl: 11   Prenatal Vit-Fe Fumarate-FA (PRENATAL MULTIVITAMIN) TABS tablet, Take 1 tablet by mouth daily at 12 noon. (Patient not taking: Reported on 11/29/2020), Disp: , Rfl:   The following portions of the patient's history were reviewed and updated as appropriate: allergies, current medications, past family history, past medical history, past social history, past surgical history and problem  list.   Health Maintenance:  Last pap: negative on 01/24/20 Last mammogram: n/a  Review of Systems:  Pertinent items noted in HPI and remainder of comprehensive ROS otherwise negative.   Objective:  Physical Exam BP 115/67   LMP 11/25/2020   Breastfeeding No   Limited exam given virtual visit today.  CONSTITUTIONAL: Well-developed, well-nourished female in no acute distress.  HENT:  Normocephalic, atraumatic. External right and left ear normal. Moist mucous membranes. EYES: Conjunctivae and EOM are normal. No scleral icterus.  NECK: Normal range of motion, supple, no masses NEUROLOGIC: Alert and oriented to person, place, and time. No cranial nerve deficit noted. PSYCHIATRIC: Normal mood and affect. Normal behavior. Normal judgment and thought content. RESPIRATORY: Normal effort  Labs and Imaging US Pelvis Complete  Result Date: 11/13/2020 CLINICAL DATA:  Postpartum bleeding, abnormal vaginal bleeding postpartum on 08/06/2020 with bleeding until 11/05/2020 EXAM: TRANSABDOMINAL ULTRASOUND OF PELVIS TECHNIQUE: Transabdominal ultrasound examination of the pelvis was performed including evaluation of the uterus, ovaries, adnexal regions, and pelvic cul-de-sac. Transvaginal imaging was not performed. COMPARISON:  None FINDINGS: Uterus Measurements: 8.5 x 4.3 x 6.2 cm = volume: 119 mL. Retroflexed. Normal morphology without mass Endometrium Thickness: 9 mm. No endometrial fluid or focal abnormality. No abnormal echogenicity within the endometrial complex. Right ovary Measurements: 3.0 x 2.3 x 1.6 cm = volume: 6 mL. Normal morphology without mass Left ovary Measurements: 3.0 x 3.1 x 2.5 cm = volume: 12.1 mL. Normal morphology without mass Other findings:  No free pelvic fluid.  No adnexal masses. IMPRESSION: No pelvic sonographic abnormalities identified. Electronically Signed   By: Ulyses Southward M.D.   On: 11/13/2020 07:47  Assessment & Plan:  25 y.o. G2P1011 here today for follow-up of gHTN,  postpartum depression and irregular vaginal bleeding.  1. Postpartum depression: Pt previously prescribed zoloft but no longer taking. Was referred to [employer] but canceled appointment given reported improvement in symptoms. Ultimately, pt being very well s/p resigning from her prior job and taking a few more weeks at home with her infant. Denies depressed mood, anxiety, SI, HI and thoughts of self harm. She is very excited about her new work position that her started several weeks ago. - praised pt for her efforts in self care; encouraged ongoing support from her mother as needed - provided strict return precautions for safety concerns in the future  2. Irregular bleeding in menstrual cycle: Pt received depo prior to discharge but given concern of irregular bleeding, pt elected to transition to OCPs at her last office visit on 10/08/20. Pt stated that her cycle has since normalized over the past several weeks. Given improvement in irregular bleeding, pt desires to continue OCPs at this time but may consider IUD at a later date. - encouraged pt to call clinic prn if any concerns or desire to switch to IUD or other BC method  3. Gestation Hypertension: Normal blood pressure in clinic today in s/p discontinuation of amlodipine at last visit on 10/08/20 per Dr. Tawni Levy instructions. No concerning symptoms. Pt has BP cuff at home. - discussed plan for ASA in prenatal period if future pregnancies  Routine preventative health maintenance measures emphasized. Please refer to After Visit Summary for other counseling recommendations.   I discussed the assessment and treatment plan with the patient. The patient was provided an opportunity to ask questions and all were answered. The patient agreed with the plan and demonstrated an understanding of the instructions.   The patient was advised to call back or seek an in-person evaluation if the symptoms worsen or if the condition fails to improve as anticipated.  I  provided 15 minutes of non-face-to-face time during this encounter.  No follow-ups on file. RTC as needed.  Sheila Oats, MD OB Fellow, Faculty Practice

## 2020-12-01 DIAGNOSIS — N939 Abnormal uterine and vaginal bleeding, unspecified: Secondary | ICD-10-CM | POA: Insufficient documentation

## 2020-12-01 DIAGNOSIS — N926 Irregular menstruation, unspecified: Secondary | ICD-10-CM | POA: Insufficient documentation

## 2021-02-07 IMAGING — US US MFM OB FOLLOW-UP
1 series · 14 of 28 positions shown · non-contrast
Comparison: none

[Series 1: us mfm ob follow-up · 44 acquisitions, 14 frames shown]
[im 2/44]
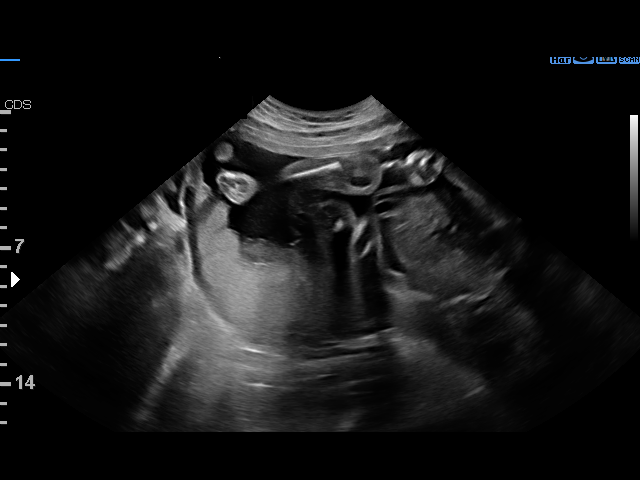
[im 5/44]
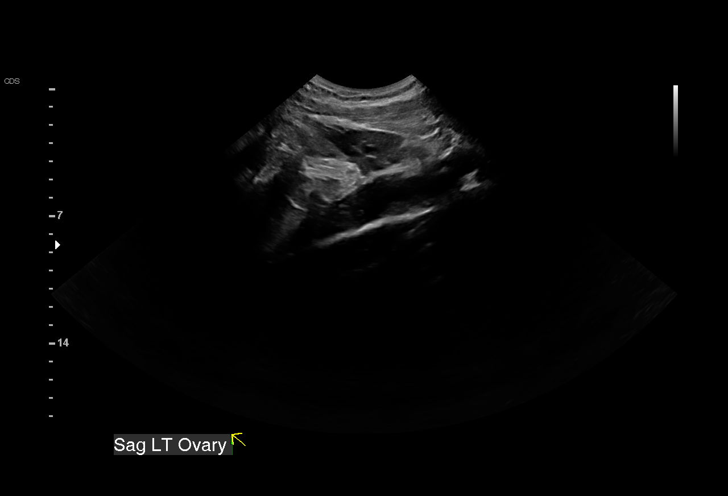
[im 8/44]
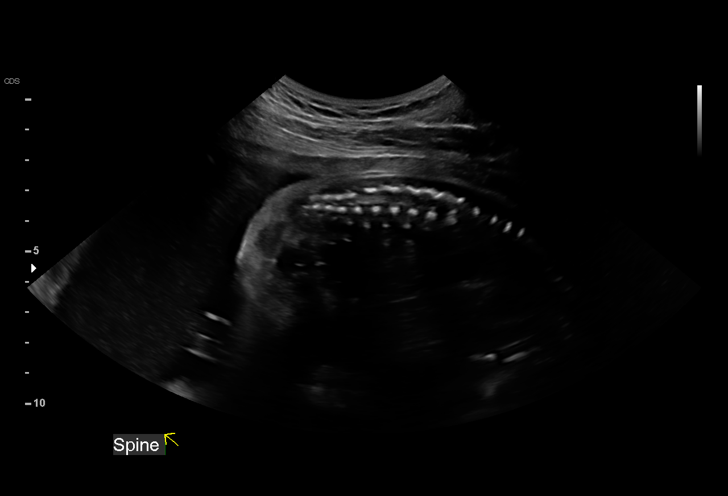
[im 12/44]
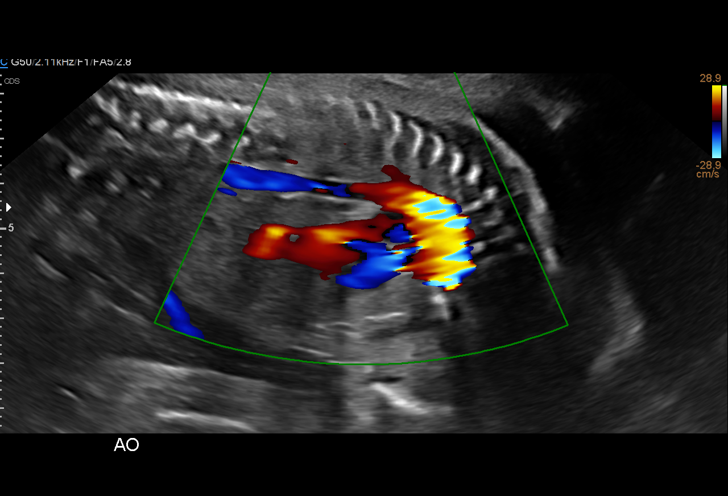
[im 15/44]
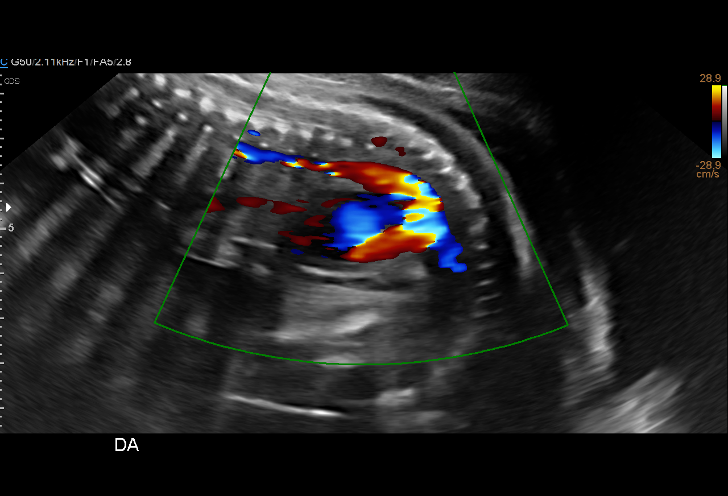
[im 18/44]
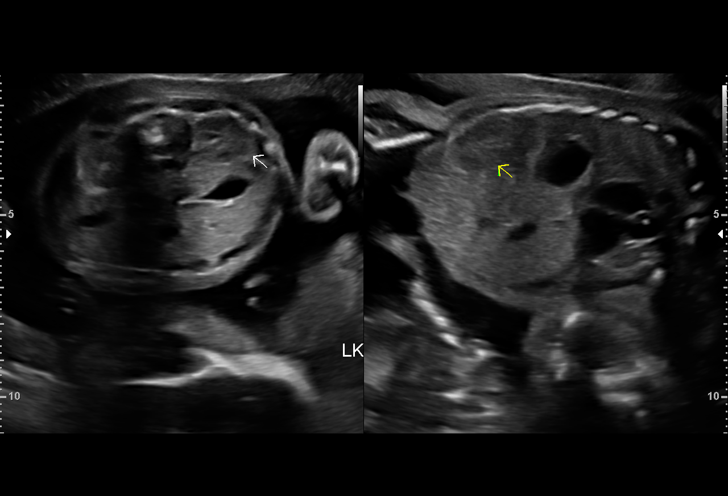
[im 21/44]
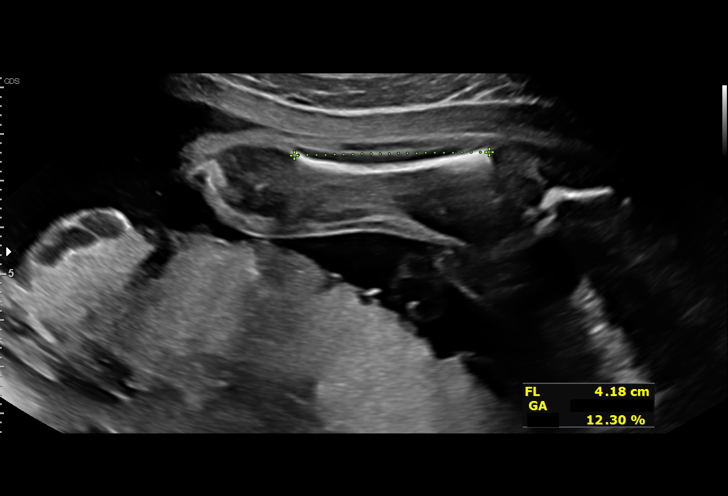
[im 24/44]
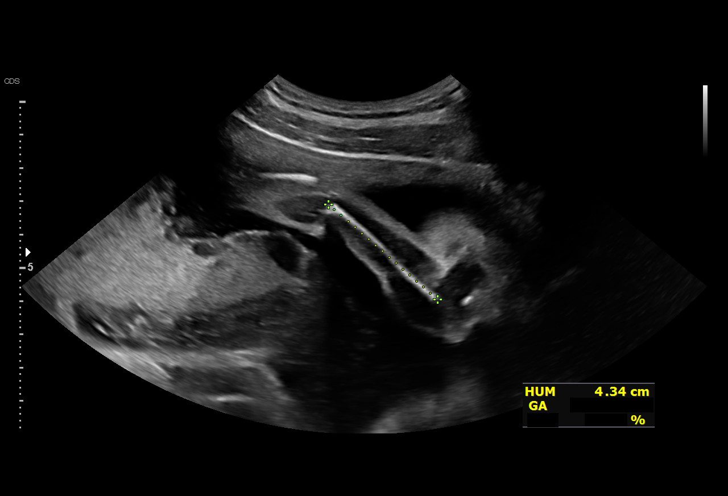
[im 28/44]
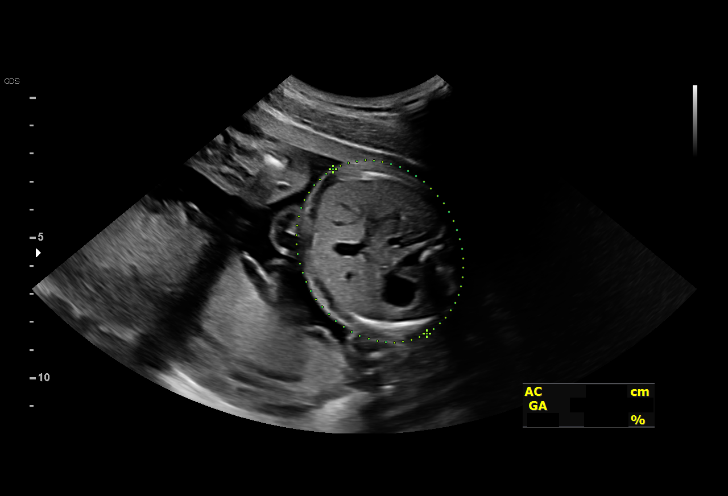
[im 31/44]
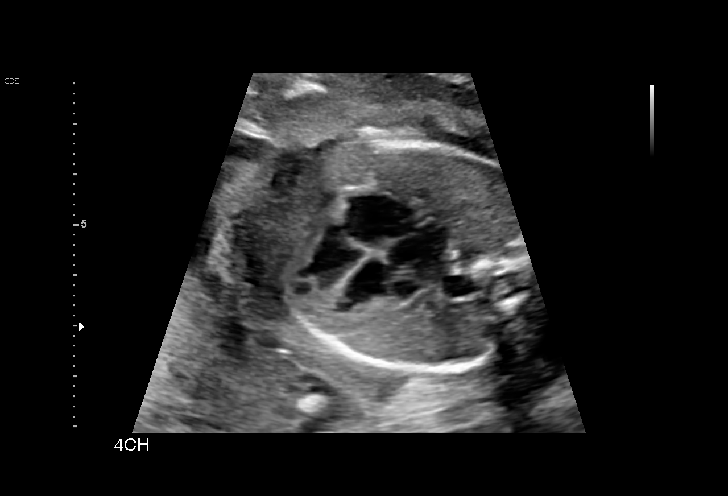
[im 34/44]
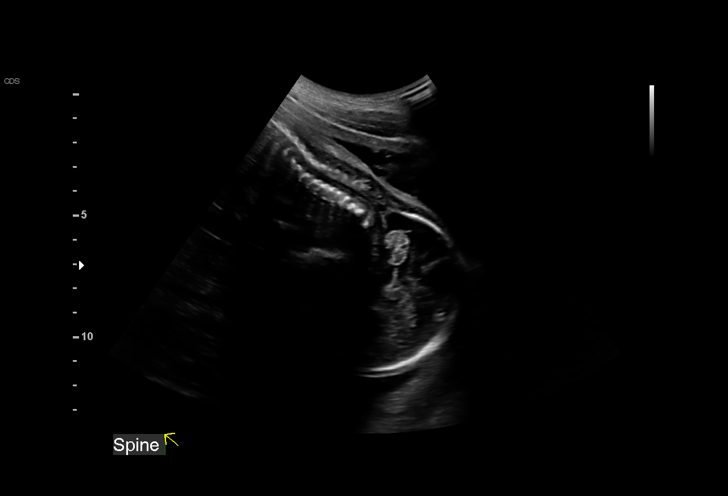
[im 37/44]
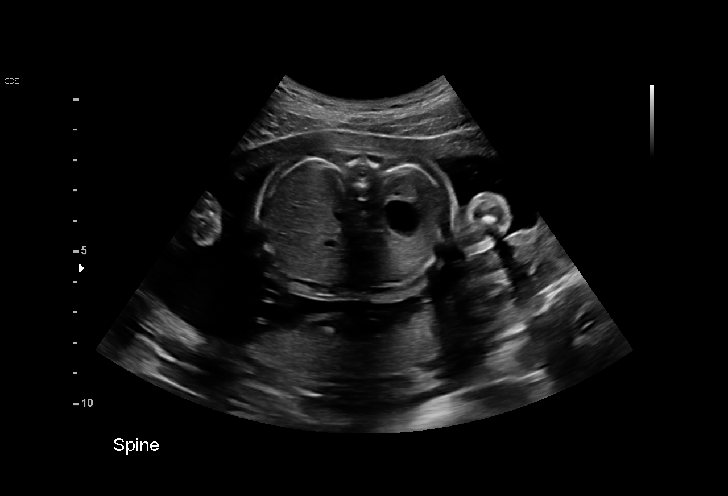
[im 40/44]
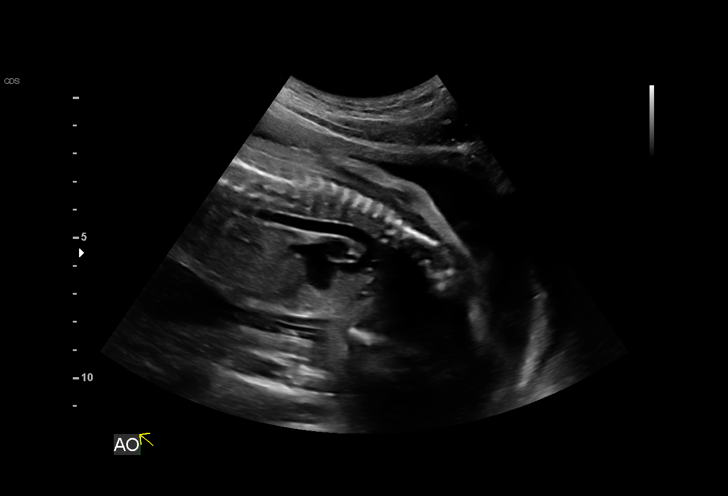
[im 44/44]
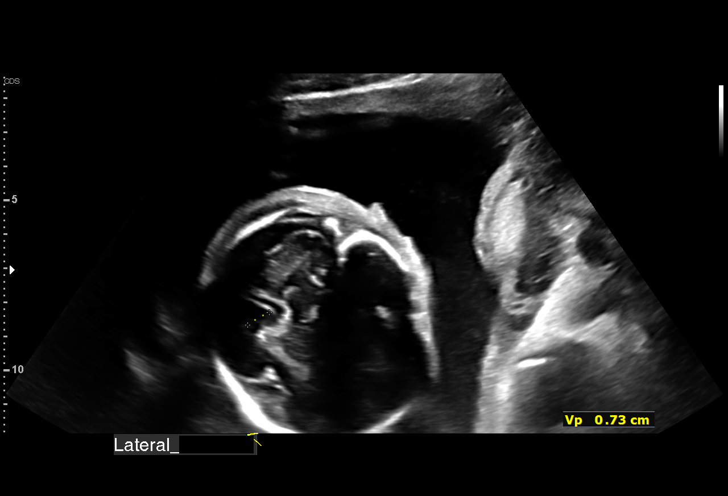

[14 of 28 positions shown; findings below may reference images not displayed]

Indications

 24 weeks gestation of pregnancy
 Echogenic intracardiac focus of the heart
 (EIF)
 Encounter for antenatal screening for
 malformations (LR NIPS, Neg Horizon, Neg
 AFP)
Fetal Evaluation

 Num Of Fetuses:         1
 Fetal Heart Rate(bpm):  153
 Cardiac Activity:       Observed
 Presentation:           Cephalic
 Placenta:               Posterior

 Amniotic Fluid
 AFI FV:      Within normal limits

                             Largest Pocket(cm)

Biometry

 BPD:      63.4  mm     G. Age:  25w 5d         80  %    CI:        77.61   %    70 - 86
                                                         FL/HC:      18.4   %    18.7 -
 HC:      227.8  mm     G. Age:  24w 6d         39  %    HC/AC:      1.19        1.04 -
 AC:      191.7  mm     G. Age:  23w 6d         21  %    FL/BPD:     66.2   %    71 - 87
 FL:         42  mm     G. Age:  23w 5d         14  %    FL/AC:      21.9   %    20 - 24
 HUM:      43.7  mm     G. Age:  26w 0d         79  %
 LV:        7.3  mm
 Est. FW:     649  gm      1 lb 7 oz     18  %
OB History

 Gravidity:    2
 TOP:          1        Living:  0
Gestational Age

 LMP:           24w 4d        Date:  11/05/19                 EDD:   08/11/20
 U/S Today:     24w 4d                                        EDD:   08/11/20
 Best:          24w 4d     Det. By:  LMP  (11/05/19)          EDD:   08/11/20
Anatomy

 Cranium:               Previously seen        LVOT:                   Previously seen
 Cavum:                 Previously seen        Aortic Arch:            Appears normal
 Ventricles:            Appears normal         Ductal Arch:            Appears normal
 Choroid Plexus:        Previously seen        Diaphragm:              Appears normal
 Cerebellum:            Previously seen        Stomach:                Appears normal, left
                                                                       sided
 Posterior Fossa:       Previously seen        Abdomen:                Previously seen
 Nuchal Fold:           Previously seen        Abdominal Wall:         Previously seen
 Face:                  Appears normal         Cord Vessels:           Previously seen
                        (orbits and profile)
 Lips:                  Previously seen        Kidneys:                Appear normal
 Palate:                Previously seen        Bladder:                Appears normal
 Thoracic:              Previously seen        Spine:                  Appears normal
 Heart:                 Appears normal         Upper Extremities:      Previously seen
                        (4CH, axis, and
                        situs)
 RVOT:                  Previously seen        Lower Extremities:      Previously seen
Cervix Uterus Adnexa

 Cervix
 Normal appearance by transabdominal scan.

 Uterus
 No abnormality visualized.

 Right Ovary
 Within normal limits.

 Left Ovary
 Within normal limits.

 Cul De Sac
 No free fluid seen.

 Adnexa
 No abnormality visualized.
Impression

 Follow up growth to complete the fetal anatomy.
 Normal interval growth with measurements consistent with
 dates
 Good fetal movement and amniotic fluid volume
 Anatomy completed today.

 A prior echogenic intracardiac focus was visualized but not
 seen today.
Recommendations

 Follow up growth as clinically indicated.

## 2021-08-27 IMAGING — US US PELVIS COMPLETE
1 series · 14 of 25 positions shown · non-contrast
Comparison: None

CLINICAL DATA: Postpartum bleeding, abnormal vaginal bleeding
postpartum on 08/06/2020 with bleeding until 11/05/2020

EXAM:
TRANSABDOMINAL ULTRASOUND OF PELVIS
TECHNIQUE: Transabdominal ultrasound examination of the pelvis was performed
including evaluation of the uterus, ovaries, adnexal regions, and
pelvic cul-de-sac. Transvaginal imaging was not performed.

[Series 1: us pelvis complete · 14 of 35 slices shown]
[im 1/35]
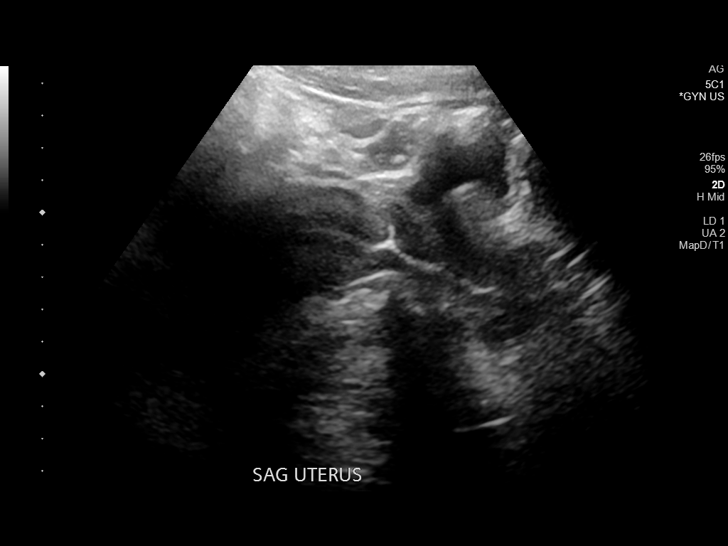
[im 3/35]
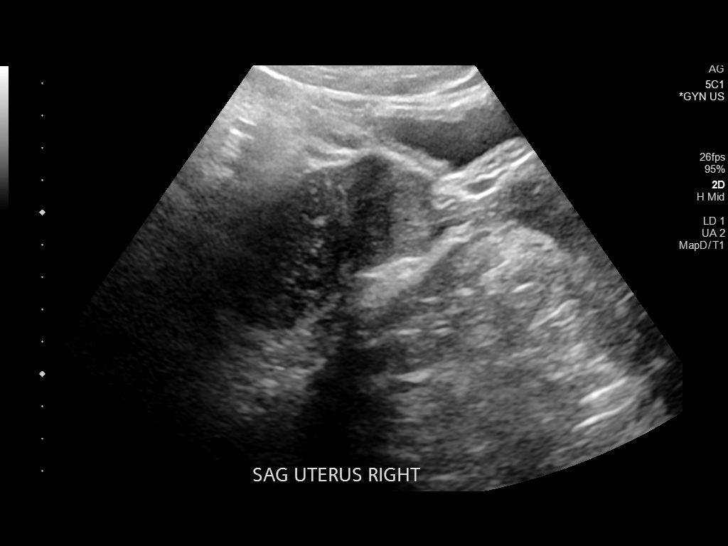
[im 6/35]
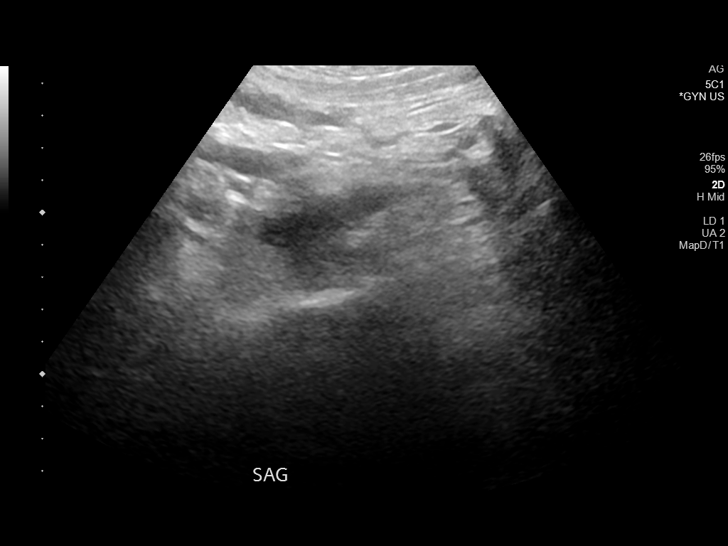
[im 9/35]
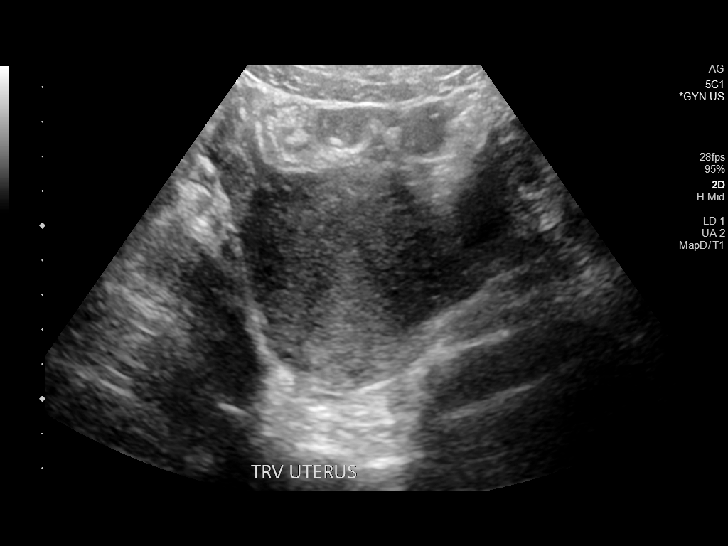
[im 12/35]
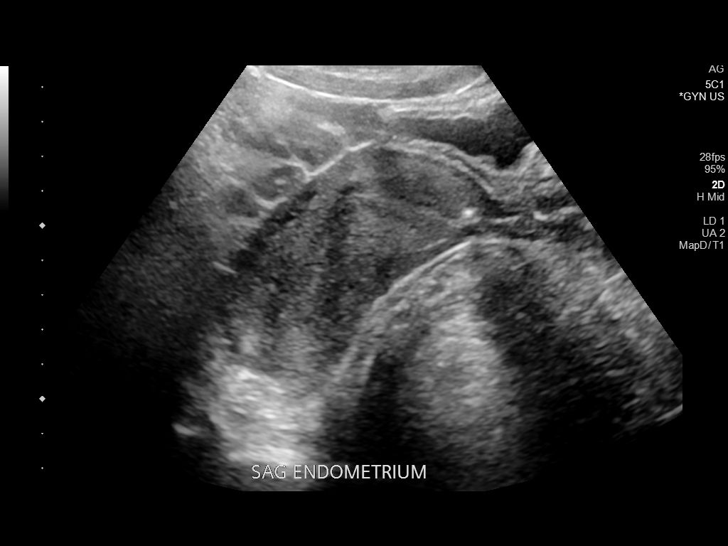
[im 13/35]
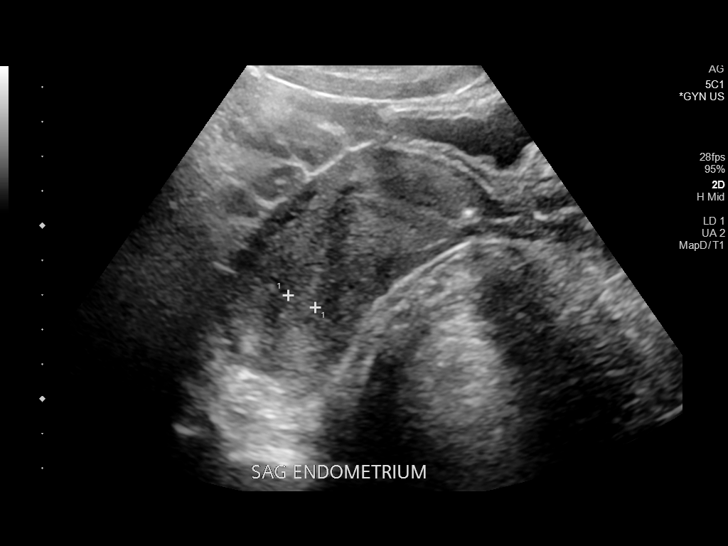
[im 16/35]
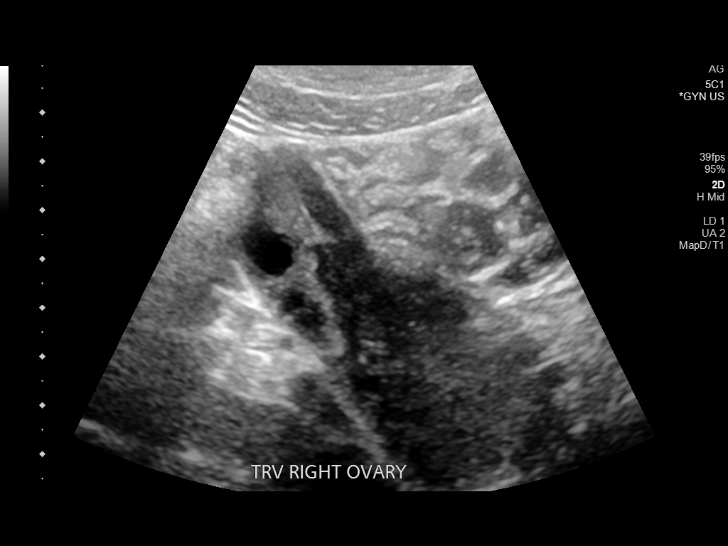
[im 19/35]
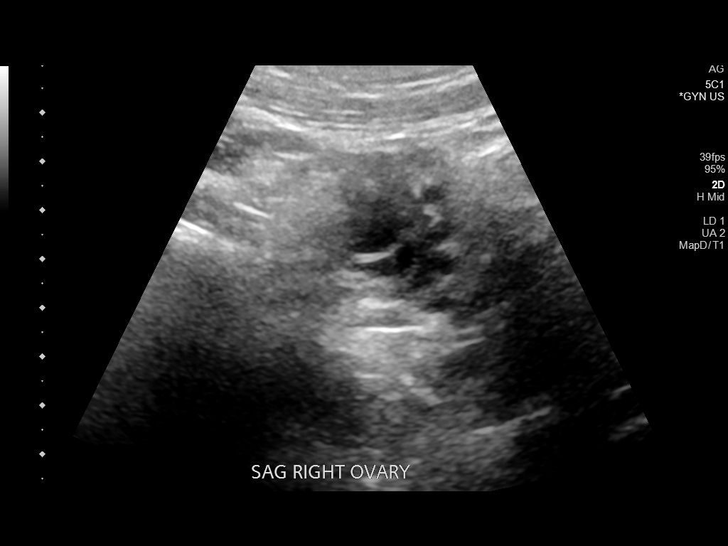
[im 22/35]
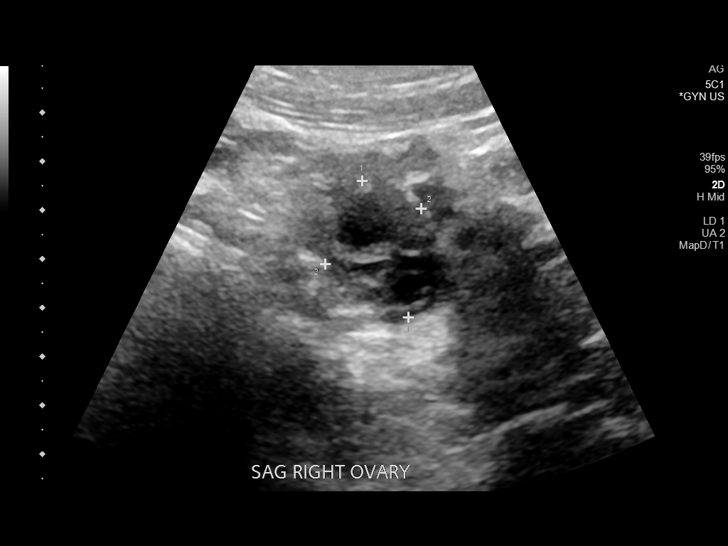
[im 23/35]
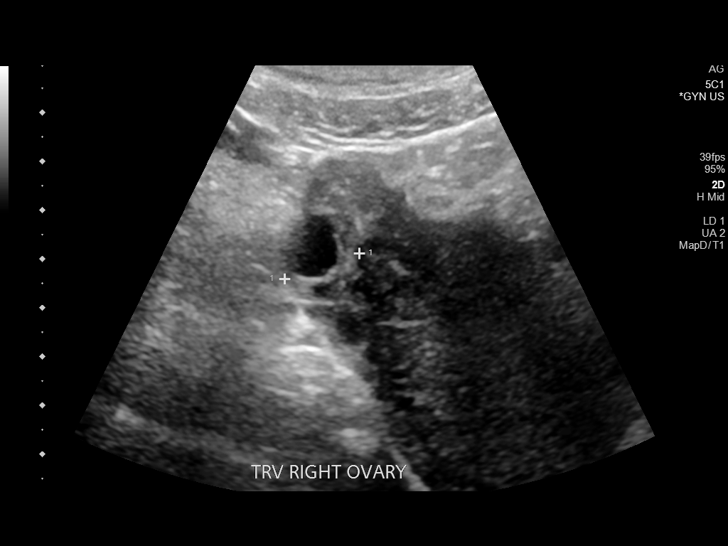
[im 26/35]
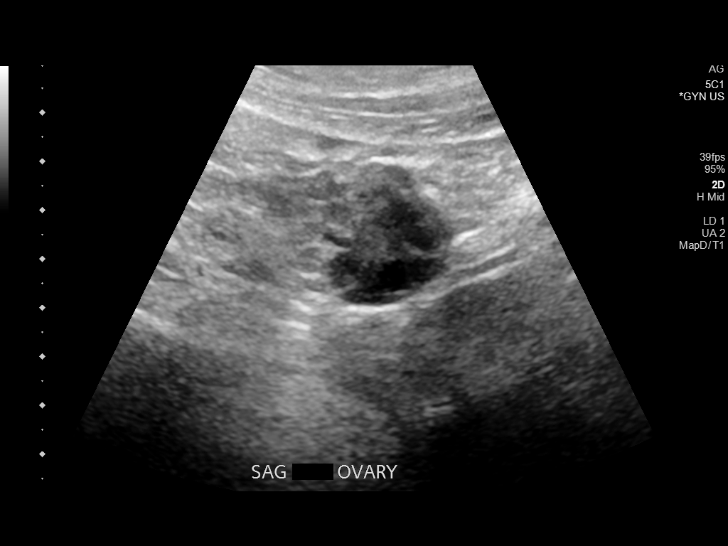
[im 29/35]
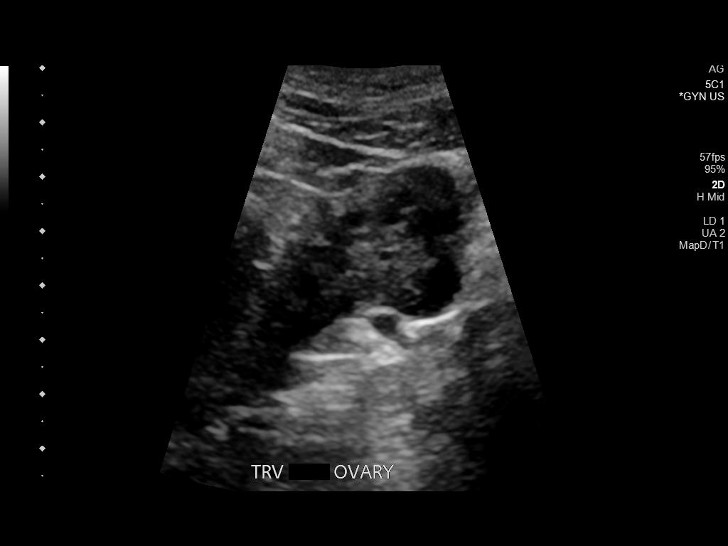
[im 32/35]
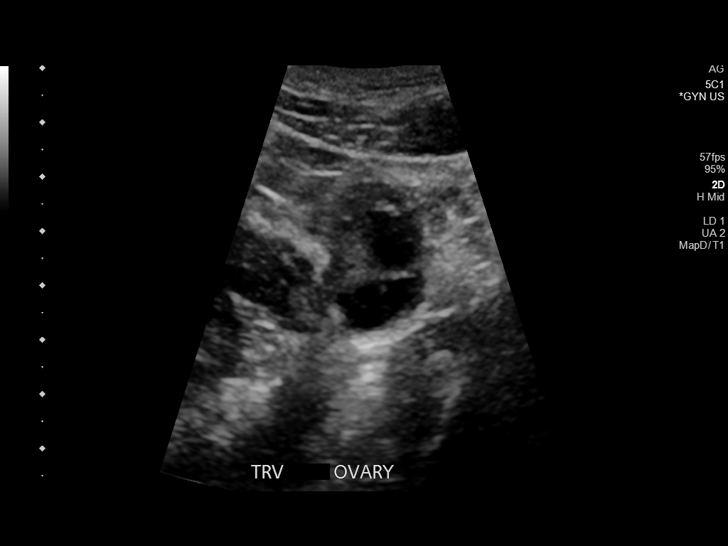
[im 35/35]
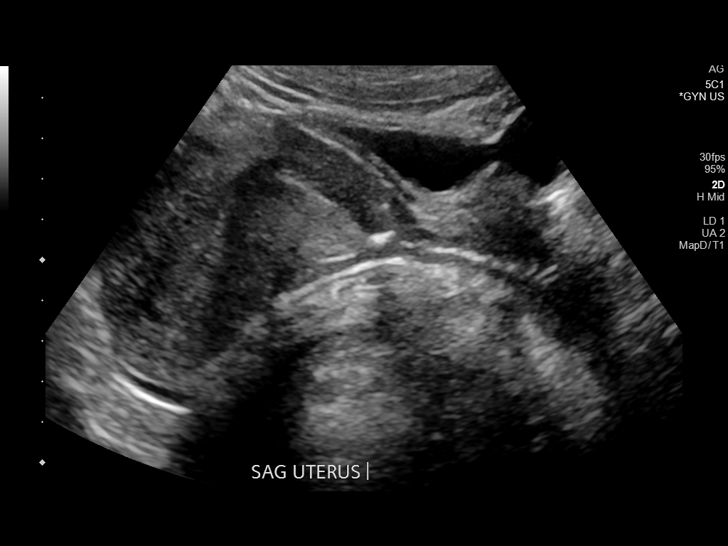

[14 of 25 positions shown; findings below may reference images not displayed]

FINDINGS: Uterus

Measurements: 8.5 x 4.3 x 6.2 cm = volume: 119 mL. Retroflexed.
Normal morphology without mass

Endometrium

Thickness: 9 mm. No endometrial fluid or focal abnormality. No
abnormal echogenicity within the endometrial complex.

Right ovary

Measurements: 3.0 x 2.3 x 1.6 cm = volume: 6 mL. Normal morphology
without mass

Left ovary

Measurements: 3.0 x 3.1 x 2.5 cm = volume: 12.1 mL. Normal
morphology without mass

Other findings:  No free pelvic fluid.  No adnexal masses.
IMPRESSION: No pelvic sonographic abnormalities identified.

## 2021-08-30 ENCOUNTER — Encounter (HOSPITAL_COMMUNITY): Payer: Self-pay | Admitting: Emergency Medicine

## 2021-08-30 ENCOUNTER — Ambulatory Visit (HOSPITAL_COMMUNITY)
Admission: EM | Admit: 2021-08-30 | Discharge: 2021-08-30 | Disposition: A | Discharge status: Home / Self Care | Payer: Medicaid Other | Attending: Family Medicine | Admitting: Family Medicine

## 2021-08-30 DIAGNOSIS — H6123 Impacted cerumen, bilateral: Secondary | ICD-10-CM

## 2021-08-30 DIAGNOSIS — H60503 Unspecified acute noninfective otitis externa, bilateral: Secondary | ICD-10-CM

## 2021-08-30 MED ORDER — AMOXICILLIN 875 MG PO TABS: 875.0000 mg | ORAL_TABLET | Freq: Two times a day (BID) | ORAL | 0 refills | Status: AC

## 2021-08-30 MED ORDER — IBUPROFEN 800 MG PO TABS: 800.0000 mg | ORAL_TABLET | Freq: Three times a day (TID) | ORAL | 0 refills | Status: DC | PRN

## 2021-08-30 MED ORDER — NEOMYCIN-POLYMYXIN-HC 3.5-10000-1 OT SOLN: 3.0000 [drp] | Freq: Four times a day (QID) | OTIC | 0 refills | Status: DC

## 2021-08-30 NOTE — ED Triage Notes (Signed)
Pt c/o left ear pain and feels clogged that started yesterday but worse when woke up today.  ?

## 2021-08-30 NOTE — ED Provider Notes (Addendum)
?[facility] ? ? ? ?CSN: [id number] ?Arrival date & time: 08/30/21  1131 ? ? ?  ? ?History   ?Chief Complaint ?Chief Complaint  ?Patient presents with  ? Otalgia  ? ? ?HPI ?Alexandra Freeman is a 26 y.o. female.  ? ? ?Otalgia ?Here for left ear pain that began yesterday.  This morning she awoke and it felt worse and now she is having trouble hearing out of that ear. ?No fever, cough or URI symptoms. ? ?Past Medical History:  ?Diagnosis Date  ? Medical history non-contributory   ? Menorrhagia   ? ? ?Patient Active Problem List  ? Diagnosis Date Noted  ? Abnormal bleeding in menstrual cycle 12/01/2020  ? Postpartum depression 10/08/2020  ? Gestational hypertension 07/31/2020  ? Irritable bowel syndrome with diarrhea 06/14/2020  ? ? ?Past Surgical History:  ?Procedure Laterality Date  ? NO PAST SURGERIES    ? ? ?OB History   ? ? Gravida  ?2  ? Para  ?1  ? Term  ?1  ? Preterm  ?0  ? AB  ?1  ? Living  ?1  ?  ? ? SAB  ?0  ? IAB  ?1  ? Ectopic  ?0  ? Multiple  ?0  ? Live Births  ?1  ?   ?  ?  ? ? ? ?Home Medications   ? ?Prior to Admission medications   ?Medication Sig Start Date End Date Taking? Authorizing Provider  ?amoxicillin (AMOXIL) 875 MG tablet Take 1 tablet (875 mg total) by mouth 2 (two) times daily for 7 days. 08/30/21 09/06/21 Yes Avyan Livesay, Gwenlyn Perking, MD  ?ibuprofen (ADVIL) 800 MG tablet Take 1 tablet (800 mg total) by mouth every 8 (eight) hours as needed (pain). 08/30/21  Yes Barrett Henle, MD  ?neomycin-polymyxin-hydrocortisone (CORTISPORIN) OTIC solution Place 3 drops into both ears 4 (four) times daily. 08/30/21  Yes Barrett Henle, MD  ?norgestimate-ethinyl estradiol (ORTHO-CYCLEN) 0.25-35 MG-MCG tablet Take 1 tablet by mouth daily. 10/25/20   Donnamae Jude, MD  ?Prenatal Vit-Fe Fumarate-FA (PRENATAL MULTIVITAMIN) TABS tablet Take 1 tablet by mouth daily at 12 noon. ?Patient not taking: Reported on 11/29/2020    [provider]  ? ? ?Family History ?Family History  ?Problem  Relation Age of Onset  ? Polymyositis Mother   ? ? ?Social History ?Social History  ? ?Tobacco Use  ? Smoking status: Never  ? Smokeless tobacco: Never  ?Vaping Use  ? Vaping Use: Never used  ?Substance Use Topics  ? Alcohol use: No  ? Drug use: No  ? ? ? ?Allergies   ?Patient has no known allergies. ? ? ?Review of Systems ?Review of Systems  ?HENT:  Positive for ear pain.   ? ? ?Physical Exam ?Triage Vital Signs ?ED Triage Vitals  ?Enc Vitals Group  ?   BP 08/30/21 1215 112/69  ?   Pulse Rate 08/30/21 1215 78  ?   Resp 08/30/21 1215 17  ?   Temp 08/30/21 1215 98.3 ?F (36.8 ?C)  ?   Temp Source 08/30/21 1215 Oral  ?   SpO2 08/30/21 1215 100 %  ?   Weight --   ?   Height --   ?   Head Circumference --   ?   Peak Flow --   ?   Pain Score 08/30/21 1214 8  ?   Pain Loc --   ?   Pain Edu? --   ?   Excl.  in [place]? --   ? ?No data found. ? ?Updated Vital Signs ?BP 112/69 (BP Location: Left Arm)   Pulse 78   Temp 98.3 ?F (36.8 ?C) (Oral)   Resp 17   LMP 08/10/2021   SpO2 100%  ? ?Visual Acuity ?Right Eye Distance:   ?Left Eye Distance:   ?Bilateral Distance:   ? ?Right Eye Near:   ?Left Eye Near:    ?Bilateral Near:    ? ?Physical Exam ?Vitals reviewed.  ?Constitutional:   ?   General: She is not in acute distress. ?   Appearance: She is not toxic-appearing.  ?HENT:  ?   Ears:  ?   Comments: TMs are bilaterally obscured by cerumen. ?   Nose: Nose normal.  ?   Mouth/Throat:  ?   Mouth: Mucous membranes are moist.  ?   Pharynx: No oropharyngeal exudate or posterior oropharyngeal erythema.  ?Eyes:  ?   Extraocular Movements: Extraocular movements intact.  ?   Pupils: Pupils are equal, round, and reactive to light.  ?Cardiovascular:  ?   Rate and Rhythm: Normal rate and regular rhythm.  ?   Heart sounds: No murmur heard. ?Pulmonary:  ?   Effort: Pulmonary effort is normal.  ?   Breath sounds: Normal breath sounds.  ?Musculoskeletal:  ?   Cervical back: Neck supple.  ?Lymphadenopathy:  ?   Cervical: No cervical adenopathy.   ?Skin: ?   Coloration: Skin is not jaundiced or pale.  ?Neurological:  ?   Mental Status: She is alert and oriented to person, place, and time.  ?Psychiatric:     ?   Behavior: Behavior normal.  ? ? ? ?UC Treatments / Results  ?Labs ?(all labs ordered are listed, but only abnormal results are displayed) ?Labs Reviewed - No data to display ? ?EKG ? ? ?Radiology ?No results found. ? ?Procedures ?Procedures (including critical care time) ? ?Medications Ordered in UC ?Medications - No data to display ? ?Initial Impression / Assessment and Plan / UC Course  ?I have reviewed the triage vital signs and the nursing notes. ? ?Pertinent labs & imaging results that were available during my care of the patient were reviewed by me and considered in my medical decision making (see chart for details). ? ?  ? ?After ear lavage bilaterally left ear is still hurting some.  The hearing is better.  There is white discharge in both ear canals and some erythema both ear canals.  Also the tympanic membranes are a little dull and mildly pink.  Therefore we will treat with oral antibiotics and drops ?Final Clinical Impressions(s) / UC Diagnoses  ? ?Final diagnoses:  ?Acute otitis externa of both ears, unspecified type  ?Bilateral impacted cerumen  ? ? ? ?Discharge Instructions   ? ?  ?Take amoxicillin 875 mg--1 tab twice daily for 7 days ? ?Take ibuprofen 800 mg--1 every 8 hours as needed for pain ? ?Use Cortisporin drops in both ears --3 to 4 drops in each ear 4 times daily for 7 days ? ? ? ? ?ED Prescriptions   ? ? Medication Sig Dispense Auth. Provider  ? amoxicillin (AMOXIL) 875 MG tablet Take 1 tablet (875 mg total) by mouth 2 (two) times daily for 7 days. 14 tablet Karletta Millay, Gwenlyn Perking, MD  ? neomycin-polymyxin-hydrocortisone (CORTISPORIN) OTIC solution Place 3 drops into both ears 4 (four) times daily. 10 mL Barrett Henle, MD  ? ibuprofen (ADVIL) 800 MG tablet Take 1 tablet (800 mg total)  by mouth every 8 (eight) hours as needed  (pain). 21 tablet Windy Carina Gwenlyn Perking, MD  ? ?  ? ?PDMP not reviewed this encounter. ?  ?Barrett Henle, MD ?08/30/21 1319 ? ?  ?Barrett Henle, MD ?08/30/21 1357 ? ?

## 2021-08-30 NOTE — Discharge Instructions (Addendum)
Take amoxicillin 875 mg--1 tab twice daily for 7 days ? ?Take ibuprofen 800 mg--1 every 8 hours as needed for pain ? ?Use Cortisporin drops in both ears --3 to 4 drops in each ear 4 times daily for 7 days ?

## 2022-07-20 ENCOUNTER — Emergency Department (HOSPITAL_COMMUNITY): Payer: Medicaid Other

## 2022-07-20 ENCOUNTER — Inpatient Hospital Stay (HOSPITAL_COMMUNITY)
Admission: EM | Admit: 2022-07-20 | Discharge: 2022-07-31 | DRG: 275 | Disposition: A | Discharge status: Home / Self Care | Payer: Medicaid Other | Attending: Internal Medicine | Admitting: Internal Medicine

## 2022-07-20 DIAGNOSIS — J9601 Acute respiratory failure with hypoxia: Secondary | ICD-10-CM | POA: Diagnosis present

## 2022-07-20 DIAGNOSIS — N179 Acute kidney failure, unspecified: Secondary | ICD-10-CM

## 2022-07-20 DIAGNOSIS — F05 Delirium due to known physiological condition: Secondary | ICD-10-CM | POA: Diagnosis not present

## 2022-07-20 DIAGNOSIS — E876 Hypokalemia: Secondary | ICD-10-CM | POA: Diagnosis not present

## 2022-07-20 DIAGNOSIS — I469 Cardiac arrest, cause unspecified: Principal | ICD-10-CM | POA: Diagnosis present

## 2022-07-20 DIAGNOSIS — R739 Hyperglycemia, unspecified: Secondary | ICD-10-CM | POA: Diagnosis present

## 2022-07-20 DIAGNOSIS — K7200 Acute and subacute hepatic failure without coma: Secondary | ICD-10-CM | POA: Diagnosis present

## 2022-07-20 DIAGNOSIS — G9340 Encephalopathy, unspecified: Secondary | ICD-10-CM

## 2022-07-20 DIAGNOSIS — Z781 Physical restraint status: Secondary | ICD-10-CM

## 2022-07-20 DIAGNOSIS — Z793 Long term (current) use of hormonal contraceptives: Secondary | ICD-10-CM

## 2022-07-20 DIAGNOSIS — J9600 Acute respiratory failure, unspecified whether with hypoxia or hypercapnia: Secondary | ICD-10-CM

## 2022-07-20 DIAGNOSIS — G9341 Metabolic encephalopathy: Secondary | ICD-10-CM | POA: Diagnosis present

## 2022-07-20 DIAGNOSIS — X31XXXA Exposure to excessive natural cold, initial encounter: Secondary | ICD-10-CM

## 2022-07-20 DIAGNOSIS — D696 Thrombocytopenia, unspecified: Secondary | ICD-10-CM | POA: Diagnosis not present

## 2022-07-20 DIAGNOSIS — T68XXXA Hypothermia, initial encounter: Secondary | ICD-10-CM | POA: Diagnosis present

## 2022-07-20 DIAGNOSIS — R112 Nausea with vomiting, unspecified: Secondary | ICD-10-CM

## 2022-07-20 DIAGNOSIS — J690 Pneumonitis due to inhalation of food and vomit: Secondary | ICD-10-CM

## 2022-07-20 DIAGNOSIS — I4901 Ventricular fibrillation: Secondary | ICD-10-CM

## 2022-07-20 DIAGNOSIS — D649 Anemia, unspecified: Secondary | ICD-10-CM | POA: Diagnosis not present

## 2022-07-20 DIAGNOSIS — E872 Acidosis, unspecified: Secondary | ICD-10-CM | POA: Diagnosis present

## 2022-07-20 DIAGNOSIS — Z1152 Encounter for screening for COVID-19: Secondary | ICD-10-CM

## 2022-07-20 DIAGNOSIS — G4700 Insomnia, unspecified: Secondary | ICD-10-CM | POA: Diagnosis not present

## 2022-07-20 LAB — I-STAT VENOUS BLOOD GAS, ED
Acid-base deficit: 9 mmol/L — ABNORMAL HIGH (ref 0.0–2.0)
Bicarbonate: 18.1 mmol/L — ABNORMAL LOW (ref 20.0–28.0)
Calcium, Ion: 1.14 mmol/L — ABNORMAL LOW (ref 1.15–1.40)
HCT: 40 % (ref 36.0–46.0)
Hemoglobin: 13.6 g/dL (ref 12.0–15.0)
O2 Saturation: 99 %
Potassium: 3.4 mmol/L — ABNORMAL LOW (ref 3.5–5.1)
Sodium: 142 mmol/L (ref 135–145)
TCO2: 19 mmol/L — ABNORMAL LOW (ref 22–32)
pCO2, Ven: 41.5 mmHg — ABNORMAL LOW (ref 44–60)
pH, Ven: 7.248 — ABNORMAL LOW (ref 7.25–7.43)
pO2, Ven: 191 mmHg — ABNORMAL HIGH (ref 32–45)

## 2022-07-20 LAB — CBC WITH DIFFERENTIAL/PLATELET
Abs Immature Granulocytes: 0.09 10*3/uL — ABNORMAL HIGH (ref 0.00–0.07)
Basophils Absolute: 0 10*3/uL (ref 0.0–0.1)
Basophils Relative: 0 %
Eosinophils Absolute: 0 10*3/uL (ref 0.0–0.5)
Eosinophils Relative: 0 %
HCT: 37.4 % (ref 36.0–46.0)
Hemoglobin: 12.4 g/dL (ref 12.0–15.0)
Immature Granulocytes: 1 %
Lymphocytes Relative: 30 %
Lymphs Abs: 3.1 10*3/uL (ref 0.7–4.0)
MCH: 30.8 pg (ref 26.0–34.0)
MCHC: 33.2 g/dL (ref 30.0–36.0)
MCV: 93 fL (ref 80.0–100.0)
Monocytes Absolute: 0.3 10*3/uL (ref 0.1–1.0)
Monocytes Relative: 3 %
Neutro Abs: 6.7 10*3/uL (ref 1.7–7.7)
Neutrophils Relative %: 66 %
Platelets: 217 10*3/uL (ref 150–400)
RBC: 4.02 MIL/uL (ref 3.87–5.11)
RDW: 14.6 % (ref 11.5–15.5)
WBC: 10.3 10*3/uL (ref 4.0–10.5)
nRBC: 0 % (ref 0.0–0.2)

## 2022-07-20 LAB — I-STAT ARTERIAL BLOOD GAS, ED
Acid-base deficit: 9 mmol/L — ABNORMAL HIGH (ref 0.0–2.0)
Bicarbonate: 16.8 mmol/L — ABNORMAL LOW (ref 20.0–28.0)
Calcium, Ion: 1.13 mmol/L — ABNORMAL LOW (ref 1.15–1.40)
HCT: 38 % (ref 36.0–46.0)
Hemoglobin: 12.9 g/dL (ref 12.0–15.0)
O2 Saturation: 100 %
Patient temperature: 35.2
Potassium: 3.5 mmol/L (ref 3.5–5.1)
Sodium: 141 mmol/L (ref 135–145)
TCO2: 18 mmol/L — ABNORMAL LOW (ref 22–32)
pCO2 arterial: 33.2 mmHg (ref 32–48)
pH, Arterial: 7.304 — ABNORMAL LOW (ref 7.35–7.45)
pO2, Arterial: 353 mmHg — ABNORMAL HIGH (ref 83–108)

## 2022-07-20 LAB — URINALYSIS, MICROSCOPIC (REFLEX)

## 2022-07-20 LAB — BASIC METABOLIC PANEL
Anion gap: 11 (ref 5–15)
BUN: 11 mg/dL (ref 6–20)
CO2: 18 mmol/L — ABNORMAL LOW (ref 22–32)
Calcium: 8.3 mg/dL — ABNORMAL LOW (ref 8.9–10.3)
Chloride: 108 mmol/L (ref 98–111)
Creatinine, Ser: 1.28 mg/dL — ABNORMAL HIGH (ref 0.44–1.00)
GFR, Estimated: 59 mL/min — ABNORMAL LOW (ref >60)
Glucose, Bld: 185 mg/dL — ABNORMAL HIGH (ref 70–99)
Potassium: 3.4 mmol/L — ABNORMAL LOW (ref 3.5–5.1)
Sodium: 137 mmol/L (ref 135–145)

## 2022-07-20 LAB — RAPID URINE DRUG SCREEN, HOSP PERFORMED
Amphetamines: NOT DETECTED
Barbiturates: NOT DETECTED
Benzodiazepines: NOT DETECTED
Cocaine: NOT DETECTED
Opiates: NOT DETECTED
Tetrahydrocannabinol: POSITIVE — AB

## 2022-07-20 LAB — URINALYSIS, ROUTINE W REFLEX MICROSCOPIC
Bilirubin Urine: NEGATIVE
Glucose, UA: 100 mg/dL — AB
Ketones, ur: NEGATIVE mg/dL
Leukocytes,Ua: NEGATIVE
Nitrite: NEGATIVE
Protein, ur: 100 mg/dL — AB
Specific Gravity, Urine: >1.03 — ABNORMAL HIGH (ref 1.005–1.030)
pH: 7 (ref 5.0–8.0)

## 2022-07-20 LAB — I-STAT BETA HCG BLOOD, ED (MC, WL, AP ONLY): I-stat hCG, quantitative: <5 m[IU]/mL (ref <5)

## 2022-07-20 LAB — TROPONIN I (HIGH SENSITIVITY): Troponin I (High Sensitivity): 107 ng/L (ref <18)

## 2022-07-20 LAB — ETHANOL: Alcohol, Ethyl (B): <10 mg/dL (ref <10)

## 2022-07-20 MED ORDER — FENTANYL 2500MCG IN NS 250ML (10MCG/ML) PREMIX INFUSION
0.0000 ug/h | INTRAVENOUS | Status: DC
  Administered 2022-07-20: 25 ug/h via INTRAVENOUS
  Filled 2022-07-20: qty 250

## 2022-07-20 MED ORDER — CALCIUM GLUCONATE-NACL 1-0.675 GM/50ML-% IV SOLN
1.0000 g | Freq: Once | INTRAVENOUS | Status: AC
  Administered 2022-07-21: 1000 mg via INTRAVENOUS
  Filled 2022-07-20: qty 50

## 2022-07-20 MED ORDER — LACTATED RINGERS IV BOLUS
1000.0000 mL | Freq: Once | INTRAVENOUS | Status: AC
  Administered 2022-07-21: 1000 mL via INTRAVENOUS

## 2022-07-20 MED ORDER — POTASSIUM CHLORIDE 20 MEQ PO PACK
40.0000 meq | PACK | Freq: Once | ORAL | Status: AC
  Administered 2022-07-21: 40 meq
  Filled 2022-07-20: qty 2

## 2022-07-20 MED ORDER — POTASSIUM CHLORIDE 10 MEQ/100ML IV SOLN
10.0000 meq | INTRAVENOUS | Status: AC
  Administered 2022-07-21 (×3): 10 meq via INTRAVENOUS
  Filled 2022-07-20 (×3): qty 100

## 2022-07-20 MED ORDER — MAGNESIUM SULFATE 2 GM/50ML IV SOLN
2.0000 g | Freq: Once | INTRAVENOUS | Status: AC
  Administered 2022-07-21: 2 g via INTRAVENOUS
  Filled 2022-07-20: qty 50

## 2022-07-20 MED ORDER — ROCURONIUM BROMIDE 10 MG/ML (PF) SYRINGE
70.0000 mg | PREFILLED_SYRINGE | Freq: Once | INTRAVENOUS | Status: AC
  Administered 2022-07-20: 70 mg via INTRAVENOUS

## 2022-07-20 MED ORDER — ETOMIDATE 2 MG/ML IV SOLN
30.0000 mg | Freq: Once | INTRAVENOUS | Status: AC
  Administered 2022-07-20: 30 mg via INTRAVENOUS

## 2022-07-20 MED ORDER — SODIUM CHLORIDE 0.9 % IV BOLUS
1000.0000 mL | Freq: Once | INTRAVENOUS | Status: AC
  Administered 2022-07-20: 1000 mL via INTRAVENOUS

## 2022-07-20 MED ORDER — IOHEXOL 350 MG/ML SOLN
75.0000 mL | Freq: Once | INTRAVENOUS | Status: AC | PRN
  Administered 2022-07-21: 75 mL via INTRAVENOUS

## 2022-07-20 NOTE — ED Notes (Signed)
Returned from CT scan.

## 2022-07-20 NOTE — H&P (Addendum)
NAME:  Alexandra Freeman, MRN:  [id number], DOB:  22-Apr-1996, LOS: 0 ADMISSION DATE:  07/20/2022, CONSULTATION DATE:  2/18 REFERRING MD:  Dr. Langston Masker, CHIEF COMPLAINT:  cardiac arrest   History of Present Illness:  Patient is a 27 year old female pertinent PMH menorrhagia on birth control presents to [facility] ED on 2/18 cardiac arrest.  Per EMS, patient was at home with family or friends reportedly smoking marijuana.  Patient became unresponsive and EMS called.  Down for approximately 6 to 7 minutes with no CPR prior to EMS arrival.  Patient found to be in V-fib arrest, defibrillated, and received 11 minutes of CPR until ROSC. Patient post ROSC in vtach was then cardioverted and given amio with sinus rhythm. Patient transported to [facility].  In route to [facility], patient biting at igel supraglottic airway and was given 50 mics of fentanyl.  Upon arrival to [facility] ED, patient vitals stable moving extremities.  Temp 95.1 F.  Airway exchanged for ETT. CXR showing ETT in good position. CTA chest without PE. CT head no acute abnormality. EKG qtc 495, no ST elevations. ABG 7.24, 41, 191, 18.  Potassium 3.4, glucose 185, creatinine 1.28, calcium 8.3.  Troponin 107.  Ethanol WNL.  UDS positive for THC.  UA unremarkable.  PCCM consulted for ICU admission.  Pertinent  Medical History   Past Medical History:  Diagnosis Date   Medical history non-contributory    Menorrhagia      Significant Hospital Events: Including procedures, antibiotic start and stop dates in addition to other pertinent events   2/18 admitted post cardiac arrest  Interim History / Subjective:  See above  Objective   Blood pressure (!) 134/99, pulse (!) 158, temperature (!) 95.4 F (35.2 C), resp. rate 18, height 5' 5"$  (1.651 m), SpO2 100 %, not currently breastfeeding.    Vent Mode: PRVC FiO2 (%):  [100 %] 100 % Set Rate:  [18 bmp] 18 bmp Vt Set:  [500 mL] 500 mL PEEP:  [5 cmH20] 5 cmH20   Intake/Output Summary (Last 24 hours) at  07/20/2022 2335 Last data filed at 07/20/2022 2309 Gross per 24 hour  Intake 1000 ml  Output --  Net 1000 ml   There were no vitals filed for this visit.  Examination: General:   critically ill appearing young female on mech vent HEENT: MM pink/moist; ETT in place Neuro: sedate (recently received RSI meds); PERRL CV: s1s2, RRR, no m/r/g PULM:  dim clear BS bilaterally; on mech vent PRVC GI: soft, bsx4 active  Extremities: warm/dry, no edema  Skin: no rashes or lesions    Resolved Hospital Problem list     Assessment & Plan:  Cardiac arrest -patient was at home with family or friends reportedly smoking marijuana.  Patient became unresponsive and EMS called.  Down for approximately 6 to 7 minutes with no CPR prior to EMS arrival.  Patient found to be in V-fib arrest, defibrillated, and received 11 minutes of CPR until ROSC. Patient post ROSC in vtach was then cardioverted and given amio with sinus rhythm. Hypokalemia/hypocalcemia -UDS positive for THC; ethanol WNL -CTA chest no PE Plan: -will admit to ICU w/ continuous telemetry monitoring -trend troponin and lactate -Replete K, calcium, mag; add on mag to previous collection; trend and replete electrolytes as needed -echo -send salicylate, acetaminophen level -TTM normothermia protocol in place  Acute respiratory failure due to above Plan: -LTVV strategy with tidal volumes of 6-8 cc/kg ideal body weight -Wean PEEP/FiO2 for SpO2 >92% -VAP bundle in  place -Daily SAT and SBT -PAD protocol in place -wean sedation for RASS goal 0 to -1  Acute encephalopathy: post arrest; per ED w/ possible purposeful mvt's -UDS positive for THC; ethanol wnl -CT head negative for acute abnormality Plan: -limit sedating meds -wean sedation for RASS 0 to -1 -consider EEG if no improvement in mental status -Check acetaminophen/salicylate level  AKI Plan: -will give some IV fluids -Trend BMP / urinary output -Replace electrolytes as  indicated -Avoid nephrotoxic agents, ensure adequate renal perfusion  Mild hyperglycemia Plan: -Check A1c -Hold on SSI for now; CBG monitoring   Best Practice (right click and "Reselect all SmartList Selections" daily)   Diet/type: NPO w/ meds via tube DVT prophylaxis: prophylactic heparin  GI prophylaxis: H2B Lines: N/A Foley:  Yes, and it is still needed Code Status:  full code Last date of multidisciplinary goals of care discussion [2/19 attempted to call mother x 2 with no answer]  Labs   CBC: Recent Labs  Lab 07/20/22 2234 07/20/22 2315  WBC 10.3  --   NEUTROABS 6.7  --   HGB 12.4  13.6 12.9  HCT 37.4  40.0 38.0  MCV 93.0  --   PLT 217  --     Basic Metabolic Panel: Recent Labs  Lab 07/20/22 2234 07/20/22 2315  NA 137  142 141  K 3.4*  3.4* 3.5  CL 108  --   CO2 18*  --   GLUCOSE 185*  --   BUN 11  --   CREATININE 1.28*  --   CALCIUM 8.3*  --    GFR: CrCl cannot be calculated (Unknown ideal weight.). Recent Labs  Lab 07/20/22 2234  WBC 10.3    Liver Function Tests: No results for input(s): "AST", "ALT", "ALKPHOS", "BILITOT", "PROT", "ALBUMIN" in the last 168 hours. No results for input(s): "LIPASE", "AMYLASE" in the last 168 hours. No results for input(s): "AMMONIA" in the last 168 hours.  ABG    Component Value Date/Time   PHART 7.304 (L) 07/20/2022 2315   PCO2ART 33.2 07/20/2022 2315   PO2ART 353 (H) 07/20/2022 2315   HCO3 16.8 (L) 07/20/2022 2315   TCO2 18 (L) 07/20/2022 2315   ACIDBASEDEF 9.0 (H) 07/20/2022 2315   O2SAT 100 07/20/2022 2315     Coagulation Profile: No results for input(s): "INR", "PROTIME" in the last 168 hours.  Cardiac Enzymes: No results for input(s): "CKTOTAL", "CKMB", "CKMBINDEX", "TROPONINI" in the last 168 hours.  HbA1C: No results found for: "HGBA1C"  CBG: No results for input(s): "GLUCAP" in the last 168 hours.  Review of Systems:   Patient is encephalopathic and/or intubated. Therefore history  has been obtained from chart review.    Past Medical History:  She,  has a past medical history of Medical history non-contributory and Menorrhagia.   Surgical History:   Past Surgical History:  Procedure Laterality Date   NO PAST SURGERIES       Social History:   reports that she has never smoked. She has never used smokeless tobacco. She reports that she does not drink alcohol and does not use drugs.   Family History:  Her family history includes Polymyositis in her mother.   Allergies No Known Allergies   Home Medications  Prior to Admission medications   Medication Sig Start Date End Date Taking? Authorizing Provider  ibuprofen (ADVIL) 800 MG tablet Take 1 tablet (800 mg total) by mouth every 8 (eight) hours as needed (pain). 08/30/21   Barrett Henle, MD  neomycin-polymyxin-hydrocortisone (CORTISPORIN) OTIC solution Place 3 drops into both ears 4 (four) times daily. 08/30/21   Barrett Henle, MD  norgestimate-ethinyl estradiol (ORTHO-CYCLEN) 0.25-35 MG-MCG tablet Take 1 tablet by mouth daily. 10/25/20   Donnamae Jude, MD  Prenatal Vit-Fe Fumarate-FA (PRENATAL MULTIVITAMIN) TABS tablet Take 1 tablet by mouth daily at 12 noon. Patient not taking: Reported on 11/29/2020    [provider]     Critical care time: 45 minutes    JD Rexene Agent [facility] 07/20/2022, 11:35 PM  Please see [email or web address] for pager details.  From 7A-7P if no response, please call [phone]. After hours, please call ELink [phone].

## 2022-07-20 NOTE — ED Notes (Signed)
Pt's mother at bedside.

## 2022-07-20 NOTE — ED Triage Notes (Addendum)
Patient arrived with EMS from home post CPR : found unresponsive at home this evening , received  2 shocks for  VFib ; cardioverted x1 for Vtach  Amiodarone 300 mg IV ; 3 Epi IV ;: Fentanyl 500 mcg ; Narcan 1 mg and Epi drip prior to arrival . ROSC prior  arrival . CPR approx. 11 mins . Intubated at scene , intubated by EDP at arrival.

## 2022-07-20 NOTE — ED Provider Notes (Addendum)
[facility] Provider Note   CSN: [id number] Arrival date & time: 07/20/22  2205     History  Chief Complaint  Patient presents with   POST CPR    Alexandra Freeman is a 27 y.o. female here s/p cardiac arrest.  Per EMS report, the patient had been at home with the family or friends, and reportedly had been "smoking marijuana".  And during this episode the patient became unresponsive in the house.  EMS was called to the scene as this was a witnessed event.  They report approximate downtime perhaps 6 to 7 minutes.  No CPR initiated per EMS report.  On their arrival the patient was in V-fib arrest.  Patient had a total of 11 minutes of CPR performed.  The patient was defibrillated and subsequently cardioverted twice out of ventricular tachycardia into finally a sinus rhythm.  The patient received 500 cc of fluid by EMS, 3 rounds of epi, 300 mg of amiodarone, 50 mcg of fentanyl.  An oral airway was placed.  EMS reports the final was given because the patient was "biting on her tube".  HPI     Home Medications Prior to Admission medications   Medication Sig Start Date End Date Taking? Authorizing Provider  ibuprofen (ADVIL) 800 MG tablet Take 1 tablet (800 mg total) by mouth every 8 (eight) hours as needed (pain). 08/30/21   Barrett Henle, MD  neomycin-polymyxin-hydrocortisone (CORTISPORIN) OTIC solution Place 3 drops into both ears 4 (four) times daily. 08/30/21   Barrett Henle, MD  norgestimate-ethinyl estradiol (ORTHO-CYCLEN) 0.25-35 MG-MCG tablet Take 1 tablet by mouth daily. 10/25/20   Donnamae Jude, MD  Prenatal Vit-Fe Fumarate-FA (PRENATAL MULTIVITAMIN) TABS tablet Take 1 tablet by mouth daily at 12 noon. Patient not taking: Reported on 11/29/2020    [provider]      Allergies    Patient has no known allergies.    Review of Systems   Review of Systems  Physical Exam Updated Vital Signs BP (!) 134/99   Pulse  (!) 158   Temp (!) 95.4 F (35.2 C)   Resp 18   Ht '5\' 5"'$  (1.651 m)   SpO2 100%   BMI 29.89 kg/m  Physical Exam Constitutional:      Comments: Obtunded  Eyes:     Pupils: Pupils are equal, round, and reactive to light.  Cardiovascular:     Rate and Rhythm: Normal rate and regular rhythm.     Pulses: Normal pulses.  Pulmonary:     Comments: Bag mask ventilation Bilateral breath sounds Musculoskeletal:        General: No swelling or deformity.  Neurological:     Comments: GCS eyes 1, verbal 1, motor 5     ED Results / Procedures / Treatments   Labs (all labs ordered are listed, but only abnormal results are displayed) Labs Reviewed  BASIC METABOLIC PANEL - Abnormal; Notable for the following components:      Result Value   Potassium 3.4 (*)    CO2 18 (*)    Glucose, Bld 185 (*)    Creatinine, Ser 1.28 (*)    Calcium 8.3 (*)    GFR, Estimated 59 (*)    All other components within normal limits  CBC WITH DIFFERENTIAL/PLATELET - Abnormal; Notable for the following components:   Abs Immature Granulocytes 0.09 (*)    All other components within normal limits  RAPID URINE DRUG SCREEN, HOSP PERFORMED -  Abnormal; Notable for the following components:   Tetrahydrocannabinol POSITIVE (*)    All other components within normal limits  URINALYSIS, ROUTINE W REFLEX MICROSCOPIC - Abnormal; Notable for the following components:   APPearance HAZY (*)    Specific Gravity, Urine >1.030 (*)    Glucose, UA 100 (*)    Hgb urine dipstick MODERATE (*)    Protein, ur 100 (*)    All other components within normal limits  URINALYSIS, MICROSCOPIC (REFLEX) - Abnormal; Notable for the following components:   Bacteria, UA RARE (*)    All other components within normal limits  I-STAT VENOUS BLOOD GAS, ED - Abnormal; Notable for the following components:   pH, Ven 7.248 (*)    pCO2, Ven 41.5 (*)    pO2, Ven 191 (*)    Bicarbonate 18.1 (*)    TCO2 19 (*)    Acid-base deficit 9.0 (*)     Potassium 3.4 (*)    Calcium, Ion 1.14 (*)    All other components within normal limits  I-STAT ARTERIAL BLOOD GAS, ED - Abnormal; Notable for the following components:   pH, Arterial 7.304 (*)    pO2, Arterial 353 (*)    Bicarbonate 16.8 (*)    TCO2 18 (*)    Acid-base deficit 9.0 (*)    Calcium, Ion 1.13 (*)    All other components within normal limits  TROPONIN I (HIGH SENSITIVITY) - Abnormal; Notable for the following components:   Troponin I (High Sensitivity) 107 (*)    All other components within normal limits  ETHANOL  BLOOD GAS, ARTERIAL  MAGNESIUM  I-STAT BETA HCG BLOOD, ED ([facility], [facility], [facility] ONLY)    EKG EKG Interpretation  Date/Time:  Sunday July 20 2022 22:31:51 EST Ventricular Rate:  97 PR Interval:  150 QRS Duration: 101 QT Interval:  389 QTC Calculation: 495 R Axis:   208 Text Interpretation: Sinus rhythm Biatrial enlargement Right axis deviation Borderline prolonged QT interval Confirmed by Octaviano Glow [phone]) on 07/20/2022 10:32:56 PM  Radiology DG Chest Portable 1 View  Result Date: 07/20/2022 CLINICAL DATA:  Post intubation. EXAM: PORTABLE CHEST 1 VIEW COMPARISON:  None Available. FINDINGS: The heart is normal in size. Lungs are clear without evidence of focal consolidation or pleural effusion no acute osseous abnormality. Endotracheal tube with distal tip approximately 4.5 cm above the carina. Right PICC with distal tip likely in the IVC. Feeding tube coursing below the diaphragm with distal tip not included. IMPRESSION: 1. Endotracheal tube with distal tip approximately 4.5 cm above the carina. 2. Right PICC with distal tip likely in the IVC. Electronically Signed   By: Keane Police D.O.   On: 07/20/2022 23:07    Procedures .Critical Care  Performed by: Wyvonnia Dusky, MD Authorized by: Wyvonnia Dusky, MD   Critical care provider statement:    Critical care time (minutes):  45   Critical care time was exclusive of:  Separately billable  procedures and treating other patients   Critical care was necessary to treat or prevent imminent or life-threatening deterioration of the following conditions:  Respiratory failure   Critical care was time spent personally by me on the following activities:  Ordering and performing treatments and interventions, ordering and review of laboratory studies, ordering and review of radiographic studies, pulse oximetry, review of old charts, examination of patient and evaluation of patient's response to treatment     Medications Ordered in ED Medications  fentaNYL 2541mg in NS 2529m(1036mml) infusion-PREMIX (100  mcg/hr Intravenous Rate/Dose Change 07/20/22 2335)  etomidate (AMIDATE) injection 30 mg (30 mg Intravenous Given 07/20/22 2216)  rocuronium bromide 10 mg/mL (PF) syringe (70 mg Intravenous Given 07/20/22 2218)  sodium chloride 0.9 % bolus 1,000 mL (0 mLs Intravenous Stopped 07/20/22 2309)    ED Course/ Medical Decision Making/ A&P Clinical Course as of 07/20/22 2348  Sun Jul 20, 2022  2254 Admitted to [facility] [MT]    Clinical Course User Index [MT] Langston Masker Carola Rhine, MD                             Medical Decision Making Amount and/or Complexity of Data Reviewed Labs: ordered. Radiology: ordered. ECG/medicine tests: ordered.  Risk Prescription drug management. Decision regarding hospitalization.   This patient presents to the ED with concern for cardiac arrest.   Unclear etiology of the arrest at this time.  This may have been due to an arrhythmia versus pulmonary embolism versus overdose.  Patient was on an epinephrine infusion per EMS but this was discontinued on arrival and she is not requiring vasopressors.  This presentation is not consistent with sepsis.  She has some mild hypothermia which is likely environmental as she was transported in thinly clothed.  Patient's initial rhythm was V-fib and subsequently V. tach in the field per EMS.  Additional history  obtained from EMS  I ordered and personally interpreted labs.  The pertinent results include: Minor troponin elevation which is consistent with a cardiac arrest, defibrillation and CPR.  Low suspicion for STEMI.  Initial blood gas had some acidosis but improved on arterial gas after intubation on ventilator.  UDS positive for THC.  I ordered imaging studies including x-ray of the chest, CT of the head and PE study. I independently visualized and interpreted imaging which showed x-ray of the chest showing appropriate ET tube location.  CT of the head and pulmonary embolism study was pending at the time of admission to the intensive care unit. I agree with the radiologist interpretation  The patient was maintained on a cardiac monitor.  I personally viewed and interpreted the cardiac monitored which showed an underlying rhythm of: Sinus rhythm  Per my interpretation the patient's ECG shows sinus rhythm no acute ischemic findings  I ordered medication including fentanyl infusion for sedation, IV fluid bolus I have reviewed the patients home medicines and have made adjustments as needed  I requested consultation with the critical care,  and discussed lab and imaging findings as well as pertinent plan - they recommend: ICU admission  After the interventions noted above, I reevaluated the patient and found that they have: stayed the same   Dispostion:  After consideration of the diagnostic results and the patients response to treatment, I feel that the patent would benefit from ICU admission         Final Clinical Impression(s) / ED Diagnoses Final diagnoses:  Cardiac arrest [facility])    Rx / DC Orders ED Discharge Orders     None         Shantia Sanford, Carola Rhine, MD 07/20/22 2349    Wyvonnia Dusky, MD 08/01/22 [phone]

## 2022-07-20 NOTE — ED Notes (Signed)
EDP notified on elevated Troponin result.

## 2022-07-20 NOTE — H&P (Incomplete)
NAME:  Alexandra Freeman, MRN:  [id number], DOB:  [birth year], LOS: 0 ADMISSION DATE:  07/20/2022, CONSULTATION DATE:  2/18 REFERRING MD:  Dr. Langston Masker, CHIEF COMPLAINT:  cardiac arrest   History of Present Illness:  Patient is a 27 year old female pertinent PMH menorrhagia on birth control presents to [facility] ED on 2/18 cardiac arrest.  Per EMS, patient was at home with family or friends reportedly smoking marijuana.  Patient became unresponsive and EMS called.  Down for approximately 6 to 7 minutes with no CPR prior to EMS arrival.  Patient found to be in V-fib arrest, defibrillated, and received 11 minutes of CPR until ROSC.  Patient received epi and amio.  Patient transported to [facility].  In route to [facility], patient biting at igel supraglottic airway and was given 50 mics of fentanyl.  Upon arrival to [facility] ED, patient vitals stable.  Temp 95.1 F.  Airway exchanged for ETT. CXR showing ETT in good position. CTA chest/head pending. EKG qtc 495, no ST elevations. ABG 7.24, 41, 191, 18.  Potassium 3.4, glucose 185, creatinine 1.28, calcium 8.3.  Troponin 107.  Ethanol WNL.  UDS positive for THC.  UA unremarkable.  PCCM consulted for ICU admission.  Pertinent  Medical History   Past Medical History:  Diagnosis Date  . Medical history non-contributory   . Menorrhagia      Significant Hospital Events: Including procedures, antibiotic start and stop dates in addition to other pertinent events   2/18 admitted post cardiac arrest  Interim History / Subjective:  See above  Objective   Blood pressure (!) 134/99, pulse (!) 158, temperature (!) 95.4 F (35.2 C), resp. rate 18, height 5' 5"$  (1.651 m), SpO2 100 %, not currently breastfeeding.    Vent Mode: PRVC FiO2 (%):  [100 %] 100 % Set Rate:  [18 bmp] 18 bmp Vt Set:  [500 mL] 500 mL PEEP:  [5 cmH20] 5 cmH20   Intake/Output Summary (Last 24 hours) at 07/20/2022 2335 Last data filed at 07/20/2022 2309 Gross per 24 hour  Intake 1000 ml  Output --   Net 1000 ml   There were no vitals filed for this visit.  Examination: General:   critically ill appearing young female on mech vent HEENT: MM pink/moist; ETT in place Neuro: sedate (recently received RSI meds); PERRL CV: s1s2, RRR, no m/r/g PULM:  dim clear BS bilaterally; on mech vent PRVC GI: soft, bsx4 active  Extremities: warm/dry, no edema  Skin: no rashes or lesions    Resolved Hospital Problem list     Assessment & Plan:  Cardiac arrest Hypokalemia/hypocalcemia -UDS positive for THC; ethanol WNL Plan: -will admit to ICU w/ continuous telemetry monitoring -trend troponin and lactate -Replete K, calcium, mag; add on mag to previous collection; trend and replete electrolytes as needed -echo -CT head and CTA chest pending -send salicylate, acetaminophen level -Consider EEG if no improvement in mental status -TTM normothermia protocol in place  Acute respiratory failure due to above Plan: -LTVV strategy with tidal volumes of 6-8 cc/kg ideal body weight -Wean PEEP/FiO2 for SpO2 >92% -VAP bundle in place -Daily SAT and SBT -PAD protocol in place -wean sedation for RASS goal 0 to -1  AKI Plan: -will give some IV fluids -Trend BMP / urinary output -Replace electrolytes as indicated -Avoid nephrotoxic agents, ensure adequate renal perfusion   Mild hyperglycemia Plan: -Check A1c -Hold on SSI for now; CBG monitoring   Best Practice (right click and "Reselect all SmartList Selections" daily)  Diet/type: NPO w/ meds via tube DVT prophylaxis: {anticoagulation (Optional):25687} GI prophylaxis: H2B Lines: N/A Foley:  Yes, and it is still needed Code Status:  full code Last date of multidisciplinary goals of care discussion [***]  Labs   CBC: Recent Labs  Lab 07/20/22 2234 07/20/22 2315  WBC 10.3  --   NEUTROABS 6.7  --   HGB 12.4  13.6 12.9  HCT 37.4  40.0 38.0  MCV 93.0  --   PLT 217  --     Basic Metabolic Panel: Recent Labs  Lab  07/20/22 2234 07/20/22 2315  NA 137  142 141  K 3.4*  3.4* 3.5  CL 108  --   CO2 18*  --   GLUCOSE 185*  --   BUN 11  --   CREATININE 1.28*  --   CALCIUM 8.3*  --    GFR: CrCl cannot be calculated (Unknown ideal weight.). Recent Labs  Lab 07/20/22 2234  WBC 10.3    Liver Function Tests: No results for input(s): "AST", "ALT", "ALKPHOS", "BILITOT", "PROT", "ALBUMIN" in the last 168 hours. No results for input(s): "LIPASE", "AMYLASE" in the last 168 hours. No results for input(s): "AMMONIA" in the last 168 hours.  ABG    Component Value Date/Time   PHART 7.304 (L) 07/20/2022 2315   PCO2ART 33.2 07/20/2022 2315   PO2ART 353 (H) 07/20/2022 2315   HCO3 16.8 (L) 07/20/2022 2315   TCO2 18 (L) 07/20/2022 2315   ACIDBASEDEF 9.0 (H) 07/20/2022 2315   O2SAT 100 07/20/2022 2315     Coagulation Profile: No results for input(s): "INR", "PROTIME" in the last 168 hours.  Cardiac Enzymes: No results for input(s): "CKTOTAL", "CKMB", "CKMBINDEX", "TROPONINI" in the last 168 hours.  HbA1C: No results found for: "HGBA1C"  CBG: No results for input(s): "GLUCAP" in the last 168 hours.  Review of Systems:   Patient is encephalopathic and/or intubated. Therefore history has been obtained from chart review.    Past Medical History:  She,  has a past medical history of Medical history non-contributory and Menorrhagia.   Surgical History:   Past Surgical History:  Procedure Laterality Date  . NO PAST SURGERIES       Social History:   reports that she has never smoked. She has never used smokeless tobacco. She reports that she does not drink alcohol and does not use drugs.   Family History:  Her family history includes Polymyositis in her mother.   Allergies No Known Allergies   Home Medications  Prior to Admission medications   Medication Sig Start Date End Date Taking? Authorizing Provider  ibuprofen (ADVIL) 800 MG tablet Take 1 tablet (800 mg total) by mouth every 8  (eight) hours as needed (pain). 08/30/21   Barrett Henle, MD  neomycin-polymyxin-hydrocortisone (CORTISPORIN) OTIC solution Place 3 drops into both ears 4 (four) times daily. 08/30/21   Barrett Henle, MD  norgestimate-ethinyl estradiol (ORTHO-CYCLEN) 0.25-35 MG-MCG tablet Take 1 tablet by mouth daily. 10/25/20   Donnamae Jude, MD  Prenatal Vit-Fe Fumarate-FA (PRENATAL MULTIVITAMIN) TABS tablet Take 1 tablet by mouth daily at 12 noon. Patient not taking: Reported on 11/29/2020    [provider]     Critical care time: 45 minutes     JD Rexene Agent [facility] 07/20/2022, 11:35 PM  Please see [email or web address] for pager details.  From 7A-7P if no response, please call [phone]. After hours, please call ELink [phone].

## 2022-07-20 NOTE — ED Provider Notes (Signed)
Procedure Name: Intubation Date/Time: 07/20/2022 10:26 PM  Performed by: Tedd Sias, PAPre-anesthesia Checklist: Patient identified, Patient being monitored, Emergency Drugs available, Timeout performed and Suction available Oxygen Delivery Method: Ambu bag Preoxygenation: Pre-oxygenation with 100% oxygen Induction Type: Rapid sequence Ventilation: Mask ventilation without difficulty Laryngoscope Size: Glidescope and 3 Tube size: 7.5 mm Number of attempts: 1 Placement Confirmation: ETT inserted through vocal cords under direct vision, CO2 detector and Breath sounds checked- equal and bilateral Secured at: 23 cm Tube secured with: ETT holder Dental Injury: Teeth and Oropharynx as per pre-operative assessment  Comments: Patient with i-gel supraglottic airway placed by EMS.  This was removed and patient was completed successfully with 1 attempt.        Tedd Sias, [place] 07/20/22 2227    Wyvonnia Dusky, MD 07/20/22 2351

## 2022-07-21 ENCOUNTER — Inpatient Hospital Stay (HOSPITAL_COMMUNITY): Payer: Medicaid Other

## 2022-07-21 ENCOUNTER — Encounter (HOSPITAL_COMMUNITY): Payer: Self-pay | Admitting: Critical Care Medicine

## 2022-07-21 ENCOUNTER — Other Ambulatory Visit: Payer: Self-pay

## 2022-07-21 DIAGNOSIS — G9340 Encephalopathy, unspecified: Secondary | ICD-10-CM

## 2022-07-21 DIAGNOSIS — R4182 Altered mental status, unspecified: Secondary | ICD-10-CM | POA: Diagnosis not present

## 2022-07-21 DIAGNOSIS — K7200 Acute and subacute hepatic failure without coma: Secondary | ICD-10-CM | POA: Diagnosis present

## 2022-07-21 DIAGNOSIS — Z781 Physical restraint status: Secondary | ICD-10-CM | POA: Diagnosis not present

## 2022-07-21 DIAGNOSIS — I469 Cardiac arrest, cause unspecified: Secondary | ICD-10-CM | POA: Diagnosis not present

## 2022-07-21 DIAGNOSIS — G4700 Insomnia, unspecified: Secondary | ICD-10-CM | POA: Diagnosis not present

## 2022-07-21 DIAGNOSIS — X31XXXA Exposure to excessive natural cold, initial encounter: Secondary | ICD-10-CM | POA: Diagnosis not present

## 2022-07-21 DIAGNOSIS — E876 Hypokalemia: Secondary | ICD-10-CM | POA: Diagnosis not present

## 2022-07-21 DIAGNOSIS — Z9911 Dependence on respirator [ventilator] status: Secondary | ICD-10-CM | POA: Diagnosis not present

## 2022-07-21 DIAGNOSIS — E872 Acidosis, unspecified: Secondary | ICD-10-CM | POA: Diagnosis present

## 2022-07-21 DIAGNOSIS — I4901 Ventricular fibrillation: Secondary | ICD-10-CM | POA: Diagnosis present

## 2022-07-21 DIAGNOSIS — R739 Hyperglycemia, unspecified: Secondary | ICD-10-CM | POA: Diagnosis present

## 2022-07-21 DIAGNOSIS — J690 Pneumonitis due to inhalation of food and vomit: Secondary | ICD-10-CM | POA: Diagnosis not present

## 2022-07-21 DIAGNOSIS — J9601 Acute respiratory failure with hypoxia: Secondary | ICD-10-CM | POA: Diagnosis present

## 2022-07-21 DIAGNOSIS — G9341 Metabolic encephalopathy: Secondary | ICD-10-CM | POA: Diagnosis present

## 2022-07-21 DIAGNOSIS — N179 Acute kidney failure, unspecified: Secondary | ICD-10-CM | POA: Diagnosis not present

## 2022-07-21 DIAGNOSIS — Z1152 Encounter for screening for COVID-19: Secondary | ICD-10-CM | POA: Diagnosis not present

## 2022-07-21 DIAGNOSIS — T68XXXA Hypothermia, initial encounter: Secondary | ICD-10-CM | POA: Diagnosis present

## 2022-07-21 DIAGNOSIS — I3139 Other pericardial effusion (noninflammatory): Secondary | ICD-10-CM | POA: Diagnosis not present

## 2022-07-21 DIAGNOSIS — J9600 Acute respiratory failure, unspecified whether with hypoxia or hypercapnia: Secondary | ICD-10-CM

## 2022-07-21 DIAGNOSIS — I10 Essential (primary) hypertension: Secondary | ICD-10-CM | POA: Diagnosis not present

## 2022-07-21 DIAGNOSIS — D696 Thrombocytopenia, unspecified: Secondary | ICD-10-CM | POA: Diagnosis not present

## 2022-07-21 DIAGNOSIS — Z793 Long term (current) use of hormonal contraceptives: Secondary | ICD-10-CM | POA: Diagnosis not present

## 2022-07-21 DIAGNOSIS — F05 Delirium due to known physiological condition: Secondary | ICD-10-CM | POA: Diagnosis not present

## 2022-07-21 DIAGNOSIS — D649 Anemia, unspecified: Secondary | ICD-10-CM | POA: Diagnosis not present

## 2022-07-21 LAB — ECHOCARDIOGRAM COMPLETE
Area-P 1/2: 2.8 cm2
Height: 65 in
S' Lateral: 2.4 cm
Single Plane A4C EF: 46.8 %
Weight: 2437.41 oz

## 2022-07-21 LAB — MAGNESIUM
Magnesium: 2 mg/dL (ref 1.7–2.4)
Magnesium: 2.1 mg/dL (ref 1.7–2.4)
Magnesium: 2.1 mg/dL (ref 1.7–2.4)
Magnesium: 2.3 mg/dL (ref 1.7–2.4)

## 2022-07-21 LAB — COMPREHENSIVE METABOLIC PANEL
ALT: 88 U/L — ABNORMAL HIGH (ref 0–44)
AST: 145 U/L — ABNORMAL HIGH (ref 15–41)
Albumin: 3.8 g/dL (ref 3.5–5.0)
Alkaline Phosphatase: 88 U/L (ref 38–126)
Anion gap: 9 (ref 5–15)
BUN: 9 mg/dL (ref 6–20)
CO2: 18 mmol/L — ABNORMAL LOW (ref 22–32)
Calcium: 7.6 mg/dL — ABNORMAL LOW (ref 8.9–10.3)
Chloride: 110 mmol/L (ref 98–111)
Creatinine, Ser: 1.22 mg/dL — ABNORMAL HIGH (ref 0.44–1.00)
GFR, Estimated: >60 mL/min (ref >60)
Glucose, Bld: 171 mg/dL — ABNORMAL HIGH (ref 70–99)
Potassium: 3.4 mmol/L — ABNORMAL LOW (ref 3.5–5.1)
Sodium: 137 mmol/L (ref 135–145)
Total Bilirubin: 0.8 mg/dL (ref 0.3–1.2)
Total Protein: 6.8 g/dL (ref 6.5–8.1)

## 2022-07-21 LAB — CBC
HCT: 39.8 % (ref 36.0–46.0)
Hemoglobin: 12.4 g/dL (ref 12.0–15.0)
MCH: 29.7 pg (ref 26.0–34.0)
MCHC: 31.2 g/dL (ref 30.0–36.0)
MCV: 95.2 fL (ref 80.0–100.0)
Platelets: 257 10*3/uL (ref 150–400)
RBC: 4.18 MIL/uL (ref 3.87–5.11)
RDW: 14.6 % (ref 11.5–15.5)
WBC: 21.6 10*3/uL — ABNORMAL HIGH (ref 4.0–10.5)
nRBC: 0 % (ref 0.0–0.2)

## 2022-07-21 LAB — BASIC METABOLIC PANEL
Anion gap: 5 (ref 5–15)
BUN: 9 mg/dL (ref 6–20)
CO2: 19 mmol/L — ABNORMAL LOW (ref 22–32)
Calcium: 8 mg/dL — ABNORMAL LOW (ref 8.9–10.3)
Chloride: 111 mmol/L (ref 98–111)
Creatinine, Ser: 0.79 mg/dL (ref 0.44–1.00)
GFR, Estimated: >60 mL/min (ref >60)
Glucose, Bld: 111 mg/dL — ABNORMAL HIGH (ref 70–99)
Potassium: 4.3 mmol/L (ref 3.5–5.1)
Sodium: 135 mmol/L (ref 135–145)

## 2022-07-21 LAB — GLUCOSE, CAPILLARY
Glucose-Capillary: 135 mg/dL — ABNORMAL HIGH (ref 70–99)
Glucose-Capillary: 105 mg/dL — ABNORMAL HIGH (ref 70–99)
Glucose-Capillary: 85 mg/dL (ref 70–99)
Glucose-Capillary: 102 mg/dL — ABNORMAL HIGH (ref 70–99)
Glucose-Capillary: 106 mg/dL — ABNORMAL HIGH (ref 70–99)
Glucose-Capillary: 115 mg/dL — ABNORMAL HIGH (ref 70–99)

## 2022-07-21 LAB — HEPATIC FUNCTION PANEL
ALT: 92 U/L — ABNORMAL HIGH (ref 0–44)
AST: 141 U/L — ABNORMAL HIGH (ref 15–41)
Albumin: 3.7 g/dL (ref 3.5–5.0)
Alkaline Phosphatase: 88 U/L (ref 38–126)
Bilirubin, Direct: 0.2 mg/dL (ref 0.0–0.2)
Indirect Bilirubin: 0.2 mg/dL — ABNORMAL LOW (ref 0.3–0.9)
Total Bilirubin: 0.4 mg/dL (ref 0.3–1.2)
Total Protein: 7 g/dL (ref 6.5–8.1)

## 2022-07-21 LAB — HEMOGLOBIN A1C
Hgb A1c MFr Bld: 4.6 % — ABNORMAL LOW (ref 4.8–5.6)
Mean Plasma Glucose: 85.32 mg/dL

## 2022-07-21 LAB — ACETAMINOPHEN LEVEL: Acetaminophen (Tylenol), Serum: <10 ug/mL — ABNORMAL LOW (ref 10–30)

## 2022-07-21 LAB — HIV ANTIBODY (ROUTINE TESTING W REFLEX): HIV Screen 4th Generation wRfx: NONREACTIVE

## 2022-07-21 LAB — PHOSPHORUS: Phosphorus: 2.8 mg/dL (ref 2.5–4.6)

## 2022-07-21 LAB — LACTIC ACID, PLASMA
Lactic Acid, Venous: 3.2 mmol/L (ref 0.5–1.9)
Lactic Acid, Venous: 2.5 mmol/L (ref 0.5–1.9)

## 2022-07-21 LAB — TROPONIN I (HIGH SENSITIVITY): Troponin I (High Sensitivity): 316 ng/L (ref <18)

## 2022-07-21 LAB — SALICYLATE LEVEL: Salicylate Lvl: <7 mg/dL — ABNORMAL LOW (ref 7.0–30.0)

## 2022-07-21 LAB — RESP PANEL BY RT-PCR (RSV, FLU A&B, COVID)  RVPGX2
Influenza A by PCR: NEGATIVE
Influenza B by PCR: NEGATIVE
Resp Syncytial Virus by PCR: NEGATIVE
SARS Coronavirus 2 by RT PCR: NEGATIVE

## 2022-07-21 LAB — MRSA NEXT GEN BY PCR, NASAL: MRSA by PCR Next Gen: NOT DETECTED

## 2022-07-21 MED ORDER — ORAL CARE MOUTH RINSE
15.0000 mL | OROMUCOSAL | Status: DC
  Administered 2022-07-21 – 2022-07-22 (×17): 15 mL via OROMUCOSAL

## 2022-07-21 MED ORDER — FENTANYL CITRATE PF 50 MCG/ML IJ SOSY: 50.0000 ug | PREFILLED_SYRINGE | INTRAMUSCULAR | Status: DC | PRN

## 2022-07-21 MED ORDER — MIDAZOLAM HCL 2 MG/2ML IJ SOLN: 2.0000 mg | Freq: Once | INTRAMUSCULAR | Status: AC

## 2022-07-21 MED ORDER — DEXMEDETOMIDINE HCL IN NACL 400 MCG/100ML IV SOLN
0.0000 ug/kg/h | INTRAVENOUS | Status: DC
  Administered 2022-07-21: 0.4 ug/kg/h via INTRAVENOUS
  Administered 2022-07-21: 0.8 ug/kg/h via INTRAVENOUS
  Filled 2022-07-21 (×3): qty 100

## 2022-07-21 MED ORDER — HEPARIN SODIUM (PORCINE) 5000 UNIT/ML IJ SOLN
5000.0000 [IU] | Freq: Three times a day (TID) | INTRAMUSCULAR | Status: DC
  Administered 2022-07-21 – 2022-07-28 (×22): 5000 [IU] via SUBCUTANEOUS
  Filled 2022-07-21 (×21): qty 1

## 2022-07-21 MED ORDER — POLYETHYLENE GLYCOL 3350 17 G PO PACK: 17.0000 g | PACK | Freq: Every day | ORAL | Status: DC

## 2022-07-21 MED ORDER — FENTANYL 2500MCG IN NS 250ML (10MCG/ML) PREMIX INFUSION
50.0000 ug/h | INTRAVENOUS | Status: DC
  Administered 2022-07-21: 150 ug/h via INTRAVENOUS
  Administered 2022-07-22: 200 ug/h via INTRAVENOUS
  Filled 2022-07-21 (×3): qty 250

## 2022-07-21 MED ORDER — DOCUSATE SODIUM 100 MG PO CAPS: 100.0000 mg | ORAL_CAPSULE | Freq: Two times a day (BID) | ORAL | Status: DC | PRN

## 2022-07-21 MED ORDER — CHLORHEXIDINE GLUCONATE CLOTH 2 % EX PADS
6.0000 | MEDICATED_PAD | Freq: Every day | CUTANEOUS | Status: DC
  Administered 2022-07-21 – 2022-07-23 (×3): 6 via TOPICAL

## 2022-07-21 MED ORDER — ORAL CARE MOUTH RINSE: 15.0000 mL | OROMUCOSAL | Status: DC | PRN

## 2022-07-21 MED ORDER — POLYETHYLENE GLYCOL 3350 17 G PO PACK: 17.0000 g | PACK | Freq: Every day | ORAL | Status: DC | PRN

## 2022-07-21 MED ORDER — FAMOTIDINE 20 MG PO TABS
20.0000 mg | ORAL_TABLET | Freq: Two times a day (BID) | ORAL | Status: DC
  Administered 2022-07-21 (×3): 20 mg
  Filled 2022-07-21 (×4): qty 1

## 2022-07-21 MED ORDER — MIDAZOLAM HCL 2 MG/2ML IJ SOLN
INTRAMUSCULAR | Status: AC
  Administered 2022-07-21: 2 mg via INTRAVENOUS
  Filled 2022-07-21: qty 2

## 2022-07-21 MED ORDER — PROSOURCE TF20 ENFIT COMPATIBL EN LIQD
60.0000 mL | Freq: Every day | ENTERAL | Status: DC
  Administered 2022-07-21: 60 mL
  Filled 2022-07-21 (×2): qty 60

## 2022-07-21 MED ORDER — FENTANYL BOLUS VIA INFUSION
50.0000 ug | INTRAVENOUS | Status: DC | PRN
  Administered 2022-07-21: 100 ug via INTRAVENOUS
  Administered 2022-07-21: 50 ug via INTRAVENOUS
  Administered 2022-07-21 (×2): 100 ug via INTRAVENOUS
  Administered 2022-07-21: 50 ug via INTRAVENOUS
  Administered 2022-07-22 (×5): 100 ug via INTRAVENOUS

## 2022-07-21 MED ORDER — BUSPIRONE HCL 10 MG PO TABS: 30.0000 mg | ORAL_TABLET | Freq: Three times a day (TID) | ORAL | Status: DC | PRN

## 2022-07-21 MED ORDER — ACETAMINOPHEN 325 MG PO TABS: 650.0000 mg | ORAL_TABLET | ORAL | Status: DC | PRN

## 2022-07-21 MED ORDER — SODIUM CHLORIDE 0.9 % IV SOLN
3.0000 g | Freq: Three times a day (TID) | INTRAVENOUS | Status: AC
  Administered 2022-07-21 – 2022-07-24 (×9): 3 g via INTRAVENOUS
  Filled 2022-07-21 (×9): qty 8

## 2022-07-21 MED ORDER — FENTANYL CITRATE PF 50 MCG/ML IJ SOSY
50.0000 ug | PREFILLED_SYRINGE | INTRAMUSCULAR | Status: DC | PRN
  Administered 2022-07-21: 150 ug via INTRAVENOUS

## 2022-07-21 MED ORDER — PROPOFOL 1000 MG/100ML IV EMUL
INTRAVENOUS | Status: AC
  Filled 2022-07-21: qty 100

## 2022-07-21 MED ORDER — MIDAZOLAM HCL 2 MG/2ML IJ SOLN
1.0000 mg | INTRAMUSCULAR | Status: DC | PRN
  Administered 2022-07-22 (×4): 2 mg via INTRAVENOUS
  Filled 2022-07-21 (×4): qty 2

## 2022-07-21 MED ORDER — VITAL HIGH PROTEIN PO LIQD
1000.0000 mL | ORAL | Status: DC
  Administered 2022-07-21: 1000 mL

## 2022-07-21 MED ORDER — DOCUSATE SODIUM 50 MG/5ML PO LIQD
100.0000 mg | Freq: Two times a day (BID) | ORAL | Status: DC
  Administered 2022-07-21 (×3): 100 mg
  Filled 2022-07-21 (×6): qty 10

## 2022-07-21 MED ORDER — DOCUSATE SODIUM 50 MG/5ML PO LIQD: 100.0000 mg | Freq: Two times a day (BID) | ORAL | Status: DC

## 2022-07-21 MED ORDER — PROPOFOL 1000 MG/100ML IV EMUL
0.0000 ug/kg/min | INTRAVENOUS | Status: DC
  Administered 2022-07-21: 5 ug/kg/min via INTRAVENOUS

## 2022-07-21 MED ORDER — PROPOFOL 1000 MG/100ML IV EMUL
INTRAVENOUS | Status: AC
  Administered 2022-07-21: 5 ug/kg/min via INTRAVENOUS
  Filled 2022-07-21: qty 100

## 2022-07-21 MED ORDER — POLYETHYLENE GLYCOL 3350 17 G PO PACK
17.0000 g | PACK | Freq: Every day | ORAL | Status: DC
  Administered 2022-07-21: 17 g
  Filled 2022-07-21 (×2): qty 1

## 2022-07-21 MED ORDER — PROPOFOL 1000 MG/100ML IV EMUL: 0.0000 ug/kg/min | INTRAVENOUS | Status: DC

## 2022-07-21 NOTE — Progress Notes (Signed)
  Echocardiogram 2D Echocardiogram has been performed.  Alexandra Freeman 07/21/2022, 9:45 AM

## 2022-07-21 NOTE — Progress Notes (Signed)
RT transported pt from TRA-B to CT3 and back with RN at bedside w/o complication.

## 2022-07-21 NOTE — Progress Notes (Signed)
Pt transported from TRA-B to [facility] with RN at bedside w/o complications.

## 2022-07-21 NOTE — ED Notes (Signed)
ED TO INPATIENT HANDOFF REPORT  ED Nurse Name and Phone #:  Clydene Laming [phone]  S Name/Age/Gender Alexandra Freeman 28 y.o. female Room/Bed: TRABC/TRABC  Code Status   Code Status: Full Code  Home/SNF/Other Home Patient oriented to: self Is this baseline? No   Triage Complete: Triage complete  Chief Complaint Cardiac arrest [facility]) [I46.9]  Triage Note Patient arrived with EMS from home post CPR : found unresponsive at home this evening , received  2 shocks for  VFib ; cardioverted x1 for Vtach  Amiodarone 300 mg IV ; 3 Epi IV ;: Fentanyl 500 mcg ; Narcan 1 mg and Epi drip prior to arrival . ROSC prior  arrival . CPR approx. 11 mins . Intubated at scene , intubated by EDP at arrival.   Allergies No Known Allergies  Level of Care/Admitting Diagnosis ED Disposition     ED Disposition  Admit   Condition  --   [place]: [place] [[id number]]  Level of Care: ICU [6]  May admit patient to [facility] or [facility] if equivalent level of care is available:: Yes  Covid Evaluation: Asymptomatic - no recent exposure (last 10 days) testing not required  Diagnosis: Cardiac arrest ([place]) [427.5.ICD-9-CM]  Admitting Physician: Audria Nine [id number]  Attending Physician: Audria Nine [id number]  Certification:: I certify this patient will need inpatient services for at least 2 midnights  Estimated Length of Stay: 2          B Medical/Surgery History Past Medical History:  Diagnosis Date   Medical history non-contributory    Menorrhagia    Past Surgical History:  Procedure Laterality Date   NO PAST SURGERIES       A IV Location/Drains/Wounds Patient Lines/Drains/Airways Status     Active Line/Drains/Airways     Name Placement date Placement time Site Days   Peripheral IV 07/20/22 18 G Left Antecubital 07/20/22  --  Antecubital  1   Peripheral IV 07/20/22 18 G Right Antecubital 07/20/22  2238  Antecubital  1   NG/OG  Vented/Dual Lumen 16 Fr. Oral 07/20/22  2242  Oral  1   Urethral Catheter Pablo Ledger. RN Non-latex;Temperature probe 07/20/22  2243  Non-latex;Temperature probe  1   Airway 7.5 mm 07/20/22  2228  -- 1   Intraosseous Line 07/20/22 Proximal tibia 07/20/22  --  --  1            Intake/Output Last 24 hours  Intake/Output Summary (Last 24 hours) at 07/21/2022 0022 Last data filed at 07/20/2022 2309 Gross per 24 hour  Intake 1000 ml  Output --  Net 1000 ml    Labs/Imaging Results for orders placed or performed during the hospital encounter of 07/20/22 (from the past 48 hour(s))  I-Stat Beta hCG blood, ED ([facility], [facility], [facility] only)     Status: None   Collection Time: 07/20/22 10:33 PM  Result Value Ref Range   I-stat hCG, quantitative <5.0 <5 mIU/mL   Comment 3            Comment:   GEST. AGE      CONC.  (mIU/mL)   <=1 WEEK        5 - 50     2 WEEKS       50 - 500     3 WEEKS       100 - 10,000     4 WEEKS     1,000 - 30,000  FEMALE AND NON-PREGNANT FEMALE:     LESS THAN 5 mIU/mL   I-Stat venous blood gas, ([facility] ED, [facility], [facility])     Status: Abnormal   Collection Time: 07/20/22 10:34 PM  Result Value Ref Range   pH, Ven 7.248 (L) 7.25 - 7.43   pCO2, Ven 41.5 (L) 44 - 60 mmHg   pO2, Ven 191 (H) 32 - 45 mmHg   Bicarbonate 18.1 (L) 20.0 - 28.0 mmol/L   TCO2 19 (L) 22 - 32 mmol/L   O2 Saturation 99 %   Acid-base deficit 9.0 (H) 0.0 - 2.0 mmol/L   Sodium 142 135 - 145 mmol/L   Potassium 3.4 (L) 3.5 - 5.1 mmol/L   Calcium, Ion 1.14 (L) 1.15 - 1.40 mmol/L   HCT 40.0 36.0 - 46.0 %   Hemoglobin 13.6 12.0 - 15.0 g/dL   Sample type VENOUS   Basic metabolic panel     Status: Abnormal   Collection Time: 07/20/22 10:34 PM  Result Value Ref Range   Sodium 137 135 - 145 mmol/L   Potassium 3.4 (L) 3.5 - 5.1 mmol/L   Chloride 108 98 - 111 mmol/L   CO2 18 (L) 22 - 32 mmol/L   Glucose, Bld 185 (H) 70 - 99 mg/dL    Comment: Glucose reference range applies only to samples taken after fasting for at  least 8 hours.   BUN 11 6 - 20 mg/dL   Creatinine, Ser 1.28 (H) 0.44 - 1.00 mg/dL   Calcium 8.3 (L) 8.9 - 10.3 mg/dL   GFR, Estimated 59 (L) >60 mL/min    Comment: (NOTE) Calculated using the CKD-EPI Creatinine Equation (2021)    Anion gap 11 5 - 15    Comment: Performed at [facility] [address] [place], [place] [address]  CBC with Differential     Status: Abnormal   Collection Time: 07/20/22 10:34 PM  Result Value Ref Range   WBC 10.3 4.0 - 10.5 K/uL   RBC 4.02 3.87 - 5.11 MIL/uL   Hemoglobin 12.4 12.0 - 15.0 g/dL   HCT 37.4 36.0 - 46.0 %   MCV 93.0 80.0 - 100.0 fL   MCH 30.8 26.0 - 34.0 pg   MCHC 33.2 30.0 - 36.0 g/dL   RDW 14.6 11.5 - 15.5 %   Platelets 217 150 - 400 K/uL   nRBC 0.0 0.0 - 0.2 %   Neutrophils Relative % 66 %   Neutro Abs 6.7 1.7 - 7.7 K/uL   Lymphocytes Relative 30 %   Lymphs Abs 3.1 0.7 - 4.0 K/uL   Monocytes Relative 3 %   Monocytes Absolute 0.3 0.1 - 1.0 K/uL   Eosinophils Relative 0 %   Eosinophils Absolute 0.0 0.0 - 0.5 K/uL   Basophils Relative 0 %   Basophils Absolute 0.0 0.0 - 0.1 K/uL   Immature Granulocytes 1 %   Abs Immature Granulocytes 0.09 (H) 0.00 - 0.07 K/uL    Comment: Performed at [facility] [address] [place],  [address]  Troponin I (High Sensitivity)     Status: Abnormal   Collection Time: 07/20/22 10:34 PM  Result Value Ref Range   Troponin I (High Sensitivity) 107 (HH) <18 ng/L    Comment: CRITICAL RESULT CALLED TO, READ BACK BY AND VERIFIED WITH BOBBY Katriona Schmierer RN 07/20/22 2322 M KOROLESKI (NOTE) Elevated high sensitivity troponin I (hsTnI) values and significant  changes across serial measurements may suggest ACS but many other  chronic  and acute conditions are known to elevate hsTnI results.  Refer to the "Links" section for chest pain algorithms and additional  guidance. Performed at [facility], [place] [address] [place], [place] [address]   Ethanol     Status: None   Collection  Time: 07/20/22 10:34 PM  Result Value Ref Range   Alcohol, Ethyl (B) <10 <10 mg/dL    Comment: (NOTE) Lowest detectable limit for serum alcohol is 10 mg/dL.  For medical purposes only. Performed at [facility], [place] [address], [place], [place] [address]   Rapid urine drug screen (hospital performed)     Status: Abnormal   Collection Time: 07/20/22 10:34 PM  Result Value Ref Range   Opiates NONE DETECTED NONE DETECTED   Cocaine NONE DETECTED NONE DETECTED   Benzodiazepines NONE DETECTED NONE DETECTED   Amphetamines NONE DETECTED NONE DETECTED   Tetrahydrocannabinol POSITIVE (A) NONE DETECTED   Barbiturates NONE DETECTED NONE DETECTED    Comment: (NOTE) DRUG SCREEN FOR MEDICAL PURPOSES ONLY.  IF CONFIRMATION IS NEEDED FOR ANY PURPOSE, NOTIFY LAB WITHIN 5 DAYS.  LOWEST DETECTABLE LIMITS FOR URINE DRUG SCREEN Drug Class                     Cutoff (ng/mL) Amphetamine and metabolites    1000 Barbiturate and metabolites    200 Benzodiazepine                 200 Opiates and metabolites        300 Cocaine and metabolites        300 THC                            50 Performed at [facility], [place] [address], [place], [place] [address]   Urinalysis, Routine w reflex microscopic -Urine, Clean Catch     Status: Abnormal   Collection Time: 07/20/22 10:34 PM  Result Value Ref Range   Color, Urine YELLOW YELLOW   APPearance HAZY (A) CLEAR   Specific Gravity, Urine >1.030 (H) 1.005 - 1.030   pH 7.0 5.0 - 8.0   Glucose, UA 100 (A) NEGATIVE mg/dL   Hgb urine dipstick MODERATE (A) NEGATIVE   Bilirubin Urine NEGATIVE NEGATIVE   Ketones, ur NEGATIVE NEGATIVE mg/dL   Protein, ur 100 (A) NEGATIVE mg/dL   Nitrite NEGATIVE NEGATIVE   Leukocytes,Ua NEGATIVE NEGATIVE    Comment: Performed at [facility] [address], [place], [place] [address]  Urinalysis, Microscopic (reflex)     Status: Abnormal   Collection Time: 07/20/22 10:34 PM  Result Value Ref Range   RBC /  HPF 11-20 0 - 5 RBC/hpf   WBC, UA 11-20 0 - 5 WBC/hpf   Bacteria, UA RARE (A) NONE SEEN   Squamous Epithelial / HPF 0-5 0 - 5 /HPF   Mucus PRESENT     Comment: Performed at [facility], [place] [address], [place], [place] [address]  Magnesium     Status: None   Collection Time: 07/20/22 10:34 PM  Result Value Ref Range   Magnesium 2.3 1.7 - 2.4 mg/dL    Comment: Performed at [facility] [address], [place], [place] [address]  Hemoglobin A1c     Status: Abnormal   Collection Time: 07/20/22 10:34 PM  Result Value Ref Range   Hgb A1c MFr Bld 4.6 (L) 4.8 - 5.6 %  Comment: (NOTE) Pre diabetes:          5.7%-6.4%  Diabetes:              >6.4%  Glycemic control for   <7.0% adults with diabetes    Mean Plasma Glucose 85.32 mg/dL    Comment: Performed at [facility] [address], [place], [place] [address]  I-Stat arterial blood gas, ED     Status: Abnormal   Collection Time: 07/20/22 11:15 PM  Result Value Ref Range   pH, Arterial 7.304 (L) 7.35 - 7.45   pCO2 arterial 33.2 32 - 48 mmHg   pO2, Arterial 353 (H) 83 - 108 mmHg   Bicarbonate 16.8 (L) 20.0 - 28.0 mmol/L   TCO2 18 (L) 22 - 32 mmol/L   O2 Saturation 100 %   Acid-base deficit 9.0 (H) 0.0 - 2.0 mmol/L   Sodium 141 135 - 145 mmol/L   Potassium 3.5 3.5 - 5.1 mmol/L   Calcium, Ion 1.13 (L) 1.15 - 1.40 mmol/L   HCT 38.0 36.0 - 46.0 %   Hemoglobin 12.9 12.0 - 15.0 g/dL   Patient temperature 35.2 C    Collection site RADIAL, ALLEN'S TEST ACCEPTABLE    Drawn by RT    Sample type ARTERIAL    CT Head Wo Contrast  Result Date: 07/21/2022 CLINICAL DATA:  Altered mental status EXAM: CT HEAD WITHOUT CONTRAST TECHNIQUE: Contiguous axial images were obtained from the base of the skull through the vertex without intravenous contrast. RADIATION DOSE REDUCTION: This exam was performed according to the departmental dose-optimization program which includes automated exposure control, adjustment of the mA and/or  kV according to patient size and/or use of iterative reconstruction technique. COMPARISON:  None Available. FINDINGS: Brain: No evidence of acute infarction, hemorrhage, hydrocephalus, extra-axial collection or mass lesion/mass effect. Vascular: No hyperdense vessel or unexpected calcification. Skull: Normal. Negative for fracture or focal lesion. Sinuses/Orbits: No acute finding. Other: None. IMPRESSION: No acute intracranial abnormality noted. Electronically Signed   By: Inez Catalina M.D.   On: 07/21/2022 00:12   CT Angio Chest PE W and/or Wo Contrast  Result Date: 07/21/2022 CLINICAL DATA:  Difficulty breathing, status post intubation EXAM: CT ANGIOGRAPHY CHEST WITH CONTRAST TECHNIQUE: Multidetector CT imaging of the chest was performed using the standard protocol during bolus administration of intravenous contrast. Multiplanar CT image reconstructions and MIPs were obtained to evaluate the vascular anatomy. RADIATION DOSE REDUCTION: This exam was performed according to the departmental dose-optimization program which includes automated exposure control, adjustment of the mA and/or kV according to patient size and/or use of iterative reconstruction technique. CONTRAST:  61m OMNIPAQUE IOHEXOL 350 MG/ML SOLN COMPARISON:  Chest x-ray from earlier in the same day. FINDINGS: Cardiovascular: Thoracic aorta its branches are within normal limits. No aneurysmal dilatation or dissection is seen. No cardiac enlargement is noted. No filling defect to suggest pulmonary embolism is noted. Pulmonary artery shows a normal branching pattern. Mediastinum/Nodes: Thoracic inlet is within normal limits. Endotracheal tube and gastric catheter are noted in satisfactory position. Esophagus is within normal limits. No hilar or mediastinal adenopathy is noted. Lungs/Pleura: The lungs are well aerated bilaterally. No focal infiltrate or effusion is seen. Some inspissated material is noted in the right lower lobe bronchial tree. Mild  basilar atelectasis is noted. Upper Abdomen: Visualized upper abdomen is within normal limits. Musculoskeletal: No acute rib abnormality is noted. No compression deformity is seen. Review of the MIP images confirms the above findings. IMPRESSION: No evidence of  pulmonary emboli. Mild inspissated material in the right lower lobe bronchial tree likely related to mucous. Mild bibasilar atelectasis. Electronically Signed   By: Inez Catalina M.D.   On: 07/21/2022 00:11   DG Chest Portable 1 View  Result Date: 07/20/2022 CLINICAL DATA:  Post intubation. EXAM: PORTABLE CHEST 1 VIEW COMPARISON:  None Available. FINDINGS: The heart is normal in size. Lungs are clear without evidence of focal consolidation or pleural effusion no acute osseous abnormality. Endotracheal tube with distal tip approximately 4.5 cm above the carina. Right PICC with distal tip likely in the IVC. Feeding tube coursing below the diaphragm with distal tip not included. IMPRESSION: 1. Endotracheal tube with distal tip approximately 4.5 cm above the carina. 2. Right PICC with distal tip likely in the IVC. Electronically Signed   By: Keane Police D.O.   On: 07/20/2022 23:07    Pending Labs Unresulted Labs (From admission, onward)     Start     Ordered   07/21/22 0500  Comprehensive metabolic panel  Tomorrow morning,   R        07/20/22 2351   07/21/22 0500  CBC  Daily,   R      07/20/22 2351   07/21/22 0500  Magnesium  Tomorrow morning,   R        07/20/22 2351   07/21/22 0004  HIV Antibody (routine testing w rflx)  (HIV Antibody (Routine testing w reflex) panel)  Once,   R        07/21/22 0005   07/21/22 0000  Acetaminophen level  Once,   STAT        07/20/22 2359   [id number]  Salicylate level  Once,   STAT        07/20/22 2359   07/20/22 2357  Lactic acid, plasma  STAT Now then every 3 hours,   R (with STAT occurrences)      07/20/22 2357   07/20/22 2314  Blood gas, arterial  Once,   R        07/20/22 2314   07/20/22 2234   Hepatic function panel  Once,   STAT        07/20/22 2234            Vitals/Pain Today's Vitals   07/20/22 2250 07/20/22 2300 07/20/22 2330 07/21/22 0000  BP:  (!) 134/99 (!) 141/107 (!) 141/128  Pulse:  (!) 158 96 (!) 103  Resp: 18 18 (!) 23 (!) 23  Temp: (!) 95.1 F (35.1 C) (!) 95.4 F (35.2 C) (!) 95.4 F (35.2 C)   SpO2:  100% 100% 92%  Height:        Isolation Precautions No active isolations  Medications Medications  potassium chloride (KLOR-CON) packet 40 mEq (has no administration in time range)  potassium chloride 10 mEq in 100 mL IVPB (has no administration in time range)  magnesium sulfate IVPB 2 g 50 mL (has no administration in time range)  calcium gluconate 1 g/ 50 mL sodium chloride IVPB (has no administration in time range)  docusate (COLACE) 50 MG/5ML liquid 100 mg (has no administration in time range)  polyethylene glycol (MIRALAX / GLYCOLAX) packet 17 g (has no administration in time range)  fentaNYL (SUBLIMAZE) injection 50 mcg (has no administration in time range)  fentaNYL (SUBLIMAZE) injection 50-200 mcg (150 mcg Intravenous Given 07/21/22 0012)  docusate sodium (COLACE) capsule 100 mg (has no administration in time range)  polyethylene glycol (MIRALAX / GLYCOLAX) packet 17  g (has no administration in time range)  famotidine (PEPCID) tablet 20 mg (has no administration in time range)  acetaminophen (TYLENOL) tablet 650 mg (has no administration in time range)  busPIRone (BUSPAR) tablet 30 mg (has no administration in time range)    Or  busPIRone (BUSPAR) tablet 30 mg (has no administration in time range)  etomidate (AMIDATE) injection 30 mg (30 mg Intravenous Given 07/20/22 2216)  rocuronium bromide 10 mg/mL (PF) syringe (70 mg Intravenous Given 07/20/22 2218)  sodium chloride 0.9 % bolus 1,000 mL (0 mLs Intravenous Stopped 07/20/22 2309)  lactated ringers bolus 1,000 mL (1,000 mLs Intravenous New Bag/Given 07/21/22 0019)  iohexol (OMNIPAQUE) 350  MG/ML injection 75 mL (75 mLs Intravenous Contrast Given 07/21/22 0000)    Mobility walks     Focused Assessments     R Recommendations: See Admitting Provider Note  Report given to:   Additional Notes:

## 2022-07-21 NOTE — Progress Notes (Addendum)
NAME:  Alexandra Freeman, MRN:  [id number], DOB:  September 05, 1995, LOS: 0 ADMISSION DATE:  07/20/2022, CONSULTATION DATE:  2/18 REFERRING MD:  Dr. Langston Masker, CHIEF COMPLAINT:  cardiac arrest   History of Present Illness:  Patient is a 27 year old female pertinent PMH menorrhagia on birth control presents to [facility] ED on 2/18 cardiac arrest.  Per EMS, patient was at home-- had smoking weed with family earlier that night (pt and fam smoked, not just pt) and then was sitting on couch laughing w moms when suddenly pt slumped over. 1 mom checked pulse and was pulseless, started chest compressions. Other mom called EMS. Down for approximately 6 to 7 minutes with no CPR prior to EMS arrival.  Patient found to be in V-fib arrest, defibrillated, and received 11 minutes of CPR until ROSC. Patient post ROSC in vtach was then cardioverted and given amio with sinus rhythm. Patient transported to [facility].  In route to [facility], patient biting at igel supraglottic airway and was given 50 mics of fentanyl.  Upon arrival to [facility] ED, patient vitals stable moving extremities.  Temp 95.1 F.  Airway exchanged for ETT. CXR showing ETT in good position. CTA chest without PE. CT head no acute abnormality. EKG qtc 495, no ST elevations. ABG 7.24, 41, 191, 18.  Potassium 3.4, glucose 185, creatinine 1.28, calcium 8.3.  Troponin 107.  Ethanol WNL.  UDS positive for THC.  UA unremarkable.  PCCM consulted for ICU admission.  Pertinent  Medical History   Past Medical History:  Diagnosis Date   Medical history non-contributory    Menorrhagia      Significant Hospital Events: Including procedures, antibiotic start and stop dates in addition to other pertinent events   2/18 admitted post cardiac arrest 2/19 EEG, ECHO   Interim History / Subjective:  Question of possible sz like activity  On EEG   Starting ECHO  Objective   Blood pressure (!) 99/57, pulse 66, temperature (!) 97.5 F (36.4 C), temperature source Bladder, resp. rate 19,  height 5' 5"$  (1.651 m), weight 69.1 kg, SpO2 98 %, not currently breastfeeding.    Vent Mode: PRVC FiO2 (%):  [40 %-100 %] 40 % Set Rate:  [18 bmp] 18 bmp Vt Set:  [450 mL-500 mL] 450 mL PEEP:  [5 cmH20] 5 cmH20 Plateau Pressure:  [11 cmH20-13 cmH20] 13 cmH20   Intake/Output Summary (Last 24 hours) at 07/21/2022 1138 Last data filed at 07/21/2022 1100 Gross per 24 hour  Intake 2508.95 ml  Output 1020 ml  Net 1488.95 ml   Filed Weights   07/21/22 0054 07/21/22 0500  Weight: 69.1 kg 69.1 kg    Examination: General:   WDWN critically ill appearing young adult, intubated sedated HEENT: MM pink/moist; ETT in place Neuro: sedated, does not follow commands. Spontaneous movement BUEBLE to painful stimuli but not purposeful CV: rr s1s2  PULM:  CTAb mechanically ventilated  GI: soft ndnt  Extremities:  no obvious acute joint deformity  Skin: c/d/w   Resolved Hospital Problem list     Assessment & Plan:   Witnessed cardiac arrest, VF   -6-7 min down with bystander CPR prior to EMS arrival. In VF on EMS arrival, 49mn resusc --> VT w pulse, --> Sinus after CV + amio  -so about 17-18 min downtime total -etiology is unclear. Dont really think this would be drug related, wasn't the only one smoking so seems unlikely to be laced weed problem since others are ok -no acute intracranial process -Some minor  lyte abnormalities  -only mild trop bump  -no PE P -ECHO today -- wondering if etiology might be something structural?   -will needs cards eval this admission and anticipate a pacemaker (pending neuro recovery)   -PRN EKG -avoid fevers -supportive care -lytes as below    Acute encephalopathy  ?Sz like activity  -fortunately, initial CT H normal and EEG without sz activity -purposeful in ED / at time of admit -obviously greatest concern would be anoxic encephalopathy, but with reported purposeful movement in ED hopefully not. Sedation certainly contributing right now P -wean  sedation as able -imaging in 72hrs if mentation not improved w sedation changes   Acute respiratory failure due to above Possible aspiration P -wean MV as able -VAP, pulm hygiene -add empiric unasyn   AKI NAGMA P -trend renal indices, UOP -minimize nephrotoxic agents   Elevated LFTs -most likely in setting of arrest -follow PRN  Elevated trops -due to arrest P -PRN EKG, PRN trops   Lactic acidosis -due to arrest P -follow PRN   Leukocytosis -hopefully reactive in setting of arrest -no preceding sx c/w acute infectious illness P -adding unasyn for possible aspiration 2/19  -at this point dont think we have reason to pan cx, but consider pending WBC and fever curve   Mild hyperglycemia P -SSI   Best Practice (right click and "Reselect all SmartList Selections" daily)   Diet/type: NPO w/ meds via tube DVT prophylaxis: prophylactic heparin  GI prophylaxis: H2B Lines: N/A Foley:  Yes, and it is still needed Code Status:  full code Last date of multidisciplinary goals of care discussion [mom, aunts updated at bedside]  Labs   CBC: Recent Labs  Lab 07/20/22 2234 07/20/22 2315 07/21/22 0015  WBC 10.3  --  21.6*  NEUTROABS 6.7  --   --   HGB 12.4  13.6 12.9 12.4  HCT 37.4  40.0 38.0 39.8  MCV 93.0  --  95.2  PLT 217  --  [id number]    Basic Metabolic Panel: Recent Labs  Lab 07/20/22 2234 07/20/22 2315 07/21/22 0015 07/21/22 0749  NA 137  142 141 137 135  K 3.4*  3.4* 3.5 3.4* 4.3  CL 108  --  110 111  CO2 18*  --  18* 19*  GLUCOSE 185*  --  171* 111*  BUN 11  --  9 9  CREATININE 1.28*  --  1.22* 0.79  CALCIUM 8.3*  --  7.6* 8.0*  MG 2.3  --  2.1 2.1   GFR: Estimated Creatinine Clearance: 103.1 mL/min (by C-G formula based on SCr of 0.79 mg/dL). Recent Labs  Lab 07/20/22 2234 07/21/22 0015 07/21/22 0144  WBC 10.3 21.6*  --   LATICACIDVEN  --  3.2* 2.5*    Liver Function Tests: Recent Labs  Lab 07/20/22 2234 07/21/22 0015  AST 141*  145*  ALT 92* 88*  ALKPHOS 88 88  BILITOT 0.4 0.8  PROT 7.0 6.8  ALBUMIN 3.7 3.8   No results for input(s): "LIPASE", "AMYLASE" in the last 168 hours. No results for input(s): "AMMONIA" in the last 168 hours.  ABG    Component Value Date/Time   PHART 7.304 (L) 07/20/2022 2315   PCO2ART 33.2 07/20/2022 2315   PO2ART 353 (H) 07/20/2022 2315   HCO3 16.8 (L) 07/20/2022 2315   TCO2 18 (L) 07/20/2022 2315   ACIDBASEDEF 9.0 (H) 07/20/2022 2315   O2SAT 100 07/20/2022 2315     Coagulation Profile: No results for input(s): "INR", "  PROTIME" in the last 168 hours.  Cardiac Enzymes: No results for input(s): "CKTOTAL", "CKMB", "CKMBINDEX", "TROPONINI" in the last 168 hours.  HbA1C: Hgb A1c MFr Bld  Date/Time Value Ref Range Status  07/20/2022 10:34 PM 4.6 (L) 4.8 - 5.6 % Final    Comment:    (NOTE) Pre diabetes:          5.7%-6.4%  Diabetes:              >6.4%  Glycemic control for   <7.0% adults with diabetes     CBG: Recent Labs  Lab 07/21/22 0402 07/21/22 0808  GLUCAP 135* 105*   CRITICAL CARE Performed by: Cristal Generous   Total critical care time: 38 minutes  Critical care time was exclusive of separately billable procedures and treating other patients. Critical care was necessary to treat or prevent imminent or life-threatening deterioration.  Critical care was time spent personally by me on the following activities: development of treatment plan with patient and/or surrogate as well as nursing, discussions with consultants, evaluation of patient's response to treatment, examination of patient, obtaining history from patient or surrogate, ordering and performing treatments and interventions, ordering and review of laboratory studies, ordering and review of radiographic studies, pulse oximetry and re-evaluation of patient's condition.  Eliseo Gum MSN, AGACNP-BC [place] for pager  07/21/2022, 11:38 AM

## 2022-07-21 NOTE — Progress Notes (Signed)
eLink Physician-Brief Progress Note Patient Name: Alexandra Freeman DOB: [birth year] MRN: [id number]   Date of Service  07/21/2022  HPI/Events of Note  Patient has arrived to ICU and I was notified of a seizure like activity. Seen on camera. Legs clerly showing purposeful activity. Arms showing some contractions especially in wrist area and looks like a possible seizure but not sure. Given 2 mg versed and resolved. Patient came up on 200 mic/hour of fentanyl drip and not on any pressors. Is on 50%/peep 5/ RR 18, VT 450 and with stable bp and o2 sat 99. Has sinus tach. Has K and mag ordered which RN will be giving. D/w Alexandra Freeman from CCM. Has had purposeful activity earlier in ER and here in ICU as well   eICU Interventions  Add propofol  EEG  Replete lytes Rest per CCM  Restraints ordered D/w RN      Intervention Category Major Interventions: Respiratory failure - evaluation and management Evaluation Type: New Patient Evaluation  Laurabeth Yip G Gissell Barra 07/21/2022, 1:08 AM

## 2022-07-21 NOTE — Progress Notes (Signed)
Pharmacy Antibiotic Note  Alexandra Freeman is a 27 y.o. female admitted on 07/20/2022 s/p cardiac arrest and concern for aspiration PNA.  Pharmacy has been consulted for Unasyn dosing. -WBC= 21.6, afebrile, CrCl ~ 100  Plan: -Unasyn 3gm IV q8h -Will follow renal function, cultures and clinical progress   Height: 5' 5"$  (165.1 cm) Weight: 69.1 kg (152 lb 5.4 oz) IBW/kg (Calculated) : 57  Temp (24hrs), Avg:96.8 F (36 C), Min:95.1 F (35.1 C), Max:97.5 F (36.4 C)  Recent Labs  Lab 07/20/22 2234 07/21/22 0015 07/21/22 0144 07/21/22 0749  WBC 10.3 21.6*  --   --   CREATININE 1.28* 1.22*  --  0.79  LATICACIDVEN  --  3.2* 2.5*  --     Estimated Creatinine Clearance: 103.1 mL/min (by C-G formula based on SCr of 0.79 mg/dL).    No Known Allergies  Antimicrobials this admission: 2/19 Unasyn>>  Dose adjustments this admission:    Thank you for allowing pharmacy to be a part of this patient's care.  Hildred Laser, PharmD Clinical Pharmacist **Pharmacist phone directory can now be found on [email or web address] ([email or web address]).  Listed under [place].

## 2022-07-21 NOTE — Progress Notes (Signed)
STAT EEG complete - results pending. Neuro notified.

## 2022-07-21 NOTE — Progress Notes (Signed)
eLink Physician-Brief Progress Note Patient Name: Alexandra Freeman DOB: [birth year] MRN: [id number]   Date of Service  07/21/2022  HPI/Events of Note  Very agitated, all over the bed, and even with restraints, ETT at risk of dislodgement. RN had gone up on fentanyl to 200 mic/hour and is on 0.6 mic of precedex. Precedex not able to be uptitrated since her HR is 45-48 (BP ok). Seen on camera. I am told she was switched from propofol to precedex today but she is very restless, moving around, though not following commands.   eICU Interventions  Changed the ceiling dose of fentanyl to 300 mic/hour Add PRN versed D/w RN      Intervention Category Major Interventions: Respiratory failure - evaluation and management  Margaretmary Lombard 07/21/2022, 10:29 PM

## 2022-07-21 NOTE — Progress Notes (Signed)
RT called to bedside due to patient biting on the ETT.  Bite block placed and secured, patient has good vital volumes.

## 2022-07-21 NOTE — Procedures (Signed)
Patient Name: Alexandra Freeman  MRN: [id number]  Epilepsy Attending: Lora Havens  Referring Physician/Provider: Margaretmary Lombard, MD  Date: 07/21/2022 Duration: 22.59 mins  Patient history: 27yo F s/p cardiac arrest.   Level of alertness: Awake  AEDs during EEG study: Propofol  Technical aspects: This EEG study was done with scalp electrodes positioned according to the 10-20 International system of electrode placement. Electrical activity was reviewed with band pass filter of 1-70Hz$ , sensitivity of 7 uV/mm, display speed of 34m/sec with a 60Hz$  notched filter applied as appropriate. EEG data were recorded continuously and digitally stored.  Video monitoring was available and reviewed as appropriate.  Description: The posterior dominant rhythm consists of 9 Hz activity of moderate voltage (25-35 uV) seen predominantly in posterior head regions, symmetric and reactive to eye opening and eye closing. EEG showed continuous generalized 3 to 6 Hz theta-delta slowing. Hyperventilation and photic stimulation were not performed.     ABNORMALITY - Continuous slow, generalized  IMPRESSION: This study is suggestive of moderate diffuse encephalopathy, nonspecific etiology. No seizures or epileptiform discharges were seen throughout the recording.   Adeleigh Barletta OBarbra Sarks

## 2022-07-22 DIAGNOSIS — I469 Cardiac arrest, cause unspecified: Secondary | ICD-10-CM | POA: Diagnosis not present

## 2022-07-22 DIAGNOSIS — Z9911 Dependence on respirator [ventilator] status: Secondary | ICD-10-CM | POA: Diagnosis not present

## 2022-07-22 DIAGNOSIS — J9601 Acute respiratory failure with hypoxia: Secondary | ICD-10-CM | POA: Diagnosis not present

## 2022-07-22 DIAGNOSIS — I4901 Ventricular fibrillation: Secondary | ICD-10-CM

## 2022-07-22 LAB — GLUCOSE, CAPILLARY
Glucose-Capillary: 104 mg/dL — ABNORMAL HIGH (ref 70–99)
Glucose-Capillary: 110 mg/dL — ABNORMAL HIGH (ref 70–99)
Glucose-Capillary: 108 mg/dL — ABNORMAL HIGH (ref 70–99)

## 2022-07-22 LAB — PHOSPHORUS
Phosphorus: 3.1 mg/dL (ref 2.5–4.6)
Phosphorus: 2.1 mg/dL — ABNORMAL LOW (ref 2.5–4.6)

## 2022-07-22 LAB — TRIGLYCERIDES: Triglycerides: 48 mg/dL (ref <150)

## 2022-07-22 LAB — MAGNESIUM
Magnesium: 2.1 mg/dL (ref 1.7–2.4)
Magnesium: 2 mg/dL (ref 1.7–2.4)

## 2022-07-22 LAB — CBC
HCT: 32.9 % — ABNORMAL LOW (ref 36.0–46.0)
Hemoglobin: 10.8 g/dL — ABNORMAL LOW (ref 12.0–15.0)
MCH: 30.6 pg (ref 26.0–34.0)
MCHC: 32.8 g/dL (ref 30.0–36.0)
MCV: 93.2 fL (ref 80.0–100.0)
Platelets: 147 10*3/uL — ABNORMAL LOW (ref 150–400)
RBC: 3.53 MIL/uL — ABNORMAL LOW (ref 3.87–5.11)
RDW: 14.7 % (ref 11.5–15.5)
WBC: 12.2 10*3/uL — ABNORMAL HIGH (ref 4.0–10.5)
nRBC: 0 % (ref 0.0–0.2)

## 2022-07-22 LAB — BASIC METABOLIC PANEL
Anion gap: 7 (ref 5–15)
BUN: 8 mg/dL (ref 6–20)
CO2: 21 mmol/L — ABNORMAL LOW (ref 22–32)
Calcium: 8.3 mg/dL — ABNORMAL LOW (ref 8.9–10.3)
Chloride: 110 mmol/L (ref 98–111)
Creatinine, Ser: 0.77 mg/dL (ref 0.44–1.00)
GFR, Estimated: >60 mL/min (ref >60)
Glucose, Bld: 108 mg/dL — ABNORMAL HIGH (ref 70–99)
Potassium: 4.4 mmol/L (ref 3.5–5.1)
Sodium: 138 mmol/L (ref 135–145)

## 2022-07-22 MED ORDER — ONDANSETRON HCL 4 MG/2ML IJ SOLN
4.0000 mg | Freq: Four times a day (QID) | INTRAMUSCULAR | Status: DC | PRN
  Administered 2022-07-22 (×2): 4 mg via INTRAVENOUS
  Filled 2022-07-22 (×2): qty 2

## 2022-07-22 MED ORDER — SODIUM CHLORIDE 0.9 % WEIGHT BASED INFUSION
1.0000 mL/kg/h | INTRAVENOUS | Status: DC
  Administered 2022-07-23: 1 mL/kg/h via INTRAVENOUS

## 2022-07-22 MED ORDER — KETOROLAC TROMETHAMINE 15 MG/ML IJ SOLN
15.0000 mg | Freq: Three times a day (TID) | INTRAMUSCULAR | Status: AC | PRN
  Administered 2022-07-22 – 2022-07-23 (×4): 15 mg via INTRAVENOUS
  Filled 2022-07-22 (×4): qty 1

## 2022-07-22 MED ORDER — SODIUM CHLORIDE 0.9 % IV SOLN
12.5000 mg | Freq: Four times a day (QID) | INTRAVENOUS | Status: DC | PRN
  Administered 2022-07-22: 12.5 mg via INTRAVENOUS
  Filled 2022-07-22: qty 12.5

## 2022-07-22 MED ORDER — SODIUM CHLORIDE 0.9 % WEIGHT BASED INFUSION: 3.0000 mL/kg/h | INTRAVENOUS | Status: DC

## 2022-07-22 MED ORDER — SODIUM CHLORIDE 0.9% FLUSH: 3.0000 mL | INTRAVENOUS | Status: DC | PRN

## 2022-07-22 MED ORDER — SODIUM CHLORIDE 0.9% FLUSH
3.0000 mL | Freq: Two times a day (BID) | INTRAVENOUS | Status: DC
  Administered 2022-07-22 – 2022-07-23 (×2): 3 mL via INTRAVENOUS

## 2022-07-22 MED ORDER — OXYCODONE HCL 5 MG PO TABS
5.0000 mg | ORAL_TABLET | ORAL | Status: AC | PRN
  Administered 2022-07-23 (×2): 5 mg via ORAL
  Filled 2022-07-22 (×3): qty 1

## 2022-07-22 MED ORDER — ASPIRIN 81 MG PO CHEW
81.0000 mg | CHEWABLE_TABLET | ORAL | Status: AC
  Administered 2022-07-23: 81 mg via ORAL
  Filled 2022-07-22: qty 1

## 2022-07-22 MED ORDER — SODIUM CHLORIDE 0.9 % IV SOLN: 250.0000 mL | INTRAVENOUS | Status: DC | PRN

## 2022-07-22 MED ORDER — LACTATED RINGERS IV SOLN: INTRAVENOUS | Status: DC

## 2022-07-22 MED ORDER — POTASSIUM & SODIUM PHOSPHATES 280-160-250 MG PO PACK
2.0000 | PACK | Freq: Once | ORAL | Status: AC
  Administered 2022-07-22: 2 via ORAL
  Filled 2022-07-22: qty 2

## 2022-07-22 MED ORDER — PROMETHAZINE HCL 25 MG RE SUPP: 25.0000 mg | Freq: Four times a day (QID) | RECTAL | Status: DC | PRN

## 2022-07-22 NOTE — Progress Notes (Signed)
NAME:  Alexandra Freeman, MRN:  [id number], DOB:  15-Sep-1995, LOS: 1 ADMISSION DATE:  07/20/2022, CONSULTATION DATE:  2/18 REFERRING MD:  Dr. Langston Masker, CHIEF COMPLAINT:  cardiac arrest   History of Present Illness:  Patient is a 27 year old female pertinent PMH menorrhagia on birth control presents to [facility] ED on 2/18 cardiac arrest.  Per EMS, patient was at home-- had smoking weed with family earlier that night (pt and fam smoked, not just pt) and then was sitting on couch laughing w moms when suddenly pt slumped over. 1 mom checked pulse and was pulseless, started chest compressions. Other mom called EMS. Down for approximately 6 to 7 minutes with no CPR prior to EMS arrival.  Patient found to be in V-fib arrest, defibrillated, and received 11 minutes of CPR until ROSC. Patient post ROSC in vtach was then cardioverted and given amio with sinus rhythm. Patient transported to [facility].  In route to [facility], patient biting at igel supraglottic airway and was given 50 mics of fentanyl.  Upon arrival to [facility] ED, patient vitals stable moving extremities.  Temp 95.1 F.  Airway exchanged for ETT. CXR showing ETT in good position. CTA chest without PE. CT head no acute abnormality. EKG qtc 495, no ST elevations. ABG 7.24, 41, 191, 18.  Potassium 3.4, glucose 185, creatinine 1.28, calcium 8.3.  Troponin 107.  Ethanol WNL.  UDS positive for THC.  UA unremarkable.  PCCM consulted for ICU admission.  Pertinent  Medical History   Past Medical History:  Diagnosis Date   Medical history non-contributory    Menorrhagia      Significant Hospital Events: Including procedures, antibiotic start and stop dates in addition to other pertinent events   2/18 admitted post cardiac arrest 2/19 EEG, ECHO with LVEF 50-55% no wall motion abnormalities 2/20 extubated, emesis   Interim History / Subjective:   This morning more awake  Objective   Blood pressure (!) 96/47, pulse 62, temperature 98.8 F (37.1 C), resp. rate 18,  height 5' 5"$  (1.651 m), weight 67.8 kg, SpO2 100 %, not currently breastfeeding.    Vent Mode: PRVC FiO2 (%):  [30 %-40 %] 30 % Set Rate:  [18 bmp] 18 bmp Vt Set:  [450 mL] 450 mL PEEP:  [5 cmH20] 5 cmH20 Plateau Pressure:  [13 cmH20-14 cmH20] 14 cmH20   Intake/Output Summary (Last 24 hours) at 07/22/2022 0947 Last data filed at 07/22/2022 0600 Gross per 24 hour  Intake 1379 ml  Output 545 ml  Net 834 ml   Filed Weights   07/21/22 0054 07/21/22 0500 07/22/22 0400  Weight: 69.1 kg 69.1 kg 67.8 kg    Examination: General:   wdwn F NAD  HEENT: NCAT pink mm  Neuro: following commands CV: rr s1s2  PULM:  symmetrical chest expansion  GI: soft ndnt  Extremities:  no acute joint deformity  Skin: c/d/w   Resolved Hospital Problem list    AKI  Lactic acidosis  Assessment & Plan:    Witnessed VF arrest  -6-7 min down with bystander CPR prior to EMS arrival. In VF on EMS arrival, 12mn resusc --> VT w pulse, --> Sinus after CV + amio  -so about 17-18 min downtime total -etiology is unclear. Dont really think this would be drug related, wasn't the only one smoking so seems unlikely to be laced weed problem since others are ok -no acute intracranial process -no fam hx of sudden cardiac death  P -will consult cards, think will need EP   -  supportive care  -lytes as below   Acute encephalopathy -at this point probably med related more than anything P -dc sedation w extubation -delirium precautions   Acute respiratory failure due to above Possible aspiration P -extubate -pulm hygiene, IS mobility -unasyn   Elevated LFTs -most likely in setting of arrest -follow PRN  Elevated trops -due to arrest P -PRN EKG, PRN trops   Leukocytosis, improving  -hopefully reactive in setting of arrest -no preceding sx c/w acute infectious illness P -adding unasyn for possible aspiration 2/19  -at this point dont think we have reason to pan cx, but consider pending WBC and  fever curve   N/v -post extubation 2/20 -add zofran -cont IVF  -replace lytes as needed   Best Practice (right click and "Reselect all SmartList Selections" daily)   Diet/type: NPO w/ meds via tube DVT prophylaxis: prophylactic heparin  GI prophylaxis: H2B Lines: N/A Foley:  Yes, and it is no longer needed and removal ordered  Code Status:  full code Last date of multidisciplinary goals of care discussion [mom at bedside 2/20   Labs   CBC: Recent Labs  Lab 07/20/22 2234 07/20/22 2315 07/21/22 0015 07/22/22 0453  WBC 10.3  --  21.6* 12.2*  NEUTROABS 6.7  --   --   --   HGB 12.4  13.6 12.9 12.4 10.8*  HCT 37.4  40.0 38.0 39.8 32.9*  MCV 93.0  --  95.2 93.2  PLT 217  --  257 147*    Basic Metabolic Panel: Recent Labs  Lab 07/20/22 2234 07/20/22 2315 07/21/22 0015 07/21/22 0749 07/21/22 1748 07/22/22 0453  NA 137  142 141 137 135  --   --   K 3.4*  3.4* 3.5 3.4* 4.3  --   --   CL 108  --  110 111  --   --   CO2 18*  --  18* 19*  --   --   GLUCOSE 185*  --  171* 111*  --   --   BUN 11  --  9 9  --   --   CREATININE 1.28*  --  1.22* 0.79  --   --   CALCIUM 8.3*  --  7.6* 8.0*  --   --   MG 2.3  --  2.1 2.1 2.0 2.1  PHOS  --   --   --   --  2.8 3.1   GFR: Estimated Creatinine Clearance: 95 mL/min (by C-G formula based on SCr of 0.79 mg/dL). Recent Labs  Lab 07/20/22 2234 07/21/22 0015 07/21/22 0144 07/22/22 0453  WBC 10.3 21.6*  --  12.2*  LATICACIDVEN  --  3.2* 2.5*  --     Liver Function Tests: Recent Labs  Lab 07/20/22 2234 07/21/22 0015  AST 141* 145*  ALT 92* 88*  ALKPHOS 88 88  BILITOT 0.4 0.8  PROT 7.0 6.8  ALBUMIN 3.7 3.8   No results for input(s): "LIPASE", "AMYLASE" in the last 168 hours. No results for input(s): "AMMONIA" in the last 168 hours.  ABG    Component Value Date/Time   PHART 7.304 (L) 07/20/2022 2315   PCO2ART 33.2 07/20/2022 2315   PO2ART 353 (H) 07/20/2022 2315   HCO3 16.8 (L) 07/20/2022 2315   TCO2 18 (L)  07/20/2022 2315   ACIDBASEDEF 9.0 (H) 07/20/2022 2315   O2SAT 100 07/20/2022 2315     Coagulation Profile: No results for input(s): "INR", "PROTIME" in the last 168 hours.  Cardiac Enzymes:  No results for input(s): "CKTOTAL", "CKMB", "CKMBINDEX", "TROPONINI" in the last 168 hours.  HbA1C: Hgb A1c MFr Bld  Date/Time Value Ref Range Status  07/20/2022 10:34 PM 4.6 (L) 4.8 - 5.6 % Final    Comment:    (NOTE) Pre diabetes:          5.7%-6.4%  Diabetes:              >6.4%  Glycemic control for   <7.0% adults with diabetes     CBG: Recent Labs  Lab 07/21/22 1630 07/21/22 1945 07/21/22 2326 07/22/22 0411 07/22/22 0800  GLUCAP 102* 106* 115* 104* 110*   CRITICAL CARE Performed by: Cristal Generous  Total critical care time: 36 minutes  Critical care time was exclusive of separately billable procedures and treating other patients. Critical care was necessary to treat or prevent imminent or life-threatening deterioration.  Critical care was time spent personally by me on the following activities: development of treatment plan with patient and/or surrogate as well as nursing, discussions with consultants, evaluation of patient's response to treatment, examination of patient, obtaining history from patient or surrogate, ordering and performing treatments and interventions, ordering and review of laboratory studies, ordering and review of radiographic studies, pulse oximetry and re-evaluation of patient's condition.  Eliseo Gum MSN, AGACNP-BC [place] for pager  07/22/2022, 9:47 AM

## 2022-07-22 NOTE — H&P (View-Only) (Signed)
Cardiology Consultation   Patient ID: Alexandra Freeman MRN: [id number]; DOB: Nov 25, 1995  Admit date: 07/20/2022 Date of Consult: 07/22/2022  PCP:  Patient, No Pcp Per   [facility] Providers Cardiologist:  None        Patient Profile:   Alexandra Freeman is a 27 y.o. female with no prior past medical history who is being seen 07/22/2022 for the evaluation of cardiac arrest at the request of Dr. Carlis Abbott.  History of Present Illness:   Alexandra Freeman is a 27 year old female with witnessed VF arrest while at home with both her mother as well as stepmother.  EMS was immediately called and when they arrived, ventricular fibrillation was noted, defibrillated.  Ventricular tachycardia was then noted, amiodarone bolused.  She returned to spontaneous circulation after approximately 11 minutes of CPR.  Currently she is comfortable in bed.  Breathing comfortably.  She was utilizing her smart phone previously.  Was able to answer questions.  Appeared somewhat somber.  She has had no prior syncopal episodes with or without exercise when asking her mother.  She was using marijuana.  No early family history of sudden cardiac death.   Past Medical History:  Diagnosis Date   Medical history non-contributory    Menorrhagia     Past Surgical History:  Procedure Laterality Date   NO PAST SURGERIES       Home Medications:  Prior to Admission medications   Medication Sig Start Date End Date Taking? Authorizing Provider  acetaminophen (TYLENOL) 500 MG tablet Take 500-1,000 mg by mouth every 6 (six) hours as needed for moderate pain.   Yes [provider]  ibuprofen (ADVIL) 800 MG tablet Take 1 tablet (800 mg total) by mouth every 8 (eight) hours as needed (pain). 08/30/21  Yes Barrett Henle, MD  terbinafine (LAMISIL) 250 MG tablet Take 250 mg by mouth daily.   Yes [provider]    Inpatient Medications: Scheduled Meds:  Chlorhexidine Gluconate Cloth  6  each Topical Daily   docusate  100 mg Per Tube BID   heparin injection (subcutaneous)  5,000 Units Subcutaneous Q8H   Continuous Infusions:  ampicillin-sulbactam (UNASYN) IV 3 g (07/22/22 1313)   lactated ringers Stopped (07/22/22 1218)   promethazine (PHENERGAN) injection (IM or IVPB) 12.5 mg (07/22/22 1214)   PRN Meds: acetaminophen, docusate sodium, ketorolac, ondansetron (ZOFRAN) IV, mouth rinse, polyethylene glycol, promethazine (PHENERGAN) injection (IM or IVPB) **OR** promethazine  Allergies:   No Known Allergies  Social History:   Social History   Socioeconomic History   Marital status: Single    Spouse name: Not on file   Number of children: Not on file   Years of education: Not on file   Highest education level: Not on file  Occupational History   Not on file  Tobacco Use   Smoking status: Never   Smokeless tobacco: Never  Vaping Use   Vaping Use: Never used  Substance and Sexual Activity   Alcohol use: No   Drug use: No   Sexual activity: Yes    Birth control/protection: None  Other Topics Concern   Not on file  Social History Narrative   Not on file   Social Determinants of Health   Financial Resource Strain: Not on file  Food Insecurity: No Food Insecurity (07/21/2022)   Hunger Vital Sign    Worried About Running Out of Food in the Last Year: Never true    Ran Out of Food in the Last Year:  Never true  Transportation Needs: No Transportation Needs (07/21/2022)   PRAPARE - Hydrologist (Medical): No    Lack of Transportation (Non-Medical): No  Physical Activity: Not on file  Stress: Not on file  Social Connections: Not on file  Intimate Partner Violence: Not At Risk (07/21/2022)   Humiliation, Afraid, Rape, and Kick questionnaire    Fear of Current or Ex-Partner: No    Emotionally Abused: No    Physically Abused: No    Sexually Abused: No    Family History:    Family History  Problem Relation Age of Onset    Polymyositis Mother      ROS:  Please see the history of present illness.   All other ROS reviewed and negative.     Physical Exam/Data:   Vitals:   07/22/22 1200 07/22/22 1300 07/22/22 1400 07/22/22 1500  BP: 135/77 (!) 121/56 (!) 121/56 132/72  Pulse: 78     Resp: (!) 28 14 (!) 6 (!) 8  Temp:      TempSrc:      SpO2: 100%     Weight:      Height:        Intake/Output Summary (Last 24 hours) at 07/22/2022 1520 Last data filed at 07/22/2022 1000 Gross per 24 hour  Intake 1302.45 ml  Output 375 ml  Net 927.45 ml      07/22/2022    4:00 AM 07/21/2022    5:00 AM 07/21/2022   12:54 AM  Last 3 Weights  Weight (lbs) 149 lb 7.6 oz 152 lb 5.4 oz 152 lb 5.4 oz  Weight (kg) 67.8 kg 69.1 kg 69.1 kg     Body mass index is 24.87 kg/m.  General:  Well nourished, well developed, in no acute distress HEENT: normal Neck: no JVD Vascular: No carotid bruits; Distal pulses 2+ bilaterally Cardiac:  normal S1, S2; RRR; no murmur  Lungs:  clear to auscultation bilaterally, no wheezing, rhonchi or rales  Abd: soft, nontender, no hepatomegaly  Ext: no edema Musculoskeletal:  No deformities, BUE and BLE strength normal and equal Skin: warm and dry  Neuro:  CNs 2-12 intact, no focal abnormalities noted Psych:  Somber affect, may be effect of mild encephalopathy.   EKG:  The EKG was personally reviewed and demonstrates: Sinus rhythm, borderline prolonged QT  Telemetry:  Telemetry was personally reviewed and demonstrates: Sinus bradycardia during sleep, sinus rhythm during wakeful hours.  No ectopy.  Relevant CV Studies:  ECHO 07/21/22:   1. Left ventricular ejection fraction, by estimation, is 50 to 55%. The  left ventricle has low normal function. The left ventricle has no regional  wall motion abnormalities. Left ventricular diastolic parameters were  normal.   2. Right ventricular systolic function is normal. The right ventricular  size is normal. There is mildly elevated pulmonary  artery systolic  pressure. The estimated right ventricular systolic pressure is [id number] mmHg.   3. The mitral valve is normal in structure. No evidence of mitral valve  regurgitation. No evidence of mitral stenosis.   4. The aortic valve is tricuspid. Aortic valve regurgitation is not  visualized. No aortic stenosis is present.   5. The inferior vena cava is dilated in size with <50% respiratory  variability, suggesting right atrial pressure of 15 mmHg.    Laboratory Data:  High Sensitivity Troponin:   Recent Labs  Lab 07/20/22 2234 07/21/22 0015  TROPONINIHS 107* 316*     Chemistry Recent Labs  Lab 07/21/22 0015 07/21/22 0749 07/21/22 1748 07/22/22 0453  NA 137 135  --  138  K 3.4* 4.3  --  4.4  CL 110 111  --  110  CO2 18* 19*  --  21*  GLUCOSE 171* 111*  --  108*  BUN 9 9  --  8  CREATININE 1.22* 0.79  --  0.77  CALCIUM 7.6* 8.0*  --  8.3*  MG 2.1 2.1 2.0 2.1  GFRNONAA >60 >60  --  >60  ANIONGAP 9 5  --  7    Recent Labs  Lab 07/20/22 2234 07/21/22 0015  PROT 7.0 6.8  ALBUMIN 3.7 3.8  AST 141* 145*  ALT 92* 88*  ALKPHOS 88 88  BILITOT 0.4 0.8   Lipids  Recent Labs  Lab 07/22/22 0453  TRIG 48    Hematology Recent Labs  Lab 07/20/22 2234 07/20/22 2315 07/21/22 0015 07/22/22 0453  WBC 10.3  --  21.6* 12.2*  RBC 4.02  --  4.18 3.53*  HGB 12.4  13.6 12.9 12.4 10.8*  HCT 37.4  40.0 38.0 39.8 32.9*  MCV 93.0  --  95.2 93.2  MCH 30.8  --  29.7 30.6  MCHC 33.2  --  31.2 32.8  RDW 14.6  --  14.6 14.7  PLT 217  --  257 147*   Thyroid No results for input(s): "TSH", "FREET4" in the last 168 hours.  BNPNo results for input(s): "BNP", "PROBNP" in the last 168 hours.  DDimer No results for input(s): "DDIMER" in the last 168 hours.   Radiology/Studies:  ECHOCARDIOGRAM COMPLETE  Result Date: 07/21/2022    ECHOCARDIOGRAM REPORT   Patient Name:   Alexandra Freeman Date of Exam: 07/21/2022 Medical Rec #:  [id number]            Height:       65.0 in  Accession #:    [id number]           Weight:       152.3 lb Date of Birth:  [birth year]             BSA:          1.762 m Patient Age:    26 years             BP:           110/68 mmHg Patient Gender: F                    HR:           60 bpm. Exam Location:  Inpatient Procedure: 2D Echo Indications:    cardiac arrest  History:        Patient has no prior history of Echocardiogram examinations.  Sonographer:    [place] Referring Phys: [id number] [place]  1. Left ventricular ejection fraction, by estimation, is 50 to 55%. The left ventricle has low normal function. The left ventricle has no regional wall motion abnormalities. Left ventricular diastolic parameters were normal.  2. Right ventricular systolic function is normal. The right ventricular size is normal. There is mildly elevated pulmonary artery systolic pressure. The estimated right ventricular systolic pressure is [id number] mmHg.  3. The mitral valve is normal in structure. No evidence of mitral valve regurgitation. No evidence of mitral stenosis.  4. The aortic valve is tricuspid. Aortic valve regurgitation is not visualized. No aortic stenosis is present.  5. The inferior vena cava is dilated  in size with <50% respiratory variability, suggesting right atrial pressure of 15 mmHg. FINDINGS  Left Ventricle: Left ventricular ejection fraction, by estimation, is 50 to 55%. The left ventricle has low normal function. The left ventricle has no regional wall motion abnormalities. The left ventricular internal cavity size was normal in size. There is no left ventricular hypertrophy. Left ventricular diastolic parameters were normal. Right Ventricle: The right ventricular size is normal. No increase in right ventricular wall thickness. Right ventricular systolic function is normal. There is mildly elevated pulmonary artery systolic pressure. The tricuspid regurgitant velocity is 2.55  m/s, and with an assumed right atrial pressure of 15 mmHg,  the estimated right ventricular systolic pressure is [id number] mmHg. Left Atrium: Left atrial size was normal in size. Right Atrium: Right atrial size was normal in size. Pericardium: Trivial pericardial effusion is present. Mitral Valve: The mitral valve is normal in structure. No evidence of mitral valve regurgitation. No evidence of mitral valve stenosis. Tricuspid Valve: The tricuspid valve is normal in structure. Tricuspid valve regurgitation is trivial. Aortic Valve: The aortic valve is tricuspid. Aortic valve regurgitation is not visualized. No aortic stenosis is present. Pulmonic Valve: The pulmonic valve was normal in structure. Pulmonic valve regurgitation is trivial. Aorta: The aortic root is normal in size and structure. Venous: The inferior vena cava is dilated in size with less than 50% respiratory variability, suggesting right atrial pressure of 15 mmHg. IAS/Shunts: No atrial level shunt detected by color flow Doppler.  LEFT VENTRICLE PLAX 2D LVIDd:         3.90 cm     Diastology LVIDs:         2.40 cm     LV e' medial:    11.50 cm/s LV PW:         0.90 cm     LV E/e' medial:  6.9 LV IVS:        0.80 cm     LV e' lateral:   14.10 cm/s LVOT diam:     2.30 cm     LV E/e' lateral: 5.6 LV SV:         59 LV SV Index:   34 LVOT Area:     4.15 cm  LV Volumes (MOD) LV vol d, MOD A4C: 68.8 ml LV vol s, MOD A4C: 36.6 ml LV SV MOD A4C:     68.8 ml RIGHT VENTRICLE             IVC RV Basal diam:  2.70 cm     IVC diam: 2.60 cm RV S prime:     13.10 cm/s TAPSE (M-mode): 2.6 cm LEFT ATRIUM           Index        RIGHT ATRIUM           Index LA diam:      2.90 cm 1.65 cm/m   RA Area:     10.70 cm LA Vol (A4C): 35.4 ml 20.09 ml/m  RA Volume:   22.40 ml  12.71 ml/m  AORTIC VALVE LVOT Vmax:   74.40 cm/s LVOT Vmean:  48.700 cm/s LVOT VTI:    0.143 m  AORTA Ao Root diam: 2.50 cm MITRAL VALVE               TRICUSPID VALVE MV Area (PHT): 2.80 cm    TR Peak grad:   26.0 mmHg MV Decel Time: 271 msec    TR Vmax:  255.00  cm/s MV E velocity: 79.20 cm/s MV A velocity: 50.70 cm/s  SHUNTS MV E/A ratio:  1.56        Systemic VTI:  0.14 m                            Systemic Diam: 2.30 cm Dalton McleanMD Electronically signed by Franki Monte Signature Date/Time: 07/21/2022/3:36:56 PM    Final    EEG adult  Result Date: 07/21/2022 Lora Havens, MD     07/21/2022 10:14 AM Patient Name: Alexandra Freeman MRN: [id number] Epilepsy Attending: Lora Havens Referring Physician/Provider: Margaretmary Lombard, MD Date: 07/21/2022 Duration: 22.59 mins Patient history: 27yo F s/p cardiac arrest. Level of alertness: Awake AEDs during EEG study: Propofol Technical aspects: This EEG study was done with scalp electrodes positioned according to the 10-20 International system of electrode placement. Electrical activity was reviewed with band pass filter of 1-70Hz$ , sensitivity of 7 uV/mm, display speed of 82m/sec with a 60Hz$  notched filter applied as appropriate. EEG data were recorded continuously and digitally stored.  Video monitoring was available and reviewed as appropriate. Description: The posterior dominant rhythm consists of 9 Hz activity of moderate voltage (25-35 uV) seen predominantly in posterior head regions, symmetric and reactive to eye opening and eye closing. EEG showed continuous generalized 3 to 6 Hz theta-delta slowing. Hyperventilation and photic stimulation were not performed.   ABNORMALITY - Continuous slow, generalized IMPRESSION: This study is suggestive of moderate diffuse encephalopathy, nonspecific etiology. No seizures or epileptiform discharges were seen throughout the recording. PLora Havens  CT Head Wo Contrast  Result Date: 07/21/2022 CLINICAL DATA:  Altered mental status EXAM: CT HEAD WITHOUT CONTRAST TECHNIQUE: Contiguous axial images were obtained from the base of the skull through the vertex without intravenous contrast. RADIATION DOSE REDUCTION: This exam was performed according to the departmental  dose-optimization program which includes automated exposure control, adjustment of the mA and/or kV according to patient size and/or use of iterative reconstruction technique. COMPARISON:  None Available. FINDINGS: Brain: No evidence of acute infarction, hemorrhage, hydrocephalus, extra-axial collection or mass lesion/mass effect. Vascular: No hyperdense vessel or unexpected calcification. Skull: Normal. Negative for fracture or focal lesion. Sinuses/Orbits: No acute finding. Other: None. IMPRESSION: No acute intracranial abnormality noted. Electronically Signed   By: MInez CatalinaM.D.   On: 07/21/2022 00:12   CT Angio Chest PE W and/or Wo Contrast  Result Date: 07/21/2022 CLINICAL DATA:  Difficulty breathing, status post intubation EXAM: CT ANGIOGRAPHY CHEST WITH CONTRAST TECHNIQUE: Multidetector CT imaging of the chest was performed using the standard protocol during bolus administration of intravenous contrast. Multiplanar CT image reconstructions and MIPs were obtained to evaluate the vascular anatomy. RADIATION DOSE REDUCTION: This exam was performed according to the departmental dose-optimization program which includes automated exposure control, adjustment of the mA and/or kV according to patient size and/or use of iterative reconstruction technique. CONTRAST:  727mOMNIPAQUE IOHEXOL 350 MG/ML SOLN COMPARISON:  Chest x-ray from earlier in the same day. FINDINGS: Cardiovascular: Thoracic aorta its branches are within normal limits. No aneurysmal dilatation or dissection is seen. No cardiac enlargement is noted. No filling defect to suggest pulmonary embolism is noted. Pulmonary artery shows a normal branching pattern. Mediastinum/Nodes: Thoracic inlet is within normal limits. Endotracheal tube and gastric catheter are noted in satisfactory position. Esophagus is within normal limits. No hilar or mediastinal adenopathy is noted. Lungs/Pleura: The lungs are well aerated bilaterally. No focal  infiltrate or  effusion is seen. Some inspissated material is noted in the right lower lobe bronchial tree. Mild basilar atelectasis is noted. Upper Abdomen: Visualized upper abdomen is within normal limits. Musculoskeletal: No acute rib abnormality is noted. No compression deformity is seen. Review of the MIP images confirms the above findings. IMPRESSION: No evidence of pulmonary emboli. Mild inspissated material in the right lower lobe bronchial tree likely related to mucous. Mild bibasilar atelectasis. Electronically Signed   By: Inez Catalina M.D.   On: 07/21/2022 00:11   DG Chest Portable 1 View  Result Date: 07/20/2022 CLINICAL DATA:  Post intubation. EXAM: PORTABLE CHEST 1 VIEW COMPARISON:  None Available. FINDINGS: The heart is normal in size. Lungs are clear without evidence of focal consolidation or pleural effusion no acute osseous abnormality. Endotracheal tube with distal tip approximately 4.5 cm above the carina. Right PICC with distal tip likely in the IVC. Feeding tube coursing below the diaphragm with distal tip not included. IMPRESSION: 1. Endotracheal tube with distal tip approximately 4.5 cm above the carina. 2. Right PICC with distal tip likely in the IVC. Electronically Signed   By: Keane Police D.O.   On: 07/20/2022 23:07     Assessment and Plan:   27 year old with V-fib cardiac arrest -Plan on cardiac catheterization to ensure normal coronary anatomy. -Echocardiogram unremarkable.  No significant increase in wall thickness. -Will obtain cardiac MRI  Explained diagnostic heart catheterization to her and her family members including her mother and stepmother in the room, risks and benefits explained, willing to proceed. I have written precath orders. -We will also discuss with our EP colleagues.  Agree that may need defibrillator prior to discharge.  Discussed with family as well. -Troponin elevation of 316 secondary to cardiac arrest. -She is also being treated for aspiration pneumonia.   She appears comfortable currently from a respiratory status.  For questions or updates, please contact [place] Please consult [email or web address] for contact info under    Signed, Candee Furbish, MD  07/22/2022 3:20 PM

## 2022-07-22 NOTE — Consult Note (Signed)
Cardiology Consultation   Patient ID: Alexandra Freeman MRN: [id number]; DOB: 1995-07-24  Admit date: 07/20/2022 Date of Consult: 07/22/2022  PCP:  Patient, No Pcp Per   [facility] Providers Cardiologist:  None    Patient Profile:   Alexandra Freeman is a 27 y.o. 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 who is being seen 07/22/2022 for the evaluation of ICD/cardiac arrest at the request of Dr. Marlou Porch.  History of Present Illness:   Ms. Javier admitted with cardiac arrest  EMS reviewed On their arrival [facility] on scene,(no bystander CPR per our record was performed, perhaps 6-7 minutes), bystanders reported they had all been smoking marijuana when the patient collapsed 21:16 EMS: Found pulseless and apneic CPR started on their arrival BVM > iGEL airway Initial rhythm is VF Defibrillated 360J IO achieved >  epi  Defibrillated 360J Amiodarone 300 Epi CPR stopped >>  21:24 cardioverted 100J (?VT) Final rhythm is SR 70's Fentanyl IV established Epinephrine gtt started Narcan given  Here: Airway exchanged for ETT. CXR showing ETT in good position.  CTA chest without PE.  CT head no acute abnormality.   No pressors here No AAD here  LABS K+ 3.4 > 3.5 > 3.4 > 4.3 > 4.4 Mag 2.3 >>>> 2.1 BUN/Creat 11/1.28 >> 0.77 Ca 8.3 > 7.6 > 8.0 > 8.3 AST 141 > 145 ALT 92 > 88 HS Trop 107 > 316 WBC 10.3 > 21.6 > > 12.2 H/H 12/37 >>> 10.8/32.9 Plts 217 > 147  Lactic acid 3.2 > 2.5   iSTAT hCG <5.0  pH 7.304 PO2 353 PCO2 33.2 Bicarb 16.8  Extubated today  EP is made aware of her, pending further cardiac evaluation, coronary eval, c.MRI, though felt she would require ICD.  HPI is aided by the patient's Mom, who was with her at the time of her collapse. She reports that the days prior she had been c/o an unusual back pain/ache, no fever/symptoms of illness, but tired. No cough/cold/congestion  The patient at baseline does smoke marijuana, nothing unusual,  excessive, or new source of supply. She was seated, laughing and collapsed  The patient is drowsy, c/o diffuse body pain, sore throat  No hx of syncope Mom has polymyositis,  no cardiac history by her or her family No sudden/unexplained deaths in her family She is less sure of the patient's father's side   Past Medical History:  Diagnosis Date   Medical history non-contributory    Menorrhagia     Past Surgical History:  Procedure Laterality Date   NO PAST SURGERIES       Home Medications:  Prior to Admission medications   Medication Sig Start Date End Date Taking? Authorizing Provider  acetaminophen (TYLENOL) 500 MG tablet Take 500-1,000 mg by mouth every 6 (six) hours as needed for moderate pain.   Yes [provider]  ibuprofen (ADVIL) 800 MG tablet Take 1 tablet (800 mg total) by mouth every 8 (eight) hours as needed (pain). 08/30/21  Yes Barrett Henle, MD  terbinafine (LAMISIL) 250 MG tablet Take 250 mg by mouth daily.   Yes [provider]    Inpatient Medications: Scheduled Meds:  Chlorhexidine Gluconate Cloth  6 each Topical Daily   docusate  100 mg Per Tube BID   heparin injection (subcutaneous)  5,000 Units Subcutaneous Q8H   Continuous Infusions:  ampicillin-sulbactam (UNASYN) IV 3 g (07/22/22 1313)   lactated ringers Stopped (07/22/22 1218)   promethazine (PHENERGAN) injection (IM or  IVPB) 12.5 mg (07/22/22 1214)   PRN Meds: acetaminophen, docusate sodium, ketorolac, ondansetron (ZOFRAN) IV, mouth rinse, polyethylene glycol, promethazine (PHENERGAN) injection (IM or IVPB) **OR** promethazine  Allergies:   No Known Allergies  Social History:   Social History   Socioeconomic History   Marital status: Single    Spouse name: Not on file   Number of children: Not on file   Years of education: Not on file   Highest education level: Not on file  Occupational History   Not on file  Tobacco Use   Smoking status: Never   Smokeless  tobacco: Never  Vaping Use   Vaping Use: Never used  Substance and Sexual Activity   Alcohol use: No   Drug use: No   Sexual activity: Yes    Birth control/protection: None  Other Topics Concern   Not on file  Social History Narrative   Not on file   Social Determinants of Health   Financial Resource Strain: Not on file  Food Insecurity: No Food Insecurity (07/21/2022)   Hunger Vital Sign    Worried About Running Out of Food in the Last Year: Never true    Ran Out of Food in the Last Year: Never true  Transportation Needs: No Transportation Needs (07/21/2022)   PRAPARE - Hydrologist (Medical): No    Lack of Transportation (Non-Medical): No  Physical Activity: Not on file  Stress: Not on file  Social Connections: Not on file  Intimate Partner Violence: Not At Risk (07/21/2022)   Humiliation, Afraid, Rape, and Kick questionnaire    Fear of Current or Ex-Partner: No    Emotionally Abused: No    Physically Abused: No    Sexually Abused: No    Family History:   Family History  Problem Relation Age of Onset   Polymyositis Mother      ROS:  Please see the history of present illness.  All other ROS reviewed and negative.     Physical Exam/Data:   Vitals:   07/22/22 1200 07/22/22 1300 07/22/22 1400 07/22/22 1500  BP: 135/77 (!) 121/56 (!) 121/56 132/72  Pulse: 78     Resp: (!) 28 14 (!) 6 (!) 8  Temp:      TempSrc:      SpO2: 100%     Weight:      Height:        Intake/Output Summary (Last 24 hours) at 07/22/2022 1540 Last data filed at 07/22/2022 1000 Gross per 24 hour  Intake 1302.45 ml  Output 375 ml  Net 927.45 ml      07/22/2022    4:00 AM 07/21/2022    5:00 AM 07/21/2022   12:54 AM  Last 3 Weights  Weight (lbs) 149 lb 7.6 oz 152 lb 5.4 oz 152 lb 5.4 oz  Weight (kg) 67.8 kg 69.1 kg 69.1 kg     Body mass index is 24.87 kg/m.  General:  Well nourished, well developed, in no acute distress HEENT: normal Neck: no  JVD Vascular: No carotid bruits Cardiac:  RRR; no murmurs, gallops or rubs Lungs:  CTA b/l, no wheezing, rhonchi or rales  Abd: soft, nontender Ext: no edema Musculoskeletal:  No deformities Skin: warm and dry  Neuro:  somewhat encephalopathic, though no gross focal motor abnormalities noted Psych:  Normal affect   EKG:  The EKG was personally reviewed and demonstrates:    SR 97bpm PR 14m, QRS 1081m QTc 49579mTelemetry:  Telemetry  was personally reviewed and demonstrates:   SR only  Relevant CV Studies:   Aug 09, 2022: TTE 1. Left ventricular ejection fraction, by estimation, is 50 to 55%. The  left ventricle has low normal function. The left ventricle has no regional  wall motion abnormalities. Left ventricular diastolic parameters were  normal.   2. Right ventricular systolic function is normal. The right ventricular  size is normal. There is mildly elevated pulmonary artery systolic  pressure. The estimated right ventricular systolic pressure is [id number] mmHg.   3. The mitral valve is normal in structure. No evidence of mitral valve  regurgitation. No evidence of mitral stenosis.   4. The aortic valve is tricuspid. Aortic valve regurgitation is not  visualized. No aortic stenosis is present.   5. The inferior vena cava is dilated in size with <50% respiratory  variability, suggesting right atrial pressure of 15 mmHg   Laboratory Data:  High Sensitivity Troponin:   Recent Labs  Lab 07/20/22 2234 August 09, 2022 0015  TROPONINIHS 107* 316*     Chemistry Recent Labs  Lab 08-09-2022 0015 August 09, 2022 0749 2022-08-09 1748 07/22/22 0453  NA 137 135  --  138  K 3.4* 4.3  --  4.4  CL 110 111  --  110  CO2 18* 19*  --  21*  GLUCOSE 171* 111*  --  108*  BUN 9 9  --  8  CREATININE 1.22* 0.79  --  0.77  CALCIUM 7.6* 8.0*  --  8.3*  MG 2.1 2.1 2.0 2.1  GFRNONAA >60 >60  --  >60  ANIONGAP 9 5  --  7    Recent Labs  Lab 07/20/22 2234 08/09/2022 0015  PROT 7.0 6.8  ALBUMIN 3.7 3.8   AST 141* 145*  ALT 92* 88*  ALKPHOS 88 88  BILITOT 0.4 0.8   Lipids  Recent Labs  Lab 07/22/22 0453  TRIG 48    Hematology Recent Labs  Lab 07/20/22 2234 07/20/22 2315 09-Aug-2022 0015 07/22/22 0453  WBC 10.3  --  21.6* 12.2*  RBC 4.02  --  4.18 3.53*  HGB 12.4  13.6 12.9 12.4 10.8*  HCT 37.4  40.0 38.0 39.8 32.9*  MCV 93.0  --  95.2 93.2  MCH 30.8  --  29.7 30.6  MCHC 33.2  --  31.2 32.8  RDW 14.6  --  14.6 14.7  PLT 217  --  257 147*   Thyroid No results for input(s): "TSH", "FREET4" in the last 168 hours.  BNPNo results for input(s): "BNP", "PROBNP" in the last 168 hours.  DDimer No results for input(s): "DDIMER" in the last 168 hours.   Radiology/Studies:  EEG adult Result Date: 08/09/2022 Lora Havens, MD     Aug 09, 2022 10:14 AM Patient Name: Bronnie Fanta MRN: [id number] Epilepsy Attending: Lora Havens Referring Physician/Provider: Margaretmary Lombard, MD Date: 2022-08-09 Duration: 22.59 mins Patient history: 27yo F s/p cardiac arrest. Level of alertness: Awake AEDs during EEG study: Propofol Technical aspects: This EEG study was done with scalp electrodes positioned according to the 10-20 International system of electrode placement. Electrical activity was reviewed with band pass filter of 1-70Hz$ , sensitivity of 7 uV/mm, display speed of 36m/sec with a 60Hz$  notched filter applied as appropriate. EEG data were recorded continuously and digitally stored.  Video monitoring was available and reviewed as appropriate. Description: The posterior dominant rhythm consists of 9 Hz activity of moderate voltage (25-35 uV) seen predominantly in posterior head regions, symmetric and reactive to eye  opening and eye closing. EEG showed continuous generalized 3 to 6 Hz theta-delta slowing. Hyperventilation and photic stimulation were not performed.   ABNORMALITY - Continuous slow, generalized IMPRESSION: This study is suggestive of moderate diffuse encephalopathy, nonspecific  etiology. No seizures or epileptiform discharges were seen throughout the recording. Lora Havens   CT Head Wo Contrast Result Date: 07/21/2022 CLINICAL DATA:  Altered mental status EXAM: CT HEAD WITHOUT CONTRAST TECHNIQUE: Contiguous axial images were obtained from the base of the skull through the vertex without intravenous contrast. RADIATION DOSE REDUCTION: This exam was performed according to the departmental dose-optimization program which includes automated exposure control, adjustment of the mA and/or kV according to patient size and/or use of iterative reconstruction technique. COMPARISON:  None Available. FINDINGS: Brain: No evidence of acute infarction, hemorrhage, hydrocephalus, extra-axial collection or mass lesion/mass effect. Vascular: No hyperdense vessel or unexpected calcification. Skull: Normal. Negative for fracture or focal lesion. Sinuses/Orbits: No acute finding. Other: None. IMPRESSION: No acute intracranial abnormality noted. Electronically Signed   By: Inez Catalina M.D.   On: 07/21/2022 00:12   CT Angio Chest PE W and/or Wo Contrast Result Date: 07/21/2022 CLINICAL DATA:  Difficulty breathing, status post intubation EXAM: CT ANGIOGRAPHY CHEST WITH CONTRAST TECHNIQUE: Multidetector CT imaging of the chest was performed using the standard protocol during bolus administration of intravenous contrast. Multiplanar CT image reconstructions and MIPs were obtained to evaluate the vascular anatomy. RADIATION DOSE REDUCTION: This exam was performed according to the departmental dose-optimization program which includes automated exposure control, adjustment of the mA and/or kV according to patient size and/or use of iterative reconstruction technique. CONTRAST:  82m OMNIPAQUE IOHEXOL 350 MG/ML SOLN COMPARISON:  Chest x-ray from earlier in the same day. FINDINGS: Cardiovascular: Thoracic aorta its branches are within normal limits. No aneurysmal dilatation or dissection is seen. No cardiac  enlargement is noted. No filling defect to suggest pulmonary embolism is noted. Pulmonary artery shows a normal branching pattern. Mediastinum/Nodes: Thoracic inlet is within normal limits. Endotracheal tube and gastric catheter are noted in satisfactory position. Esophagus is within normal limits. No hilar or mediastinal adenopathy is noted. Lungs/Pleura: The lungs are well aerated bilaterally. No focal infiltrate or effusion is seen. Some inspissated material is noted in the right lower lobe bronchial tree. Mild basilar atelectasis is noted. Upper Abdomen: Visualized upper abdomen is within normal limits. Musculoskeletal: No acute rib abnormality is noted. No compression deformity is seen. Review of the MIP images confirms the above findings. IMPRESSION: No evidence of pulmonary emboli. Mild inspissated material in the right lower lobe bronchial tree likely related to mucous. Mild bibasilar atelectasis. Electronically Signed   By: MInez CatalinaM.D.   On: 07/21/2022 00:11   DG Chest Portable 1 View Result Date: 07/20/2022 CLINICAL DATA:  Post intubation. EXAM: PORTABLE CHEST 1 VIEW COMPARISON:  None Available. FINDINGS: The heart is normal in size. Lungs are clear without evidence of focal consolidation or pleural effusion no acute osseous abnormality. Endotracheal tube with distal tip approximately 4.5 cm above the carina. Right PICC with distal tip likely in the IVC. Feeding tube coursing below the diaphragm with distal tip not included. IMPRESSION: 1. Endotracheal tube with distal tip approximately 4.5 cm above the carina. 2. Right PICC with distal tip likely in the IVC. Electronically Signed   By: IKeane PoliceD.O.   On: 07/20/2022 23:07     Assessment and Plan:   Cardiac arrest No historical cardia data TTE w/LVEF 50-55%, no WMA Planned for Coronary eval,  probably cath and c.MRI  I do not suspect a reversible cause and she will need ICD Perhaps looking to Thursday, likely S-ICD    For  questions or updates, please contact [facility] Please consult [email or web address] for contact info under    Signed, Baldwin Jamaica, PA-C  07/22/2022 3:40 PM

## 2022-07-22 NOTE — Consult Note (Addendum)
Cardiology Consultation   Patient ID: Alexandra Freeman MRN: [id number]; DOB: Jul 08, 1995  Admit date: 07/20/2022 Date of Consult: 07/22/2022  PCP:  Patient, No Pcp Per   Jay Providers Cardiologist:  None        Patient Profile:   Alexandra Freeman is a 27 y.o. female with no prior past medical history who is being seen 07/22/2022 for the evaluation of cardiac arrest at the request of Dr. Carlis Abbott.  History of Present Illness:   Alexandra Freeman is a 27 year old female with witnessed VF arrest while at home with both her mother as well as stepmother.  EMS was immediately called and when they arrived, ventricular fibrillation was noted, defibrillated.  Ventricular tachycardia was then noted, amiodarone bolused.  She returned to spontaneous circulation after approximately 11 minutes of CPR.  Currently she is comfortable in bed.  Breathing comfortably.  She was utilizing her smart phone previously.  Was able to answer questions.  Appeared somewhat somber.  She has had no prior syncopal episodes with or without exercise when asking her mother.  She was using marijuana.  No early family history of sudden cardiac death.   Past Medical History:  Diagnosis Date   Medical history non-contributory    Menorrhagia     Past Surgical History:  Procedure Laterality Date   NO PAST SURGERIES       Home Medications:  Prior to Admission medications   Medication Sig Start Date End Date Taking? Authorizing Provider  acetaminophen (TYLENOL) 500 MG tablet Take 500-1,000 mg by mouth every 6 (six) hours as needed for moderate pain.   Yes [provider]  ibuprofen (ADVIL) 800 MG tablet Take 1 tablet (800 mg total) by mouth every 8 (eight) hours as needed (pain). 08/30/21  Yes Barrett Henle, MD  terbinafine (LAMISIL) 250 MG tablet Take 250 mg by mouth daily.   Yes [provider]    Inpatient Medications: Scheduled Meds:  Chlorhexidine Gluconate Cloth  6  each Topical Daily   docusate  100 mg Per Tube BID   heparin injection (subcutaneous)  5,000 Units Subcutaneous Q8H   Continuous Infusions:  ampicillin-sulbactam (UNASYN) IV 3 g (07/22/22 1313)   lactated ringers Stopped (07/22/22 1218)   promethazine (PHENERGAN) injection (IM or IVPB) 12.5 mg (07/22/22 1214)   PRN Meds: acetaminophen, docusate sodium, ketorolac, ondansetron (ZOFRAN) IV, mouth rinse, polyethylene glycol, promethazine (PHENERGAN) injection (IM or IVPB) **OR** promethazine  Allergies:   No Known Allergies  Social History:   Social History   Socioeconomic History   Marital status: Single    Spouse name: Not on file   Number of children: Not on file   Years of education: Not on file   Highest education level: Not on file  Occupational History   Not on file  Tobacco Use   Smoking status: Never   Smokeless tobacco: Never  Vaping Use   Vaping Use: Never used  Substance and Sexual Activity   Alcohol use: No   Drug use: No   Sexual activity: Yes    Birth control/protection: None  Other Topics Concern   Not on file  Social History Narrative   Not on file   Social Determinants of Health   Financial Resource Strain: Not on file  Food Insecurity: No Food Insecurity (07/21/2022)   Hunger Vital Sign    Worried About Running Out of Food in the Last Year: Never true    Ran Out of Food in the Last Year:  Never true  Transportation Needs: No Transportation Needs (07/21/2022)   PRAPARE - Hydrologist (Medical): No    Lack of Transportation (Non-Medical): No  Physical Activity: Not on file  Stress: Not on file  Social Connections: Not on file  Intimate Partner Violence: Not At Risk (07/21/2022)   Humiliation, Afraid, Rape, and Kick questionnaire    Fear of Current or Ex-Partner: No    Emotionally Abused: No    Physically Abused: No    Sexually Abused: No    Family History:    Family History  Problem Relation Age of Onset    Polymyositis Mother      ROS:  Please see the history of present illness.   All other ROS reviewed and negative.     Physical Exam/Data:   Vitals:   07/22/22 1200 07/22/22 1300 07/22/22 1400 07/22/22 1500  BP: 135/77 (!) 121/56 (!) 121/56 132/72  Pulse: 78     Resp: (!) 28 14 (!) 6 (!) 8  Temp:      TempSrc:      SpO2: 100%     Weight:      Height:        Intake/Output Summary (Last 24 hours) at 07/22/2022 1520 Last data filed at 07/22/2022 1000 Gross per 24 hour  Intake 1302.45 ml  Output 375 ml  Net 927.45 ml      07/22/2022    4:00 AM 07/21/2022    5:00 AM 07/21/2022   12:54 AM  Last 3 Weights  Weight (lbs) 149 lb 7.6 oz 152 lb 5.4 oz 152 lb 5.4 oz  Weight (kg) 67.8 kg 69.1 kg 69.1 kg     Body mass index is 24.87 kg/m.  General:  Well nourished, well developed, in no acute distress HEENT: normal Neck: no JVD Vascular: No carotid bruits; Distal pulses 2+ bilaterally Cardiac:  normal S1, S2; RRR; no murmur  Lungs:  clear to auscultation bilaterally, no wheezing, rhonchi or rales  Abd: soft, nontender, no hepatomegaly  Ext: no edema Musculoskeletal:  No deformities, BUE and BLE strength normal and equal Skin: warm and dry  Neuro:  CNs 2-12 intact, no focal abnormalities noted Psych:  Somber affect, may be effect of mild encephalopathy.   EKG:  The EKG was personally reviewed and demonstrates: Sinus rhythm, borderline prolonged QT  Telemetry:  Telemetry was personally reviewed and demonstrates: Sinus bradycardia during sleep, sinus rhythm during wakeful hours.  No ectopy.  Relevant CV Studies:  ECHO 07/21/22:   1. Left ventricular ejection fraction, by estimation, is 50 to 55%. The  left ventricle has low normal function. The left ventricle has no regional  wall motion abnormalities. Left ventricular diastolic parameters were  normal.   2. Right ventricular systolic function is normal. The right ventricular  size is normal. There is mildly elevated pulmonary  artery systolic  pressure. The estimated right ventricular systolic pressure is [id number] mmHg.   3. The mitral valve is normal in structure. No evidence of mitral valve  regurgitation. No evidence of mitral stenosis.   4. The aortic valve is tricuspid. Aortic valve regurgitation is not  visualized. No aortic stenosis is present.   5. The inferior vena cava is dilated in size with <50% respiratory  variability, suggesting right atrial pressure of 15 mmHg.    Laboratory Data:  High Sensitivity Troponin:   Recent Labs  Lab 07/20/22 2234 07/21/22 0015  TROPONINIHS 107* 316*     Chemistry Recent Labs  Lab 07/21/22 0015 07/21/22 0749 07/21/22 1748 07/22/22 0453  NA 137 135  --  138  K 3.4* 4.3  --  4.4  CL 110 111  --  110  CO2 18* 19*  --  21*  GLUCOSE 171* 111*  --  108*  BUN 9 9  --  8  CREATININE 1.22* 0.79  --  0.77  CALCIUM 7.6* 8.0*  --  8.3*  MG 2.1 2.1 2.0 2.1  GFRNONAA >60 >60  --  >60  ANIONGAP 9 5  --  7    Recent Labs  Lab 07/20/22 2234 07/21/22 0015  PROT 7.0 6.8  ALBUMIN 3.7 3.8  AST 141* 145*  ALT 92* 88*  ALKPHOS 88 88  BILITOT 0.4 0.8   Lipids  Recent Labs  Lab 07/22/22 0453  TRIG 48    Hematology Recent Labs  Lab 07/20/22 2234 07/20/22 2315 07/21/22 0015 07/22/22 0453  WBC 10.3  --  21.6* 12.2*  RBC 4.02  --  4.18 3.53*  HGB 12.4  13.6 12.9 12.4 10.8*  HCT 37.4  40.0 38.0 39.8 32.9*  MCV 93.0  --  95.2 93.2  MCH 30.8  --  29.7 30.6  MCHC 33.2  --  31.2 32.8  RDW 14.6  --  14.6 14.7  PLT 217  --  257 147*   Thyroid No results for input(s): "TSH", "FREET4" in the last 168 hours.  BNPNo results for input(s): "BNP", "PROBNP" in the last 168 hours.  DDimer No results for input(s): "DDIMER" in the last 168 hours.   Radiology/Studies:  ECHOCARDIOGRAM COMPLETE  Result Date: 07/21/2022    ECHOCARDIOGRAM REPORT   Patient Name:   ANIESSA WORKMAN Rudzinski Date of Exam: 07/21/2022 Medical Rec #:  [id number]            Height:       65.0 in  Accession #:    [id number]           Weight:       152.3 lb Date of Birth:  10-28-1995             BSA:          1.762 m Patient Age:    26 years             BP:           110/68 mmHg Patient Gender: F                    HR:           60 bpm. Exam Location:  Inpatient Procedure: 2D Echo Indications:    cardiac arrest  History:        Patient has no prior history of Echocardiogram examinations.  Sonographer:    Keys Referring Phys: [id number] [place]  1. Left ventricular ejection fraction, by estimation, is 50 to 55%. The left ventricle has low normal function. The left ventricle has no regional wall motion abnormalities. Left ventricular diastolic parameters were normal.  2. Right ventricular systolic function is normal. The right ventricular size is normal. There is mildly elevated pulmonary artery systolic pressure. The estimated right ventricular systolic pressure is [id number] mmHg.  3. The mitral valve is normal in structure. No evidence of mitral valve regurgitation. No evidence of mitral stenosis.  4. The aortic valve is tricuspid. Aortic valve regurgitation is not visualized. No aortic stenosis is present.  5. The inferior vena cava is dilated  in size with <50% respiratory variability, suggesting right atrial pressure of 15 mmHg. FINDINGS  Left Ventricle: Left ventricular ejection fraction, by estimation, is 50 to 55%. The left ventricle has low normal function. The left ventricle has no regional wall motion abnormalities. The left ventricular internal cavity size was normal in size. There is no left ventricular hypertrophy. Left ventricular diastolic parameters were normal. Right Ventricle: The right ventricular size is normal. No increase in right ventricular wall thickness. Right ventricular systolic function is normal. There is mildly elevated pulmonary artery systolic pressure. The tricuspid regurgitant velocity is 2.55  m/s, and with an assumed right atrial pressure of 15 mmHg,  the estimated right ventricular systolic pressure is [id number] mmHg. Left Atrium: Left atrial size was normal in size. Right Atrium: Right atrial size was normal in size. Pericardium: Trivial pericardial effusion is present. Mitral Valve: The mitral valve is normal in structure. No evidence of mitral valve regurgitation. No evidence of mitral valve stenosis. Tricuspid Valve: The tricuspid valve is normal in structure. Tricuspid valve regurgitation is trivial. Aortic Valve: The aortic valve is tricuspid. Aortic valve regurgitation is not visualized. No aortic stenosis is present. Pulmonic Valve: The pulmonic valve was normal in structure. Pulmonic valve regurgitation is trivial. Aorta: The aortic root is normal in size and structure. Venous: The inferior vena cava is dilated in size with less than 50% respiratory variability, suggesting right atrial pressure of 15 mmHg. IAS/Shunts: No atrial level shunt detected by color flow Doppler.  LEFT VENTRICLE PLAX 2D LVIDd:         3.90 cm     Diastology LVIDs:         2.40 cm     LV e' medial:    11.50 cm/s LV PW:         0.90 cm     LV E/e' medial:  6.9 LV IVS:        0.80 cm     LV e' lateral:   14.10 cm/s LVOT diam:     2.30 cm     LV E/e' lateral: 5.6 LV SV:         59 LV SV Index:   34 LVOT Area:     4.15 cm  LV Volumes (MOD) LV vol d, MOD A4C: 68.8 ml LV vol s, MOD A4C: 36.6 ml LV SV MOD A4C:     68.8 ml RIGHT VENTRICLE             IVC RV Basal diam:  2.70 cm     IVC diam: 2.60 cm RV S prime:     13.10 cm/s TAPSE (M-mode): 2.6 cm LEFT ATRIUM           Index        RIGHT ATRIUM           Index LA diam:      2.90 cm 1.65 cm/m   RA Area:     10.70 cm LA Vol (A4C): 35.4 ml 20.09 ml/m  RA Volume:   22.40 ml  12.71 ml/m  AORTIC VALVE LVOT Vmax:   74.40 cm/s LVOT Vmean:  48.700 cm/s LVOT VTI:    0.143 m  AORTA Ao Root diam: 2.50 cm MITRAL VALVE               TRICUSPID VALVE MV Area (PHT): 2.80 cm    TR Peak grad:   26.0 mmHg MV Decel Time: 271 msec    TR Vmax:  255.00  cm/s MV E velocity: 79.20 cm/s MV A velocity: 50.70 cm/s  SHUNTS MV E/A ratio:  1.56        Systemic VTI:  0.14 m                            Systemic Diam: 2.30 cm Dalton McleanMD Electronically signed by Franki Monte Signature Date/Time: 07/21/2022/3:36:56 PM    Final    EEG adult  Result Date: 07/21/2022 Lora Havens, MD     07/21/2022 10:14 AM Patient Name: Alexandra Freeman MRN: [id number] Epilepsy Attending: Lora Havens Referring Physician/Provider: Margaretmary Lombard, MD Date: 07/21/2022 Duration: 22.59 mins Patient history: 27yo F s/p cardiac arrest. Level of alertness: Awake AEDs during EEG study: Propofol Technical aspects: This EEG study was done with scalp electrodes positioned according to the 10-20 International system of electrode placement. Electrical activity was reviewed with band pass filter of 1-70Hz$ , sensitivity of 7 uV/mm, display speed of 30m/sec with a 60Hz$  notched filter applied as appropriate. EEG data were recorded continuously and digitally stored.  Video monitoring was available and reviewed as appropriate. Description: The posterior dominant rhythm consists of 9 Hz activity of moderate voltage (25-35 uV) seen predominantly in posterior head regions, symmetric and reactive to eye opening and eye closing. EEG showed continuous generalized 3 to 6 Hz theta-delta slowing. Hyperventilation and photic stimulation were not performed.   ABNORMALITY - Continuous slow, generalized IMPRESSION: This study is suggestive of moderate diffuse encephalopathy, nonspecific etiology. No seizures or epileptiform discharges were seen throughout the recording. PLora Havens  CT Head Wo Contrast  Result Date: 07/21/2022 CLINICAL DATA:  Altered mental status EXAM: CT HEAD WITHOUT CONTRAST TECHNIQUE: Contiguous axial images were obtained from the base of the skull through the vertex without intravenous contrast. RADIATION DOSE REDUCTION: This exam was performed according to the departmental  dose-optimization program which includes automated exposure control, adjustment of the mA and/or kV according to patient size and/or use of iterative reconstruction technique. COMPARISON:  None Available. FINDINGS: Brain: No evidence of acute infarction, hemorrhage, hydrocephalus, extra-axial collection or mass lesion/mass effect. Vascular: No hyperdense vessel or unexpected calcification. Skull: Normal. Negative for fracture or focal lesion. Sinuses/Orbits: No acute finding. Other: None. IMPRESSION: No acute intracranial abnormality noted. Electronically Signed   By: MInez CatalinaM.D.   On: 07/21/2022 00:12   CT Angio Chest PE W and/or Wo Contrast  Result Date: 07/21/2022 CLINICAL DATA:  Difficulty breathing, status post intubation EXAM: CT ANGIOGRAPHY CHEST WITH CONTRAST TECHNIQUE: Multidetector CT imaging of the chest was performed using the standard protocol during bolus administration of intravenous contrast. Multiplanar CT image reconstructions and MIPs were obtained to evaluate the vascular anatomy. RADIATION DOSE REDUCTION: This exam was performed according to the departmental dose-optimization program which includes automated exposure control, adjustment of the mA and/or kV according to patient size and/or use of iterative reconstruction technique. CONTRAST:  795mOMNIPAQUE IOHEXOL 350 MG/ML SOLN COMPARISON:  Chest x-ray from earlier in the same day. FINDINGS: Cardiovascular: Thoracic aorta its branches are within normal limits. No aneurysmal dilatation or dissection is seen. No cardiac enlargement is noted. No filling defect to suggest pulmonary embolism is noted. Pulmonary artery shows a normal branching pattern. Mediastinum/Nodes: Thoracic inlet is within normal limits. Endotracheal tube and gastric catheter are noted in satisfactory position. Esophagus is within normal limits. No hilar or mediastinal adenopathy is noted. Lungs/Pleura: The lungs are well aerated bilaterally. No focal  infiltrate or  effusion is seen. Some inspissated material is noted in the right lower lobe bronchial tree. Mild basilar atelectasis is noted. Upper Abdomen: Visualized upper abdomen is within normal limits. Musculoskeletal: No acute rib abnormality is noted. No compression deformity is seen. Review of the MIP images confirms the above findings. IMPRESSION: No evidence of pulmonary emboli. Mild inspissated material in the right lower lobe bronchial tree likely related to mucous. Mild bibasilar atelectasis. Electronically Signed   By: Inez Catalina M.D.   On: 07/21/2022 00:11   DG Chest Portable 1 View  Result Date: 07/20/2022 CLINICAL DATA:  Post intubation. EXAM: PORTABLE CHEST 1 VIEW COMPARISON:  None Available. FINDINGS: The heart is normal in size. Lungs are clear without evidence of focal consolidation or pleural effusion no acute osseous abnormality. Endotracheal tube with distal tip approximately 4.5 cm above the carina. Right PICC with distal tip likely in the IVC. Feeding tube coursing below the diaphragm with distal tip not included. IMPRESSION: 1. Endotracheal tube with distal tip approximately 4.5 cm above the carina. 2. Right PICC with distal tip likely in the IVC. Electronically Signed   By: Keane Police D.O.   On: 07/20/2022 23:07     Assessment and Plan:   27 year old with V-fib cardiac arrest -Plan on cardiac catheterization to ensure normal coronary anatomy. -Echocardiogram unremarkable.  No significant increase in wall thickness. -Will obtain cardiac MRI  Explained diagnostic heart catheterization to her and her family members including her mother and stepmother in the room, risks and benefits explained, willing to proceed. I have written precath orders. -We will also discuss with our EP colleagues.  Agree that may need defibrillator prior to discharge.  Discussed with family as well. -Troponin elevation of 316 secondary to cardiac arrest. -She is also being treated for aspiration pneumonia.   She appears comfortable currently from a respiratory status.  For questions or updates, please contact [place] Please consult [email or web address] for contact info under    Signed, Candee Furbish, MD  07/22/2022 3:20 PM

## 2022-07-22 NOTE — Procedures (Signed)
Extubation Procedure Note  Patient Details:   Name: Zanea Airington DOB: 1995/07/20 MRN: [id number]   Airway Documentation:    Vent end date: 07/22/22 Vent end time: 0850   Evaluation  O2 sats: stable throughout Complications: No apparent complications Patient did tolerate procedure well. Bilateral Breath Sounds: Clear, Diminished   Yes  Patient extubated per order to 4L [place] with no complications. Positive cuff leak was noted prior to extubation. When RT entered room this AM patient was alert and able to follow commands so RT placed patient on wean 5/5. Patient tolerated wean well, but was very agitated and trying to come out of the bed. CCM MD called to bedside and agreed patient was ready to be extubated. Patient is alert and has strong cough. Vitals are stable. RT will continue to monitor.   Raudel Bazen Clyda Greener 07/22/2022, 9:24 AM

## 2022-07-23 ENCOUNTER — Ambulatory Visit (HOSPITAL_COMMUNITY): Admit: 2022-07-23 | Payer: Medicaid Other | Admitting: Cardiovascular Disease

## 2022-07-23 ENCOUNTER — Inpatient Hospital Stay (HOSPITAL_COMMUNITY): Payer: Medicaid Other

## 2022-07-23 ENCOUNTER — Encounter (HOSPITAL_COMMUNITY)
Admission: EM | Disposition: A | Discharge status: Home / Self Care | Payer: Self-pay | Source: Home / Self Care | Attending: Family Medicine

## 2022-07-23 DIAGNOSIS — E876 Hypokalemia: Secondary | ICD-10-CM | POA: Diagnosis not present

## 2022-07-23 DIAGNOSIS — I4901 Ventricular fibrillation: Secondary | ICD-10-CM

## 2022-07-23 DIAGNOSIS — R112 Nausea with vomiting, unspecified: Secondary | ICD-10-CM

## 2022-07-23 DIAGNOSIS — G9341 Metabolic encephalopathy: Secondary | ICD-10-CM | POA: Diagnosis not present

## 2022-07-23 DIAGNOSIS — I469 Cardiac arrest, cause unspecified: Secondary | ICD-10-CM | POA: Diagnosis not present

## 2022-07-23 HISTORY — PX: LEFT HEART CATH AND CORONARY ANGIOGRAPHY: CATH118249

## 2022-07-23 LAB — COMPREHENSIVE METABOLIC PANEL
ALT: 73 U/L — ABNORMAL HIGH (ref 0–44)
AST: 256 U/L — ABNORMAL HIGH (ref 15–41)
Albumin: 3.3 g/dL — ABNORMAL LOW (ref 3.5–5.0)
Alkaline Phosphatase: 68 U/L (ref 38–126)
Anion gap: 8 (ref 5–15)
BUN: 6 mg/dL (ref 6–20)
CO2: 23 mmol/L (ref 22–32)
Calcium: 8.1 mg/dL — ABNORMAL LOW (ref 8.9–10.3)
Chloride: 106 mmol/L (ref 98–111)
Creatinine, Ser: 0.91 mg/dL (ref 0.44–1.00)
GFR, Estimated: >60 mL/min (ref >60)
Glucose, Bld: 83 mg/dL (ref 70–99)
Potassium: 3.1 mmol/L — ABNORMAL LOW (ref 3.5–5.1)
Sodium: 137 mmol/L (ref 135–145)
Total Bilirubin: 0.5 mg/dL (ref 0.3–1.2)
Total Protein: 6.2 g/dL — ABNORMAL LOW (ref 6.5–8.1)

## 2022-07-23 LAB — PHOSPHORUS: Phosphorus: 2.5 mg/dL (ref 2.5–4.6)

## 2022-07-23 LAB — CBC
HCT: 33 % — ABNORMAL LOW (ref 36.0–46.0)
Hemoglobin: 10.8 g/dL — ABNORMAL LOW (ref 12.0–15.0)
MCH: 30.4 pg (ref 26.0–34.0)
MCHC: 32.7 g/dL (ref 30.0–36.0)
MCV: 93 fL (ref 80.0–100.0)
Platelets: 161 10*3/uL (ref 150–400)
RBC: 3.55 MIL/uL — ABNORMAL LOW (ref 3.87–5.11)
RDW: 14.5 % (ref 11.5–15.5)
WBC: 9.6 10*3/uL (ref 4.0–10.5)
nRBC: 0 % (ref 0.0–0.2)

## 2022-07-23 LAB — MAGNESIUM: Magnesium: 1.9 mg/dL (ref 1.7–2.4)

## 2022-07-23 SURGERY — LEFT HEART CATH AND CORONARY ANGIOGRAPHY
Anesthesia: LOCAL

## 2022-07-23 MED ORDER — MIDAZOLAM HCL 2 MG/2ML IJ SOLN
INTRAMUSCULAR | Status: AC
  Filled 2022-07-23: qty 2

## 2022-07-23 MED ORDER — HEPARIN (PORCINE) IN NACL 1000-0.9 UT/500ML-% IV SOLN
INTRAVENOUS | Status: DC | PRN
  Administered 2022-07-23 (×2): 500 mL

## 2022-07-23 MED ORDER — FENTANYL CITRATE (PF) 100 MCG/2ML IJ SOLN
INTRAMUSCULAR | Status: DC | PRN
  Administered 2022-07-23: 25 ug via INTRAVENOUS
  Administered 2022-07-23: 50 ug via INTRAVENOUS

## 2022-07-23 MED ORDER — SODIUM CHLORIDE 0.9 % IV SOLN
250.0000 mL | INTRAVENOUS | Status: DC | PRN
  Administered 2022-07-30: 250 mL via INTRAVENOUS

## 2022-07-23 MED ORDER — POTASSIUM CHLORIDE CRYS ER 20 MEQ PO TBCR
40.0000 meq | EXTENDED_RELEASE_TABLET | Freq: Once | ORAL | Status: AC
  Administered 2022-07-23: 40 meq via ORAL
  Filled 2022-07-23: qty 2

## 2022-07-23 MED ORDER — VERAPAMIL HCL 2.5 MG/ML IV SOLN
INTRAVENOUS | Status: AC
  Filled 2022-07-23: qty 2

## 2022-07-23 MED ORDER — SODIUM CHLORIDE 0.9 % IV SOLN: INTRAVENOUS | Status: AC

## 2022-07-23 MED ORDER — HEPARIN SODIUM (PORCINE) 1000 UNIT/ML IJ SOLN
INTRAMUSCULAR | Status: DC | PRN
  Administered 2022-07-23: 2000 [IU] via INTRAVENOUS

## 2022-07-23 MED ORDER — LIDOCAINE HCL (PF) 1 % IJ SOLN
INTRAMUSCULAR | Status: DC | PRN
  Administered 2022-07-23: 2 mL via INTRADERMAL

## 2022-07-23 MED ORDER — FENTANYL CITRATE (PF) 100 MCG/2ML IJ SOLN
INTRAMUSCULAR | Status: AC
  Filled 2022-07-23: qty 2

## 2022-07-23 MED ORDER — SODIUM CHLORIDE 0.9% FLUSH: 3.0000 mL | INTRAVENOUS | Status: DC | PRN

## 2022-07-23 MED ORDER — VERAPAMIL HCL 2.5 MG/ML IV SOLN
INTRAVENOUS | Status: DC | PRN
  Administered 2022-07-23: 10 mL via INTRA_ARTERIAL

## 2022-07-23 MED ORDER — LIDOCAINE 5 % EX PTCH
1.0000 | MEDICATED_PATCH | CUTANEOUS | Status: DC
  Administered 2022-07-23 – 2022-07-31 (×7): 1 via TRANSDERMAL
  Filled 2022-07-23: qty 2
  Filled 2022-07-23 (×8): qty 1

## 2022-07-23 MED ORDER — HEPARIN SODIUM (PORCINE) 1000 UNIT/ML IJ SOLN
INTRAMUSCULAR | Status: AC
  Filled 2022-07-23: qty 10

## 2022-07-23 MED ORDER — MELATONIN 3 MG PO TABS
3.0000 mg | ORAL_TABLET | Freq: Every evening | ORAL | Status: DC | PRN
  Administered 2022-07-24: 3 mg via ORAL
  Filled 2022-07-23: qty 1

## 2022-07-23 MED ORDER — SODIUM CHLORIDE 0.9% FLUSH
3.0000 mL | Freq: Two times a day (BID) | INTRAVENOUS | Status: DC
  Administered 2022-07-23 – 2022-07-31 (×14): 3 mL via INTRAVENOUS

## 2022-07-23 MED ORDER — IOHEXOL 350 MG/ML SOLN
INTRAVENOUS | Status: DC | PRN
  Administered 2022-07-23: 30 mL

## 2022-07-23 MED ORDER — MIDAZOLAM HCL 2 MG/2ML IJ SOLN
2.0000 mg | INTRAMUSCULAR | Status: AC | PRN
  Administered 2022-07-24 (×2): 2 mg via INTRAVENOUS
  Filled 2022-07-23 (×2): qty 2

## 2022-07-23 MED ORDER — LIDOCAINE HCL (PF) 1 % IJ SOLN
INTRAMUSCULAR | Status: AC
  Filled 2022-07-23: qty 30

## 2022-07-23 MED ORDER — POTASSIUM & SODIUM PHOSPHATES 280-160-250 MG PO PACK
2.0000 | PACK | Freq: Once | ORAL | Status: AC
  Administered 2022-07-23: 2 via ORAL
  Filled 2022-07-23: qty 2

## 2022-07-23 MED ORDER — MAGNESIUM SULFATE 2 GM/50ML IV SOLN
2.0000 g | Freq: Once | INTRAVENOUS | Status: AC
  Administered 2022-07-23: 2 g via INTRAVENOUS
  Filled 2022-07-23: qty 50

## 2022-07-23 MED ORDER — MIDAZOLAM HCL 2 MG/2ML IJ SOLN
INTRAMUSCULAR | Status: DC | PRN
  Administered 2022-07-23 (×2): 1 mg via INTRAVENOUS

## 2022-07-23 SURGICAL SUPPLY — 10 items
BAND ZEPHYR COMPRESS 30 LONG (HEMOSTASIS) IMPLANT
CATH INFINITI 5FR JK (CATHETERS) IMPLANT
GLIDESHEATH SLEND SS 6F .021 (SHEATH) IMPLANT
GUIDEWIRE INQWIRE 1.5J.035X260 (WIRE) IMPLANT
INQWIRE 1.5J .035X260CM (WIRE) ×1
KIT HEART LEFT (KITS) ×1 IMPLANT
PACK CARDIAC CATHETERIZATION (CUSTOM PROCEDURE TRAY) ×1 IMPLANT
SYR MEDRAD MARK 7 150ML (SYRINGE) ×1 IMPLANT
TRANSDUCER W/STOPCOCK (MISCELLANEOUS) ×1 IMPLANT
TUBING CIL FLEX 10 FLL-RA (TUBING) ×1 IMPLANT

## 2022-07-23 NOTE — Progress Notes (Signed)
D/w EP, hopefully we can get cMRI done early tomorrow morning. Versed x 2 doses PRN ordered for her cMRI tomorrow morning to help facilitate getting this completed.   Julian Hy, DO 07/23/22 4:45 PM [facility]  For contact information, see Amion. If no response to pager, please call PCCM consult pager. After hours, 7PM- 7AM, please call Elink.

## 2022-07-23 NOTE — Progress Notes (Signed)
eLink Physician-Brief Progress Note Patient Name: Beunka Malpass DOB: 1996-01-12 MRN: [id number]   Date of Service  07/23/2022  HPI/Events of Note  Patient with insomnia.  eICU Interventions  PRN Melatonin ordered.        Frederik Pear 07/23/2022, 11:09 PM

## 2022-07-23 NOTE — Progress Notes (Signed)
Agitated during MRI, not able to complete her scan. Threatening to leave despite family members at bedside trying to calm her down. Although she initially did not want to get her heart catherization, she agreed after we discussed why it was important. She does not appear to have capacity to make decisions at this point due to her encephalopathy. Her mother does not think she is thinking through things right now and agrees she is being impulsive. D/w Dr. Fletcher Anon, she will get mild sedation to be able to tolerate her catherization.   Letter for step mother for work placed in chart upstairs.    Julian Hy, DO 07/23/22 12:37 PM [facility]

## 2022-07-23 NOTE — Progress Notes (Signed)
Rounding Note    Patient Name: Alexandra Freeman Date of Encounter: 07/23/2022  [place] Cardiologist: None   Subjective   NAEO. Family at bedside.   Inpatient Medications    Scheduled Meds:  Chlorhexidine Gluconate Cloth  6 each Topical Daily   docusate  100 mg Per Tube BID   heparin injection (subcutaneous)  5,000 Units Subcutaneous Q8H   sodium chloride flush  3 mL Intravenous Q12H   Continuous Infusions:  sodium chloride     sodium chloride 1 mL/kg/hr (07/23/22 0556)   ampicillin-sulbactam (UNASYN) IV Stopped (07/22/22 2200)   lactated ringers 75 mL/hr at 07/23/22 0300   promethazine (PHENERGAN) injection (IM or IVPB) Stopped (07/22/22 1227)   PRN Meds: sodium chloride, acetaminophen, docusate sodium, ketorolac, ondansetron (ZOFRAN) IV, mouth rinse, oxyCODONE, polyethylene glycol, promethazine (PHENERGAN) injection (IM or IVPB) **OR** promethazine, sodium chloride flush   Vital Signs    Vitals:   07/23/22 0000 07/23/22 0100 07/23/22 0200 07/23/22 0300  BP: (!) 96/59 121/75 124/73 (!) 117/56  Pulse: 74 66 68 67  Resp: 15 15 10 16  $ Temp:    98.1 F (36.7 C)  TempSrc:    Oral  SpO2: 97% 99% 95% 97%  Weight:      Height:        Intake/Output Summary (Last 24 hours) at 07/23/2022 0603 Last data filed at 07/23/2022 0300 Gross per 24 hour  Intake 973.48 ml  Output 585 ml  Net 388.48 ml      07/22/2022    4:00 AM 07/21/2022    5:00 AM 07/21/2022   12:54 AM  Last 3 Weights  Weight (lbs) 149 lb 7.6 oz 152 lb 5.4 oz 152 lb 5.4 oz  Weight (kg) 67.8 kg 69.1 kg 69.1 kg      Telemetry    Sinus - Personally Reviewed  ECG    Personally Reviewed  Physical Exam   GEN: No acute distress.   Cardiac: RRR, no murmurs, rubs, or gallops.  Neuro:  Nonfocal.     Labs    High Sensitivity Troponin:   Recent Labs  Lab 07/20/22 2234 07/21/22 0015  TROPONINIHS 107* 316*     Chemistry Recent Labs  Lab 07/20/22 2234 07/20/22 2315  07/21/22 0015 07/21/22 0749 07/21/22 1748 07/22/22 0453 07/22/22 1632  NA 137  142   < > 137 135  --  138  --   K 3.4*  3.4*   < > 3.4* 4.3  --  4.4  --   CL 108  --  110 111  --  110  --   CO2 18*  --  18* 19*  --  21*  --   GLUCOSE 185*  --  171* 111*  --  108*  --   BUN 11  --  9 9  --  8  --   CREATININE 1.28*  --  1.22* 0.79  --  0.77  --   CALCIUM 8.3*  --  7.6* 8.0*  --  8.3*  --   MG 2.3  --  2.1 2.1 2.0 2.1 2.0  PROT 7.0  --  6.8  --   --   --   --   ALBUMIN 3.7  --  3.8  --   --   --   --   AST 141*  --  145*  --   --   --   --   ALT 92*  --  88*  --   --   --   --  ALKPHOS 88  --  88  --   --   --   --   BILITOT 0.4  --  0.8  --   --   --   --   GFRNONAA 59*  --  >60 >60  --  >60  --   ANIONGAP 11  --  9 5  --  7  --    < > = values in this interval not displayed.    Lipids  Recent Labs  Lab 07/22/22 0453  TRIG 48    Hematology Recent Labs  Lab 07/20/22 2234 07/20/22 2315 07/21/22 0015 07/22/22 0453  WBC 10.3  --  21.6* 12.2*  RBC 4.02  --  4.18 3.53*  HGB 12.4  13.6 12.9 12.4 10.8*  HCT 37.4  40.0 38.0 39.8 32.9*  MCV 93.0  --  95.2 93.2  MCH 30.8  --  29.7 30.6  MCHC 33.2  --  31.2 32.8  RDW 14.6  --  14.6 14.7  PLT 217  --  257 147*   Thyroid No results for input(s): "TSH", "FREET4" in the last 168 hours.  BNPNo results for input(s): "BNP", "PROBNP" in the last 168 hours.  DDimer No results for input(s): "DDIMER" in the last 168 hours.   Radiology    ECHOCARDIOGRAM COMPLETE  Result Date: 07/21/2022    ECHOCARDIOGRAM REPORT   Patient Name:   Alexandra Freeman Date of Exam: 07/21/2022 Medical Rec #:  [id number]            Height:       65.0 in Accession #:    [id number]           Weight:       152.3 lb Date of Birth:  1995-09-12             BSA:          1.762 m Patient Age:    26 years             BP:           110/68 mmHg Patient Gender: F                    HR:           60 bpm. Exam Location:  Inpatient Procedure: 2D Echo Indications:     cardiac arrest  History:        Patient has no prior history of Echocardiogram examinations.  Sonographer:    [place] Referring Phys: [id number] [place]  1. Left ventricular ejection fraction, by estimation, is 50 to 55%. The left ventricle has low normal function. The left ventricle has no regional wall motion abnormalities. Left ventricular diastolic parameters were normal.  2. Right ventricular systolic function is normal. The right ventricular size is normal. There is mildly elevated pulmonary artery systolic pressure. The estimated right ventricular systolic pressure is [id number] mmHg.  3. The mitral valve is normal in structure. No evidence of mitral valve regurgitation. No evidence of mitral stenosis.  4. The aortic valve is tricuspid. Aortic valve regurgitation is not visualized. No aortic stenosis is present.  5. The inferior vena cava is dilated in size with <50% respiratory variability, suggesting right atrial pressure of 15 mmHg. FINDINGS  Left Ventricle: Left ventricular ejection fraction, by estimation, is 50 to 55%. The left ventricle has low normal function. The left ventricle has no regional wall motion abnormalities. The left ventricular internal cavity size  was normal in size. There is no left ventricular hypertrophy. Left ventricular diastolic parameters were normal. Right Ventricle: The right ventricular size is normal. No increase in right ventricular wall thickness. Right ventricular systolic function is normal. There is mildly elevated pulmonary artery systolic pressure. The tricuspid regurgitant velocity is 2.55  m/s, and with an assumed right atrial pressure of 15 mmHg, the estimated right ventricular systolic pressure is [id number] mmHg. Left Atrium: Left atrial size was normal in size. Right Atrium: Right atrial size was normal in size. Pericardium: Trivial pericardial effusion is present. Mitral Valve: The mitral valve is normal in structure. No evidence of mitral valve  regurgitation. No evidence of mitral valve stenosis. Tricuspid Valve: The tricuspid valve is normal in structure. Tricuspid valve regurgitation is trivial. Aortic Valve: The aortic valve is tricuspid. Aortic valve regurgitation is not visualized. No aortic stenosis is present. Pulmonic Valve: The pulmonic valve was normal in structure. Pulmonic valve regurgitation is trivial. Aorta: The aortic root is normal in size and structure. Venous: The inferior vena cava is dilated in size with less than 50% respiratory variability, suggesting right atrial pressure of 15 mmHg. IAS/Shunts: No atrial level shunt detected by color flow Doppler.  LEFT VENTRICLE PLAX 2D LVIDd:         3.90 cm     Diastology LVIDs:         2.40 cm     LV e' medial:    11.50 cm/s LV PW:         0.90 cm     LV E/e' medial:  6.9 LV IVS:        0.80 cm     LV e' lateral:   14.10 cm/s LVOT diam:     2.30 cm     LV E/e' lateral: 5.6 LV SV:         59 LV SV Index:   34 LVOT Area:     4.15 cm  LV Volumes (MOD) LV vol d, MOD A4C: 68.8 ml LV vol s, MOD A4C: 36.6 ml LV SV MOD A4C:     68.8 ml RIGHT VENTRICLE             IVC RV Basal diam:  2.70 cm     IVC diam: 2.60 cm RV S prime:     13.10 cm/s TAPSE (M-mode): 2.6 cm LEFT ATRIUM           Index        RIGHT ATRIUM           Index LA diam:      2.90 cm 1.65 cm/m   RA Area:     10.70 cm LA Vol (A4C): 35.4 ml 20.09 ml/m  RA Volume:   22.40 ml  12.71 ml/m  AORTIC VALVE LVOT Vmax:   74.40 cm/s LVOT Vmean:  48.700 cm/s LVOT VTI:    0.143 m  AORTA Ao Root diam: 2.50 cm MITRAL VALVE               TRICUSPID VALVE MV Area (PHT): 2.80 cm    TR Peak grad:   26.0 mmHg MV Decel Time: 271 msec    TR Vmax:        255.00 cm/s MV E velocity: 79.20 cm/s MV A velocity: 50.70 cm/s  SHUNTS MV E/A ratio:  1.56        Systemic VTI:  0.14 m  Systemic Diam: 2.30 cm Dalton McleanMD Electronically signed by Franki Monte Signature Date/Time: 07/21/2022/3:36:56 PM    Final    EEG adult  Result Date:  07/21/2022 Lora Havens, MD     07/21/2022 10:14 AM Patient Name: Alexandra Freeman MRN: [id number] Epilepsy Attending: Lora Havens Referring Physician/Provider: Margaretmary Lombard, MD Date: 07/21/2022 Duration: 22.59 mins Patient history: 27yo F s/p cardiac arrest. Level of alertness: Awake AEDs during EEG study: Propofol Technical aspects: This EEG study was done with scalp electrodes positioned according to the 10-20 International system of electrode placement. Electrical activity was reviewed with band pass filter of 1-70Hz$ , sensitivity of 7 uV/mm, display speed of 31m/sec with a 60Hz$  notched filter applied as appropriate. EEG data were recorded continuously and digitally stored.  Video monitoring was available and reviewed as appropriate. Description: The posterior dominant rhythm consists of 9 Hz activity of moderate voltage (25-35 uV) seen predominantly in posterior head regions, symmetric and reactive to eye opening and eye closing. EEG showed continuous generalized 3 to 6 Hz theta-delta slowing. Hyperventilation and photic stimulation were not performed.   ABNORMALITY - Continuous slow, generalized IMPRESSION: This study is suggestive of moderate diffuse encephalopathy, nonspecific etiology. No seizures or epileptiform discharges were seen throughout the recording. PLora Havens     Assessment & Plan   Ms. DKenkelis a 27year old woman who I am seeing today for an evaluation of out of hospital cardiac arrest at the request of Dr. SMarlou Porch The patient has no significant past medical history. No prior syncopal history. She was at home with family when she suffered a witnessed cardiac arrest.   #Out of hospital cardiac arrest #Ventricular fibrillation Unclear cause.  No family history.  No personal history of syncope.  Seemingly a structurally normal heart. Left heart cath planned for today Needs cardiac MRI either today or tomorrow She will need outpatient genetics evaluation Avoid QT  prolonging medications.  We will need to monitor her QTc prior to discharge to see if it normalizes. Keep K greater than 4 and magnesium greater than 2   She will require an ICD prior to discharge.  Given her young age and lack of pacing needs, plan for a subcutaneous ICD.  Tentatively looking at Thursday for implant.    #Confusion/Abnormal Behavior Suspect this is 2/2 anoxia. Workup per primary team.   For questions or updates, please contact CLos OlivosPlease consult [email or web address] for contact info under        Signed, CVickie Epley MD  07/23/2022, 6:03 AM

## 2022-07-23 NOTE — Progress Notes (Addendum)
NAME:  Alexandra Freeman, MRN:  [id number], DOB:  1996/02/20, LOS: 2 ADMISSION DATE:  07/20/2022, CONSULTATION DATE:  2/18 REFERRING MD:  Dr. Langston Masker, CHIEF COMPLAINT:  cardiac arrest   History of Present Illness:  Patient is a 27 year old female pertinent PMH menorrhagia on birth control presents to [facility] ED on 2/18 cardiac arrest.  Per EMS, patient was at home-- had smoking weed with family earlier that night (pt and fam smoked, not just pt) and then was sitting on couch laughing w moms when suddenly pt slumped over. 1 mom checked pulse and was pulseless, started chest compressions. Other mom called EMS. Down for approximately 6 to 7 minutes with no CPR prior to EMS arrival.  Patient found to be in V-fib arrest, defibrillated, and received 11 minutes of CPR until ROSC. Patient post ROSC in vtach was then cardioverted and given amio with sinus rhythm. Patient transported to [facility].  In route to [facility], patient biting at igel supraglottic airway and was given 50 mics of fentanyl.  Upon arrival to [facility] ED, patient vitals stable moving extremities.  Temp 95.1 F.  Airway exchanged for ETT. CXR showing ETT in good position. CTA chest without PE. CT head no acute abnormality. EKG qtc 495, no ST elevations. ABG 7.24, 41, 191, 18.  Potassium 3.4, glucose 185, creatinine 1.28, calcium 8.3.  Troponin 107.  Ethanol WNL.  UDS positive for THC.  UA unremarkable.  PCCM consulted for ICU admission.  Pertinent  Medical History   Past Medical History:  Diagnosis Date   Medical history non-contributory    Menorrhagia      Significant Hospital Events: Including procedures, antibiotic start and stop dates in addition to other pertinent events   2/18 admitted post cardiac arrest 2/19 EEG, ECHO with LVEF 50-55% no wall motion abnormalities 2/20 extubated, emesis. Cards / EP consult   2/21 transfer out of ICU. Looks like going for [facility]    Interim History / Subjective:   NAEO  Chest feels sore   Mom at bedside    Objective   Blood pressure 134/80, pulse 71, temperature 98.1 F (36.7 C), temperature source Oral, resp. rate 12, height 5' 5"$  (1.651 m), weight 74.4 kg, SpO2 100 %, not currently breastfeeding.        Intake/Output Summary (Last 24 hours) at 07/23/2022 0910 Last data filed at 07/23/2022 0800 Gross per 24 hour  Intake 1435.2 ml  Output 650 ml  Net 785.2 ml   Filed Weights   07/21/22 0500 07/22/22 0400 07/23/22 [id number]  Weight: 69.1 kg 67.8 kg 74.4 kg    Examination: General:   WDWN adult F NAD  HEENT: NCAT pink mm  Neuro: Awake, oriented x 1-2 following commands  CV: rrr to brady, reg. S1s2 no rgm  PULM: even unlabored on RA  GI: soft ndnt  Extremities:  no acute joint deformity no cyanosis or clubbing  Skin: c/d/w no rash    Resolved Hospital Problem list    AKI  Lactic acidosis Acute respiratory failure w hypoxia Thrombocytopenia  Leukopytosis  Assessment & Plan:    Witnessed VF arrest  -did get immediate bystander CPR  -so about 17-18 min downtime total -etiology is unclear. Dont really think this would be drug related, wasn't the only one smoking so seems unlikely to be laced weed problem since others are ok -no acute intracranial process -no fam hx of sudden cardiac death  P -Cards and EP following -- plan for AICD 2/22  -Looks like [place] 2/21  -  supportive care  -lytes as below   Acute encephalopathy  -meds, ICU delirium, probably some component of anoxic injury  P -delirium precautions -PT OT SLP -clinically, dont think add'l imaging such as MRI will change our management at this point. However with plan for AICD this week, will d/w team and family as now would be the window to obtain    Possible aspiration  P -pulm hygiene, IS mobility -unasyn,3 d course   Transaminitis   -most likely in setting of arrest P -follow PRN  Elevated trops  -due to arrest P -PRN EKG, PRN trops   N/v  -cont IVF  -replace lytes as needed  -adv diet as  tolerated   Anemia, stable -PRN CBC  Hypophosphatemia Hypokalemia  P -replace -Cont to trend    Best Practice (right click and "Reselect all SmartList Selections" daily)   Diet/type: Regular consistency (see orders) DVT prophylaxis: prophylactic heparin  GI prophylaxis: N/A Lines: N/A Foley:  N/A Code Status:  full code Last date of multidisciplinary goals of care discussion [mom at bedside 2/21  Dispo: Stable to transfer out of ICU 2/21  Labs   CBC: Recent Labs  Lab 07/20/22 2234 07/20/22 2315 07/21/22 0015 07/22/22 0453 07/23/22 0621  WBC 10.3  --  21.6* 12.2* 9.6  NEUTROABS 6.7  --   --   --   --   HGB 12.4  13.6 12.9 12.4 10.8* 10.8*  HCT 37.4  40.0 38.0 39.8 32.9* 33.0*  MCV 93.0  --  95.2 93.2 93.0  PLT 217  --  257 147* Q000111Q    Basic Metabolic Panel: Recent Labs  Lab 07/20/22 2234 07/20/22 2315 07/21/22 0015 07/21/22 0749 07/21/22 1748 07/22/22 0453 07/22/22 1632 07/23/22 0621  NA 137  142 141 137 135  --  138  --  137  K 3.4*  3.4* 3.5 3.4* 4.3  --  4.4  --  3.1*  CL 108  --  110 111  --  110  --  106  CO2 18*  --  18* 19*  --  21*  --  23  GLUCOSE 185*  --  171* 111*  --  108*  --  83  BUN 11  --  9 9  --  8  --  6  CREATININE 1.28*  --  1.22* 0.79  --  0.77  --  0.91  CALCIUM 8.3*  --  7.6* 8.0*  --  8.3*  --  8.1*  MG 2.3  --  2.1 2.1 2.0 2.1 2.0 1.9  PHOS  --   --   --   --  2.8 3.1 2.1* 2.5   GFR: Estimated Creatinine Clearance: 93.8 mL/min (by C-G formula based on SCr of 0.91 mg/dL). Recent Labs  Lab 07/20/22 2234 07/21/22 0015 07/21/22 0144 07/22/22 0453 07/23/22 0621  WBC 10.3 21.6*  --  12.2* 9.6  LATICACIDVEN  --  3.2* 2.5*  --   --     Liver Function Tests: Recent Labs  Lab 07/20/22 2234 07/21/22 0015 07/23/22 0621  AST 141* 145* 256*  ALT 92* 88* 73*  ALKPHOS 88 88 68  BILITOT 0.4 0.8 0.5  PROT 7.0 6.8 6.2*  ALBUMIN 3.7 3.8 3.3*   No results for input(s): "LIPASE", "AMYLASE" in the last 168 hours. No  results for input(s): "AMMONIA" in the last 168 hours.  ABG    Component Value Date/Time   PHART 7.304 (L) 07/20/2022 2315   PCO2ART 33.2 07/20/2022 2315  PO2ART 353 (H) 07/20/2022 2315   HCO3 16.8 (L) 07/20/2022 2315   TCO2 18 (L) 07/20/2022 2315   ACIDBASEDEF 9.0 (H) 07/20/2022 2315   O2SAT 100 07/20/2022 2315     Coagulation Profile: No results for input(s): "INR", "PROTIME" in the last 168 hours.  Cardiac Enzymes: No results for input(s): "CKTOTAL", "CKMB", "CKMBINDEX", "TROPONINI" in the last 168 hours.  HbA1C: Hgb A1c MFr Bld  Date/Time Value Ref Range Status  07/20/2022 10:34 PM 4.6 (L) 4.8 - 5.6 % Final    Comment:    (NOTE) Pre diabetes:          5.7%-6.4%  Diabetes:              >6.4%  Glycemic control for   <7.0% adults with diabetes     CBG: Recent Labs  Lab 07/21/22 1945 07/21/22 2326 07/22/22 0411 07/22/22 0800 07/22/22 1141  GLUCAP 106* 115* 104* 110* 108*   CCT: n/a   Eliseo Gum MSN, AGACNP-BC [place] for pager  07/23/2022, 9:10 AM

## 2022-07-23 NOTE — Interval H&P Note (Signed)
History and Physical Interval Note:  07/23/2022 3:41 PM  [facility]  has presented today for surgery, with the diagnosis of cardiac arrest.  The various methods of treatment have been discussed with the patient and family. After consideration of risks, benefits and other options for treatment, the patient has consented to  Procedure(s): LEFT HEART CATH AND CORONARY ANGIOGRAPHY (N/A) as a surgical intervention.  The patient's history has been reviewed, patient examined, no change in status, stable for surgery.  I have reviewed the patient's chart and labs.  Questions were answered to the patient's satisfaction.     Kathlyn [place]

## 2022-07-24 ENCOUNTER — Inpatient Hospital Stay (HOSPITAL_COMMUNITY): Payer: Medicaid Other

## 2022-07-24 ENCOUNTER — Encounter (HOSPITAL_COMMUNITY)
Admission: EM | Disposition: A | Discharge status: Home / Self Care | Payer: Self-pay | Source: Home / Self Care | Attending: Family Medicine

## 2022-07-24 ENCOUNTER — Encounter (HOSPITAL_COMMUNITY): Payer: Self-pay | Admitting: Cardiovascular Disease

## 2022-07-24 DIAGNOSIS — I469 Cardiac arrest, cause unspecified: Secondary | ICD-10-CM | POA: Diagnosis not present

## 2022-07-24 DIAGNOSIS — I4901 Ventricular fibrillation: Secondary | ICD-10-CM | POA: Diagnosis not present

## 2022-07-24 DIAGNOSIS — G9341 Metabolic encephalopathy: Secondary | ICD-10-CM | POA: Diagnosis not present

## 2022-07-24 DIAGNOSIS — E876 Hypokalemia: Secondary | ICD-10-CM | POA: Diagnosis not present

## 2022-07-24 LAB — COMPREHENSIVE METABOLIC PANEL
ALT: 65 U/L — ABNORMAL HIGH (ref 0–44)
AST: 187 U/L — ABNORMAL HIGH (ref 15–41)
Albumin: 3.2 g/dL — ABNORMAL LOW (ref 3.5–5.0)
Alkaline Phosphatase: 62 U/L (ref 38–126)
Anion gap: 8 (ref 5–15)
BUN: 5 mg/dL — ABNORMAL LOW (ref 6–20)
CO2: 21 mmol/L — ABNORMAL LOW (ref 22–32)
Calcium: 8.3 mg/dL — ABNORMAL LOW (ref 8.9–10.3)
Chloride: 110 mmol/L (ref 98–111)
Creatinine, Ser: 0.87 mg/dL (ref 0.44–1.00)
GFR, Estimated: >60 mL/min (ref >60)
Glucose, Bld: 82 mg/dL (ref 70–99)
Potassium: 3.7 mmol/L (ref 3.5–5.1)
Sodium: 139 mmol/L (ref 135–145)
Total Bilirubin: 0.7 mg/dL (ref 0.3–1.2)
Total Protein: 6.1 g/dL — ABNORMAL LOW (ref 6.5–8.1)

## 2022-07-24 SURGERY — SUBQ ICD IMPLANT
Anesthesia: General

## 2022-07-24 MED ORDER — DOCUSATE SODIUM 100 MG PO CAPS
100.0000 mg | ORAL_CAPSULE | Freq: Two times a day (BID) | ORAL | Status: DC
  Administered 2022-07-24 – 2022-07-31 (×8): 100 mg via ORAL
  Filled 2022-07-24 (×15): qty 1

## 2022-07-24 MED ORDER — POTASSIUM CHLORIDE CRYS ER 20 MEQ PO TBCR
30.0000 meq | EXTENDED_RELEASE_TABLET | Freq: Once | ORAL | Status: AC
  Administered 2022-07-24: 30 meq via ORAL
  Filled 2022-07-24: qty 1

## 2022-07-24 MED ORDER — MELATONIN 5 MG PO TABS
5.0000 mg | ORAL_TABLET | Freq: Every evening | ORAL | Status: AC | PRN
  Administered 2022-07-24: 5 mg via ORAL
  Filled 2022-07-24 (×2): qty 1

## 2022-07-24 MED ORDER — DICLOFENAC SODIUM 1 % EX GEL
2.0000 g | Freq: Four times a day (QID) | CUTANEOUS | Status: DC
  Administered 2022-07-24 – 2022-07-31 (×10): 2 g via TOPICAL
  Filled 2022-07-24: qty 100

## 2022-07-24 NOTE — Progress Notes (Addendum)
Patient taken for cMRI at 1245hrs.  Oriented but forgetful.  Versed 54m IV given prior to starting MRI. Patient unable to hold still once MRI started despite repeated prompting.  Second dose of versed 223mgiven IV per prn order.  Patient continued to move despite repeated reminders to hold still and noted to be more active after second dose of versed.  Test unable to be done.  PA R. UrCharlcie Cradleext paged that test not done.

## 2022-07-24 NOTE — TOC Initial Note (Signed)
Transition of Care [facility]) - Initial/Assessment Note    Patient Details  Name: Alexandra Freeman MRN: [id number] Date of Birth: Jan 01, 1996  Transition of Care [facility]) CM/SW Contact:    Bethena Roys, RN Phone Number: 07/24/2022, 3:31 PM  Clinical Narrative:  Patient presented for cardiac arrest. PTA patient was from home. Plan was for ICD today; however, per notes will need time to regain mental status clarity. PT/OT will consult with the patient for transition of care needs. Case Manager will continue to follow for disposition needs as the patient progresses.                Expected Discharge Plan:  (TBD) Barriers to Discharge: Continued Medical Work up Patient Goals and CMS Choice     Choice offered to / list presented to : NA    Expected Discharge Plan and Services In-house Referral: NA Discharge Planning Services: CM Consult   Living arrangements for the past 2 months: Single Family Home                   DME Agency: NA  Prior Living Arrangements/Services Living arrangements for the past 2 months: Single Family Home Lives with:: Relatives Patient language and need for interpreter reviewed:: Yes        Need for Family Participation in Patient Care: Yes (Comment) Care giver support system in place?: Yes (comment)   Criminal Activity/Legal Involvement Pertinent to Current Situation/Hospitalization: No - Comment as needed  Activities of Daily Living Home Assistive Devices/Equipment: None ADL Screening (condition at time of admission) Patient's cognitive ability adequate to safely complete daily activities?: Yes Is the patient deaf or have difficulty hearing?: No Does the patient have difficulty seeing, even when wearing glasses/contacts?: No Does the patient have difficulty concentrating, remembering, or making decisions?: No Patient able to express need for assistance with ADLs?: Yes Does the patient have difficulty dressing or bathing?: No Independently  performs ADLs?: Yes (appropriate for developmental age) Does the patient have difficulty walking or climbing stairs?: No Weakness of Legs: None Weakness of Arms/Hands: None  Permission Sought/Granted Permission sought to share information with : Family Supports, Case Manager   Emotional Assessment Appearance:: Appears stated age       Alcohol / Substance Use: Not Applicable Psych Involvement: No (comment)  Admission diagnosis:  Cardiac arrest [facility]) [I46.9] Patient Active Problem List   Diagnosis Date Noted   Nausea and vomiting 07/23/2022   Hypokalemia 07/23/2022   Hypophosphatemia Q000111Q   Acute metabolic encephalopathy Q000111Q   Ventricular fibrillation ([place]) 07/23/2022   Cardiac arrest ([place]) 07/21/2022   Acute respiratory failure ([place]) 07/21/2022   AKI (acute kidney injury) ([place]) 07/21/2022   Aspiration pneumonia ([place]) 07/21/2022   Encephalopathy acute 07/21/2022   Abnormal bleeding in menstrual cycle 12/01/2020   Postpartum depression 10/08/2020   Gestational hypertension 07/31/2020   Irritable bowel syndrome with diarrhea 06/14/2020   PCP:  Patient, No Pcp Per Pharmacy:   [id number] [address] [place] [address]Phone: [phone]:[phone] [facility] [address]Phone: [phone]: [phone] Social Determinants of Health (SDOH) Social History: SDOH Screenings   Food Insecurity: No Food Insecurity (07/21/2022)  Housing: Low Risk  (07/21/2022)  Transportation Needs: No Transportation Needs (07/21/2022)  Utilities: Not At Risk (07/21/2022)  Depression (PHQ2-9): Medium Risk (10/08/2020)  Tobacco Use: Low Risk  (07/24/2022)   Readmission Risk Interventions  No data to display

## 2022-07-24 NOTE — Progress Notes (Signed)
PROGRESS NOTE    Alexandra Freeman  [id number] DOB: April 13, 1996 DOA: 07/20/2022 PCP: Patient, No Pcp Per  Chief Complaint  Patient presents with   POST CPR    Brief Narrative:   Patient is Alexandra Freeman 27 year old female pertinent PMH menorrhagia on birth control presents to [facility] ED on 2/18 cardiac arrest.   Per EMS, patient was at home-- had smoking weed with family earlier that night (pt and fam smoked, not just pt) and then was sitting on couch laughing w moms when suddenly pt slumped over. 1 mom checked pulse and was pulseless, started chest compressions. Other mom called EMS. Down for approximately 6 to 7 minutes with no CPR prior to EMS arrival.  Patient found to be in V-fib arrest, defibrillated, and received 11 minutes of CPR until ROSC. Patient post ROSC in vtach was then cardioverted and given amio with sinus rhythm. Patient transported to [facility].  In route to [facility], patient biting at igel supraglottic airway and was given 50 mics of fentanyl.   Upon arrival to [facility] ED, patient vitals stable moving extremities.  Temp 95.1 F.  Airway exchanged for ETT. CXR showing ETT in good position. CTA chest without PE. CT head no acute abnormality. EKG qtc 495, no ST elevations. ABG 7.24, 41, 191, 18.  Potassium 3.4, glucose 185, creatinine 1.28, calcium 8.3.  Troponin 107.  Ethanol WNL.  UDS positive for THC.  UA unremarkable.  PCCM consulted for ICU admission.    Assessment & Plan:   Principal Problem:   Cardiac arrest [facility]) Active Problems:   Acute respiratory failure (HCC)   AKI (acute kidney injury) ([place])   Aspiration pneumonia (HCC)   Encephalopathy acute   Nausea and vomiting   Hypokalemia   Hypophosphatemia   Acute metabolic encephalopathy   Ventricular fibrillation (HCC)  Witnessed VF arrest  - unclear cause.  did get immediate bystander CPR. about 17-18 min downtime total. - plan for cardiac MRI today - eventual ICD, but needs improved mental status first - plan for ICD prior to  discharge - [place] 2/21 with normal coronary arteries - echo 2/19 with EF 50-55%, no RWMA, mildly elevated PASP, dilated IVC with <50% resp variability - no acute intracranial process - no fam hx of sudden cardiac death  - lytes K>4 mag>2  Acute encephalopathy  - meds, ICU delirium, probably some component of anoxic injury  - MRI 2/21 motion limited without definite evidence of hypoxic/ischemic injury - consider repeat MRI - SLP cognitive eval.  PT/OT. - will need outpatient neurology   Possible aspiration  Completed short course unasyn   Transaminitis   most likely in setting of arrest downtrending   Elevated trops  Per cards, w/u for arrest as noted above LHC without CAD  N/v  resolved   Anemia, stable -PRN CBC   Hypophosphatemia Hypokalemia  P -replace -Cont to trend     DVT prophylaxis: heparin  Code Status: full Family Communication: none Disposition:   Status is: Inpatient Remains inpatient appropriate because: pending cardiac workup   Consultants:  cardiology  Procedures:  LHC 1.  Normal coronary arteries. 2.  Left ventricular angiography was not performed.  EF was 50 to 55% by echo. 3.  Mildly elevated left ventricular end-diastolic pressure at 14 mmHg.   Recommendations: No ischemic etiology is identified for cardiac arrest.  Echo IMPRESSIONS     1. Left ventricular ejection fraction, by estimation, is 50 to 55%. The  left ventricle has low normal function. The left ventricle  has no regional  wall motion abnormalities. Left ventricular diastolic parameters were  normal.   2. Right ventricular systolic function is normal. The right ventricular  size is normal. There is mildly elevated pulmonary artery systolic  pressure. The estimated right ventricular systolic pressure is [id number] mmHg.   3. The mitral valve is normal in structure. No evidence of mitral valve  regurgitation. No evidence of mitral stenosis.   4. The aortic valve is tricuspid.  Aortic valve regurgitation is not  visualized. No aortic stenosis is present.   5. The inferior vena cava is dilated in size with <50% respiratory  variability, suggesting right atrial pressure of 15 mmHg.   Antimicrobials:  Anti-infectives (From admission, onward)    Start     Dose/Rate Route Frequency Ordered Stop   07/21/22 1300  Ampicillin-Sulbactam (UNASYN) 3 g in sodium chloride 0.9 % 100 mL IVPB        3 g 200 mL/hr over 30 Minutes Intravenous Every 8 hours 07/21/22 1206 07/24/22 0522       Subjective: No new complaints Intermittent confusion  Objective: Vitals:   07/24/22 0428 07/24/22 0430 07/24/22 0733 07/24/22 1155  BP: (!) 149/89 (!) 149/84 (!) 144/71 126/80  Pulse: 68 (!) 51 (!) 50 62  Resp: 17 16 17 20  $ Temp: 98.1 F (36.7 C)  98 F (36.7 C) 99.1 F (37.3 C)  TempSrc: Oral  Oral Oral  SpO2: 100% 100% 99% 95%  Weight:  69.8 kg    Height:        Intake/Output Summary (Last 24 hours) at 07/24/2022 1404 Last data filed at 07/24/2022 [id number] Gross per 24 hour  Intake 2847.81 ml  Output --  Net 2847.81 ml   Filed Weights   07/23/22 0702 07/23/22 1020 07/24/22 0430  Weight: 74.4 kg 71.7 kg 69.8 kg    Examination:  General exam: Appears calm and comfortable  Respiratory system: unlabored Cardiovascular system: RRR Gastrointestinal system: Abdomen is nondistended, soft and nontender Central nervous system: Alert and oriented. No focal neurological deficits. Extremities: no LEE   Data Reviewed: I have personally reviewed following labs and imaging studies  CBC: Recent Labs  Lab 07/20/22 2234 07/20/22 2315 07/21/22 0015 07/22/22 0453 07/23/22 0621  WBC 10.3  --  21.6* 12.2* 9.6  NEUTROABS 6.7  --   --   --   --   HGB 12.4  13.6 12.9 12.4 10.8* 10.8*  HCT 37.4  40.0 38.0 39.8 32.9* 33.0*  MCV 93.0  --  95.2 93.2 93.0  PLT 217  --  257 147* [id number]    Basic Metabolic Panel: Recent Labs  Lab 07/21/22 0015 07/21/22 0749 07/21/22 1748  07/22/22 0453 07/22/22 1632 07/23/22 0621 07/24/22 0253  NA 137 135  --  138  --  137 139  K 3.4* 4.3  --  4.4  --  3.1* 3.7  CL 110 111  --  110  --  106 110  CO2 18* 19*  --  21*  --  23 21*  GLUCOSE 171* 111*  --  108*  --  83 82  BUN 9 9  --  8  --  6 5*  CREATININE 1.22* 0.79  --  0.77  --  0.91 0.87  CALCIUM 7.6* 8.0*  --  8.3*  --  8.1* 8.3*  MG 2.1 2.1 2.0 2.1 2.0 1.9  --   PHOS  --   --  2.8 3.1 2.1* 2.5  --  GFR: Estimated Creatinine Clearance: 95.2 mL/min (by C-G formula based on SCr of 0.87 mg/dL).  Liver Function Tests: Recent Labs  Lab 07/20/22 2234 07/21/22 0015 07/23/22 0621 07/24/22 0253  AST 141* 145* 256* 187*  ALT 92* 88* 73* 65*  ALKPHOS 88 88 68 62  BILITOT 0.4 0.8 0.5 0.7  PROT 7.0 6.8 6.2* 6.1*  ALBUMIN 3.7 3.8 3.3* 3.2*    CBG: Recent Labs  Lab 07/21/22 1945 07/21/22 2326 07/22/22 0411 07/22/22 0800 07/22/22 1141  GLUCAP 106* 115* 104* 110* 108*     Recent Results (from the past 240 hour(s))  MRSA Next Gen by PCR, Nasal     Status: None   Collection Time: 07/21/22  1:01 AM   Specimen: Nasal Mucosa; Nasal Swab  Result Value Ref Range Status   MRSA by PCR Next Gen NOT DETECTED NOT DETECTED Final    Comment: (NOTE) The GeneXpert MRSA Assay (FDA approved for NASAL specimens only), is one component of Vince Ainsley comprehensive MRSA colonization surveillance program. It is not intended to diagnose MRSA infection nor to guide or monitor treatment for MRSA infections. Test performance is not FDA approved in patients less than 22 years old. Performed at [facility], [place] [address], [place], [place] [address]   Resp panel by RT-PCR (RSV, Flu Hiep Ollis&B, Covid) Anterior Nasal Swab     Status: None   Collection Time: 07/21/22  2:09 AM   Specimen: Anterior Nasal Swab  Result Value Ref Range Status   SARS Coronavirus 2 by RT PCR NEGATIVE NEGATIVE Final   Influenza Ashaun Gaughan by PCR NEGATIVE NEGATIVE Final   Influenza B by PCR NEGATIVE NEGATIVE Final     Comment: (NOTE) The Xpert Xpress SARS-CoV-2/FLU/RSV plus assay is intended as an aid in the diagnosis of influenza from Nasopharyngeal swab specimens and should not be used as Mallerie Blok sole basis for treatment. Nasal washings and aspirates are unacceptable for Xpert Xpress SARS-CoV-2/FLU/RSV testing.  Fact Sheet for Patients: EntrepreneurPulse.com.au  Fact Sheet for Healthcare Providers: IncredibleEmployment.be  This test is not yet approved or cleared by the [place] FDA and has been authorized for detection and/or diagnosis of SARS-CoV-2 by FDA under an Emergency Use Authorization (EUA). This EUA will remain in effect (meaning this test can be used) for the duration of the COVID-19 declaration under Section 564(b)(1) of the Act, 21 U.S.C. section 360bbb-3(b)(1), unless the authorization is terminated or revoked.     Resp Syncytial Virus by PCR NEGATIVE NEGATIVE Final    Comment: (NOTE) Fact Sheet for Patients: EntrepreneurPulse.com.au  Fact Sheet for Healthcare Providers: IncredibleEmployment.be  This test is not yet approved or cleared by the [place] FDA and has been authorized for detection and/or diagnosis of SARS-CoV-2 by FDA under an Emergency Use Authorization (EUA). This EUA will remain in effect (meaning this test can be used) for the duration of the COVID-19 declaration under Section 564(b)(1) of the Act, 21 U.S.C. section 360bbb-3(b)(1), unless the authorization is terminated or revoked.  Performed at [facility], [place] [address] [place], [place] [address]          Radiology Studies: CARDIAC CATHETERIZATION  Result Date: 07/23/2022 1.  Normal coronary arteries. 2.  Left ventricular angiography was not performed.  EF was 50 to 55% by echo. 3.  Mildly elevated left ventricular end-diastolic pressure at 14 mmHg. Recommendations: No ischemic etiology is identified for cardiac  arrest.   MR BRAIN WO CONTRAST  Result Date: 07/23/2022 CLINICAL DATA:  Anoxic brain damage  EXAM: MRI HEAD WITHOUT CONTRAST TECHNIQUE: Multiplanar, multiecho pulse sequences of the brain and surrounding structures were obtained without intravenous contrast. COMPARISON:  CT head July 20, 2022. FINDINGS: Motion study.  Within this limitation: Brain: No acute infarction, hemorrhage, hydrocephalus, extra-axial collection or mass lesion. No convincing restricted diffusion involving the basal ganglia. Vascular: Major arterial flow voids are maintained at the skull base. Skull and upper cervical spine: Normal marrow signal. Sinuses/Orbits: Mucosal thickening of the right sphenoid and maxillary sinuses. No acute orbital findings. Other: No mastoid effusions. IMPRESSION: Motion limited study without definite evidence of hypoxic/ischemic injury. If there is continued clinical concern, Humbert Morozov follow-up MRI could assess for progressive/evolving changes. Electronically Signed   By: Margaretha Sheffield M.D.   On: 07/23/2022 11:46        Scheduled Meds:  docusate sodium  100 mg Oral BID   heparin injection (subcutaneous)  5,000 Units Subcutaneous Q8H   lidocaine  1-2 patch Transdermal Q24H   sodium chloride flush  3 mL Intravenous Q12H   Continuous Infusions:  sodium chloride     lactated ringers 75 mL/hr at 07/24/22 0924   promethazine (PHENERGAN) injection (IM or IVPB) Stopped (07/22/22 1227)     LOS: 3 days    Time spent: over 30 min    Fayrene Helper, MD [facility]   To contact the attending provider between 7A-7P or the covering provider during after hours 7P-7A, please log into the web site [email or web address] and access using universal [place] password for that web site. If you do not have the password, please call the hospital operator.  07/24/2022, 2:04 PM

## 2022-07-24 NOTE — Progress Notes (Addendum)
Rounding Note    Patient Name: Alexandra Freeman Date of Encounter: 07/24/2022  [facility] Cardiologist: None   Subjective   When asked she she felt, she said "she really didn't know", not unreasonable.  No active pain/SOB  Inpatient Medications    Scheduled Meds:  docusate  100 mg Per Tube BID   heparin injection (subcutaneous)  5,000 Units Subcutaneous Q8H   lidocaine  1-2 patch Transdermal Q24H   sodium chloride flush  3 mL Intravenous Q12H   Continuous Infusions:  sodium chloride     lactated ringers 75 mL/hr at 07/24/22 0539   promethazine (PHENERGAN) injection (IM or IVPB) Stopped (07/22/22 1227)   PRN Meds: sodium chloride, acetaminophen, docusate sodium, ketorolac, melatonin, midazolam, ondansetron (ZOFRAN) IV, mouth rinse, polyethylene glycol, promethazine (PHENERGAN) injection (IM or IVPB) **OR** promethazine, sodium chloride flush   Vital Signs    Vitals:   07/23/22 2327 07/24/22 0428 07/24/22 0430 07/24/22 0733  BP: 134/65 (!) 149/89 (!) 149/84 (!) 144/71  Pulse:  68 (!) 51 (!) 50  Resp: (!) 25 17 16 17  $ Temp: 98.7 F (37.1 C) 98.1 F (36.7 C)  98 F (36.7 C)  TempSrc: Oral Oral  Oral  SpO2: 99% 100% 100% 99%  Weight:   69.8 kg   Height:        Intake/Output Summary (Last 24 hours) at 07/24/2022 0851 Last data filed at 07/24/2022 0539 Gross per 24 hour  Intake 2602.46 ml  Output --  Net 2602.46 ml      07/24/2022    4:30 AM 07/23/2022   10:20 AM 07/23/2022    7:02 AM  Last 3 Weights  Weight (lbs) 153 lb 14.1 oz 158 lb 1.1 oz 164 lb 0.4 oz  Weight (kg) 69.8 kg 71.7 kg 74.4 kg      Telemetry    SR 60's  - Personally Reviewed  ECG    SR 60bpm, QTc 412m - Personally Reviewed  Physical Exam   GEN: No acute distress.   Neck: No JVD Cardiac: RRR, no murmurs, rubs, or gallops.  Respiratory: Clear to auscultation bilaterally. GI: Soft, nontender, non-distended  MS: No edema; No deformity. Neuro:  Nonfocal, Mom reports  still not to baseline MS, no focal neuro deficits Psych: pleasant/cooperative   Labs    High Sensitivity Troponin:   Recent Labs  Lab 07/20/22 2234 07/21/22 0015  TROPONINIHS 107* 316*     Chemistry Recent Labs  Lab 07/21/22 0015 07/21/22 0749 07/22/22 0453 07/22/22 1632 07/23/22 0621 07/24/22 0253  NA 137   < > 138  --  137 139  K 3.4*   < > 4.4  --  3.1* 3.7  CL 110   < > 110  --  106 110  CO2 18*   < > 21*  --  23 21*  GLUCOSE 171*   < > 108*  --  83 82  BUN 9   < > 8  --  6 5*  CREATININE 1.22*   < > 0.77  --  0.91 0.87  CALCIUM 7.6*   < > 8.3*  --  8.1* 8.3*  MG 2.1   < > 2.1 2.0 1.9  --   PROT 6.8  --   --   --  6.2* 6.1*  ALBUMIN 3.8  --   --   --  3.3* 3.2*  AST 145*  --   --   --  256* 187*  ALT 88*  --   --   --  73* 65*  ALKPHOS 88  --   --   --  68 62  BILITOT 0.8  --   --   --  0.5 0.7  GFRNONAA >60   < > >60  --  >60 >60  ANIONGAP 9   < > 7  --  8 8   < > = values in this interval not displayed.    Lipids  Recent Labs  Lab 07/22/22 0453  TRIG 48    Hematology Recent Labs  Lab 07/21/22 0015 07/22/22 0453 07/23/22 0621  WBC 21.6* 12.2* 9.6  RBC 4.18 3.53* 3.55*  HGB 12.4 10.8* 10.8*  HCT 39.8 32.9* 33.0*  MCV 95.2 93.2 93.0  MCH 29.7 30.6 30.4  MCHC 31.2 32.8 32.7  RDW 14.6 14.7 14.5  PLT 257 147* 161   Thyroid No results for input(s): "TSH", "FREET4" in the last 168 hours.  BNPNo results for input(s): "BNP", "PROBNP" in the last 168 hours.  DDimer No results for input(s): "DDIMER" in the last 168 hours.   Radiology     MR BRAIN WO CONTRAST Result Date: 07/23/2022 CLINICAL DATA:  Anoxic brain damage EXAM: MRI HEAD WITHOUT CONTRAST TECHNIQUE: Multiplanar, multiecho pulse sequences of the brain and surrounding structures were obtained without intravenous contrast. COMPARISON:  CT head July 20, 2022. FINDINGS: Motion study.  Within this limitation: Brain: No acute infarction, hemorrhage, hydrocephalus, extra-axial collection or mass  lesion. No convincing restricted diffusion involving the basal ganglia. Vascular: Major arterial flow voids are maintained at the skull base. Skull and upper cervical spine: Normal marrow signal. Sinuses/Orbits: Mucosal thickening of the right sphenoid and maxillary sinuses. No acute orbital findings. Other: No mastoid effusions. IMPRESSION: Motion limited study without definite evidence of hypoxic/ischemic injury. If there is continued clinical concern, a follow-up MRI could assess for progressive/evolving changes. Electronically Signed   By: Margaretha Sheffield M.D.   On: 07/23/2022 11:46    Cardiac Studies    07/23/22: LHC Normal coronary arteries. 2.  Left ventricular angiography was not performed.  EF was 50 to 55% by echo. 3.  Mildly elevated left ventricular end-diastolic pressure at 14 mmHg.   07/21/22: TTE 1. Left ventricular ejection fraction, by estimation, is 50 to 55%. The  left ventricle has low normal function. The left ventricle has no regional  wall motion abnormalities. Left ventricular diastolic parameters were  normal.   2. Right ventricular systolic function is normal. The right ventricular  size is normal. There is mildly elevated pulmonary artery systolic  pressure. The estimated right ventricular systolic pressure is [id number] mmHg.   3. The mitral valve is normal in structure. No evidence of mitral valve  regurgitation. No evidence of mitral stenosis.   4. The aortic valve is tricuspid. Aortic valve regurgitation is not  visualized. No aortic stenosis is present.   5. The inferior vena cava is dilated in size with <50% respiratory  variability, suggesting right atrial pressure of 15 mmHg   Patient Profile     27 y.o. 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 admitted with cardiac arrest  Assessment & Plan    Cardiac arrest No historical cardia data TTE w/LVEF 50-55%, no WMA No CAD QT better, likely was 2/2 arrest Planned for c.MRI Eventual ICD  Encephalopathy  remains, perhaps a bit better? Dr. Quentin Ore saw pt, mom bedside Mom mentioned the patient has not slept  Urged opening open the blinds in the day, ambulating, staying OOB Importance of the MRI. Appreciate Dr. Carlis Abbott,  has versed ordered, hopefully she can complete the scan.  Will hold off implant ICD today, get the MRI when we can, hopefully mentation will clear Limited brain MRI IMPRESSION: Motion limited study without definite evidence of hypoxic/ischemic injury. If there is continued clinical concern, a follow-up MRI could assess for progressive/evolving changes.   For questions or updates, please contact [place] Please consult [email or web address] for contact info under        Signed, Baldwin Jamaica, PA-C  07/24/2022, 8:51 AM

## 2022-07-25 ENCOUNTER — Telehealth: Payer: Self-pay | Admitting: Critical Care Medicine

## 2022-07-25 DIAGNOSIS — I469 Cardiac arrest, cause unspecified: Secondary | ICD-10-CM | POA: Diagnosis not present

## 2022-07-25 LAB — CBC WITH DIFFERENTIAL/PLATELET
Abs Immature Granulocytes: 0.02 10*3/uL (ref 0.00–0.07)
Basophils Absolute: 0 10*3/uL (ref 0.0–0.1)
Basophils Relative: 0 %
Eosinophils Absolute: 0.1 10*3/uL (ref 0.0–0.5)
Eosinophils Relative: 1 %
HCT: 31.6 % — ABNORMAL LOW (ref 36.0–46.0)
Hemoglobin: 10.8 g/dL — ABNORMAL LOW (ref 12.0–15.0)
Immature Granulocytes: 0 %
Lymphocytes Relative: 21 %
Lymphs Abs: 1.4 10*3/uL (ref 0.7–4.0)
MCH: 30.9 pg (ref 26.0–34.0)
MCHC: 34.2 g/dL (ref 30.0–36.0)
MCV: 90.5 fL (ref 80.0–100.0)
Monocytes Absolute: 0.5 10*3/uL (ref 0.1–1.0)
Monocytes Relative: 7 %
Neutro Abs: 4.8 10*3/uL (ref 1.7–7.7)
Neutrophils Relative %: 71 %
Platelets: 173 10*3/uL (ref 150–400)
RBC: 3.49 MIL/uL — ABNORMAL LOW (ref 3.87–5.11)
RDW: 14.1 % (ref 11.5–15.5)
WBC: 6.8 10*3/uL (ref 4.0–10.5)
nRBC: 0 % (ref 0.0–0.2)

## 2022-07-25 LAB — COMPREHENSIVE METABOLIC PANEL
ALT: 53 U/L — ABNORMAL HIGH (ref 0–44)
AST: 107 U/L — ABNORMAL HIGH (ref 15–41)
Albumin: 3.2 g/dL — ABNORMAL LOW (ref 3.5–5.0)
Alkaline Phosphatase: 54 U/L (ref 38–126)
Anion gap: 9 (ref 5–15)
BUN: 5 mg/dL — ABNORMAL LOW (ref 6–20)
CO2: 20 mmol/L — ABNORMAL LOW (ref 22–32)
Calcium: 8.5 mg/dL — ABNORMAL LOW (ref 8.9–10.3)
Chloride: 110 mmol/L (ref 98–111)
Creatinine, Ser: 0.92 mg/dL (ref 0.44–1.00)
GFR, Estimated: >60 mL/min (ref >60)
Glucose, Bld: 83 mg/dL (ref 70–99)
Potassium: 3.4 mmol/L — ABNORMAL LOW (ref 3.5–5.1)
Sodium: 139 mmol/L (ref 135–145)
Total Bilirubin: 0.5 mg/dL (ref 0.3–1.2)
Total Protein: 6.2 g/dL — ABNORMAL LOW (ref 6.5–8.1)

## 2022-07-25 LAB — MAGNESIUM: Magnesium: 1.8 mg/dL (ref 1.7–2.4)

## 2022-07-25 LAB — PHOSPHORUS: Phosphorus: 3.3 mg/dL (ref 2.5–4.6)

## 2022-07-25 MED ORDER — POTASSIUM CHLORIDE CRYS ER 20 MEQ PO TBCR
40.0000 meq | EXTENDED_RELEASE_TABLET | ORAL | Status: AC
  Administered 2022-07-25 (×2): 40 meq via ORAL
  Filled 2022-07-25 (×2): qty 2

## 2022-07-25 MED ORDER — MAGNESIUM OXIDE -MG SUPPLEMENT 400 (240 MG) MG PO TABS
400.0000 mg | ORAL_TABLET | Freq: Every day | ORAL | Status: DC
  Administered 2022-07-25 – 2022-07-31 (×7): 400 mg via ORAL
  Filled 2022-07-25 (×7): qty 1

## 2022-07-25 MED ORDER — TRAZODONE HCL 50 MG PO TABS
50.0000 mg | ORAL_TABLET | Freq: Every day | ORAL | Status: DC
  Administered 2022-07-25 – 2022-07-30 (×6): 50 mg via ORAL
  Filled 2022-07-25 (×6): qty 1

## 2022-07-25 NOTE — Telephone Encounter (Signed)
Received a FMLA form from [employer] for this patient's mother - Jeananne Rama.  It was addressed to Dr. Noemi Chapel.   I have reached out to the case manager, Jacqlyn Krauss, RN via [place], asking who this form needs to go to for completion.

## 2022-07-25 NOTE — Progress Notes (Signed)
PROGRESS NOTE    Darothy Chappelle  [id number] DOB: [birth year] DOA: 07/20/2022 PCP: Patient, No Pcp Per  Chief Complaint  Patient presents with   POST CPR    Brief Narrative:   Patient is Alexandra Freeman 27 year old female pertinent PMH menorrhagia on birth control presents to [facility] ED on 2/18 cardiac arrest.   Per EMS, patient was at home-- had smoking weed with family earlier that night (pt and fam smoked, not just pt) and then was sitting on couch laughing w moms when suddenly pt slumped over. 1 mom checked pulse and was pulseless, started chest compressions. Other mom called EMS. Down for approximately 6 to 7 minutes with no CPR prior to EMS arrival.  Patient found to be in V-fib arrest, defibrillated, and received 11 minutes of CPR until ROSC. Patient post ROSC in vtach was then cardioverted and given amio with sinus rhythm. Patient transported to [facility].  In route to [facility], patient biting at igel supraglottic airway and was given 50 mics of fentanyl.   Upon arrival to [facility] ED, patient vitals stable moving extremities.  Temp 95.1 F.  Airway exchanged for ETT. CXR showing ETT in good position. CTA chest without PE. CT head no acute abnormality. EKG qtc 495, no ST elevations. ABG 7.24, 41, 191, 18.  Potassium 3.4, glucose 185, creatinine 1.28, calcium 8.3.  Troponin 107.  Ethanol WNL.  UDS positive for THC.  UA unremarkable.  PCCM consulted for ICU admission.    Assessment & Plan:   Principal Problem:   Cardiac arrest [facility]) Active Problems:   Acute respiratory failure (HCC)   AKI (acute kidney injury) ([place])   Aspiration pneumonia (HCC)   Encephalopathy acute   Nausea and vomiting   Hypokalemia   Hypophosphatemia   Acute metabolic encephalopathy   Ventricular fibrillation (HCC)  Witnessed VF arrest  - unclear cause.  did get immediate bystander CPR. about 17-18 min downtime total. - plan for cardiac MRI under anesthesia followed by defibrillator  - eventual ICD, but needs improved mental  status first - plan for ICD prior to discharge - LHC 2/21 with normal coronary arteries - echo 2/19 with EF 50-55%, no RWMA, mildly elevated PASP, dilated IVC with <50% resp variability - no acute intracranial process - no fam hx of sudden cardiac death  - lytes K>4 mag>2  Acute encephalopathy  - meds, ICU delirium, probably some component of anoxic injury  - MRI 2/21 motion limited without definite evidence of hypoxic/ischemic injury - consider repeat MRI (might discuss doing this under anesthesia with planned cardiac MRI, will follow with family) - SLP cognitive eval.  PT/OT. - will need outpatient neurology   Possible aspiration  Completed short course unasyn   Transaminitis   most likely in setting of arrest downtrending   Elevated trops  Per cards, w/u for arrest as noted above LHC without CAD  N/v  resolved   Anemia, stable -PRN CBC   Hypophosphatemia Hypokalemia  P -replace -Cont to trend   Insomnia Trazodone    DVT prophylaxis: heparin  Code Status: full Family Communication: none Disposition:   Status is: Inpatient Remains inpatient appropriate because: pending cardiac workup   Consultants:  cardiology  Procedures:  LHC 1.  Normal coronary arteries. 2.  Left ventricular angiography was not performed.  EF was 50 to 55% by echo. 3.  Mildly elevated left ventricular end-diastolic pressure at 14 mmHg.   Recommendations: No ischemic etiology is identified for cardiac arrest.  Echo IMPRESSIONS  1. Left ventricular ejection fraction, by estimation, is 50 to 55%. The  left ventricle has low normal function. The left ventricle has no regional  wall motion abnormalities. Left ventricular diastolic parameters were  normal.   2. Right ventricular systolic function is normal. The right ventricular  size is normal. There is mildly elevated pulmonary artery systolic  pressure. The estimated right ventricular systolic pressure is [id number] mmHg.   3.  The mitral valve is normal in structure. No evidence of mitral valve  regurgitation. No evidence of mitral stenosis.   4. The aortic valve is tricuspid. Aortic valve regurgitation is not  visualized. No aortic stenosis is present.   5. The inferior vena cava is dilated in size with <50% respiratory  variability, suggesting right atrial pressure of 15 mmHg.   Antimicrobials:  Anti-infectives (From admission, onward)    Start     Dose/Rate Route Frequency Ordered Stop   07/21/22 1300  Ampicillin-Sulbactam (UNASYN) 3 g in sodium chloride 0.9 % 100 mL IVPB        3 g 200 mL/hr over 30 Minutes Intravenous Every 8 hours 07/21/22 1206 07/24/22 0522       Subjective: No new complaints Some difficulty sleeping  Objective: Vitals:   07/24/22 2006 07/25/22 0611 07/25/22 0859 07/25/22 1136  BP: (!) 142/98 (!) 119/90 (!) 152/100 (!) 145/95  Pulse: 72 78 60 (!) 58  Resp: '20 19 18 18  '$ Temp: 98 F (36.7 C) 98 F (36.7 C) 97.9 F (36.6 C) 97.7 F (36.5 C)  TempSrc: Oral Oral Oral Oral  SpO2: 100% 96% 92% 98%  Weight:  69.4 kg    Height:        Intake/Output Summary (Last 24 hours) at 07/25/2022 1723 Last data filed at 07/24/2022 1926 Gross per 24 hour  Intake 400.4 ml  Output --  Net 400.4 ml   Filed Weights   07/23/22 1020 07/24/22 0430 07/25/22 0611  Weight: 71.7 kg 69.8 kg 69.4 kg    Examination:  General: No acute distress. Cardiovascular: RRR Lungs: unlabored Abdomen: Soft, nontender, nondistended Neurological: Alert and oriented 3. Moves all extremities 4 with equal strength. Cranial nerves II through XII grossly intact. Extremities: No clubbing or cyanosis. No edema.  Data Reviewed: I have personally reviewed following labs and imaging studies  CBC: Recent Labs  Lab 07/20/22 2234 07/20/22 2315 07/21/22 0015 07/22/22 0453 07/23/22 0621 07/25/22 0640  WBC 10.3  --  21.6* 12.2* 9.6 6.8  NEUTROABS 6.7  --   --   --   --  4.8  HGB 12.4  13.6 12.9 12.4 10.8*  10.8* 10.8*  HCT 37.4  40.0 38.0 39.8 32.9* 33.0* 31.6*  MCV 93.0  --  95.2 93.2 93.0 90.5  PLT 217  --  257 147* 161 [id number]    Basic Metabolic Panel: Recent Labs  Lab 07/21/22 0749 07/21/22 1748 07/22/22 0453 07/22/22 1632 07/23/22 0621 07/24/22 0253 07/25/22 0640  NA 135  --  138  --  137 139 139  K 4.3  --  4.4  --  3.1* 3.7 3.4*  CL 111  --  110  --  106 110 110  CO2 19*  --  21*  --  23 21* 20*  GLUCOSE 111*  --  108*  --  83 82 83  BUN 9  --  8  --  6 5* 5*  CREATININE 0.79  --  0.77  --  0.91 0.87 0.92  CALCIUM 8.0*  --  8.3*  --  8.1* 8.3* 8.5*  MG 2.1 2.0 2.1 2.0 1.9  --  1.8  PHOS  --  2.8 3.1 2.1* 2.5  --  3.3    GFR: Estimated Creatinine Clearance: 89.9 mL/min (by C-G formula based on SCr of 0.92 mg/dL).  Liver Function Tests: Recent Labs  Lab 07/20/22 2234 07/21/22 0015 07/23/22 0621 07/24/22 0253 07/25/22 0640  AST 141* 145* 256* 187* 107*  ALT 92* 88* 73* 65* 53*  ALKPHOS 88 88 68 62 54  BILITOT 0.4 0.8 0.5 0.7 0.5  PROT 7.0 6.8 6.2* 6.1* 6.2*  ALBUMIN 3.7 3.8 3.3* 3.2* 3.2*    CBG: Recent Labs  Lab 07/21/22 1945 07/21/22 2326 07/22/22 0411 07/22/22 0800 07/22/22 1141  GLUCAP 106* 115* 104* 110* 108*     Recent Results (from the past 240 hour(s))  MRSA Next Gen by PCR, Nasal     Status: None   Collection Time: 07/21/22  1:01 AM   Specimen: Nasal Mucosa; Nasal Swab  Result Value Ref Range Status   MRSA by PCR Next Gen NOT DETECTED NOT DETECTED Final    Comment: (NOTE) The GeneXpert MRSA Assay (FDA approved for NASAL specimens only), is one component of Bess Saltzman comprehensive MRSA colonization surveillance program. It is not intended to diagnose MRSA infection nor to guide or monitor treatment for MRSA infections. Test performance is not FDA approved in patients less than 60 years old. Performed at [facility], [place] [address] [place], [place] [address]   Resp panel by RT-PCR (RSV, Flu Kedron Uno&B, Covid) Anterior Nasal Swab     Status:  None   Collection Time: 07/21/22  2:09 AM   Specimen: Anterior Nasal Swab  Result Value Ref Range Status   SARS Coronavirus 2 by RT PCR NEGATIVE NEGATIVE Final   Influenza Zenna Traister by PCR NEGATIVE NEGATIVE Final   Influenza B by PCR NEGATIVE NEGATIVE Final    Comment: (NOTE) The Xpert Xpress SARS-CoV-2/FLU/RSV plus assay is intended as an aid in the diagnosis of influenza from Nasopharyngeal swab specimens and should not be used as Ambers Iyengar sole basis for treatment. Nasal washings and aspirates are unacceptable for Xpert Xpress SARS-CoV-2/FLU/RSV testing.  Fact Sheet for Patients: EntrepreneurPulse.com.au  Fact Sheet for Healthcare Providers: IncredibleEmployment.be  This test is not yet approved or cleared by the [place] FDA and has been authorized for detection and/or diagnosis of SARS-CoV-2 by FDA under an Emergency Use Authorization (EUA). This EUA will remain in effect (meaning this test can be used) for the duration of the COVID-19 declaration under Section 564(b)(1) of the Act, 21 U.S.C. section 360bbb-3(b)(1), unless the authorization is terminated or revoked.     Resp Syncytial Virus by PCR NEGATIVE NEGATIVE Final    Comment: (NOTE) Fact Sheet for Patients: EntrepreneurPulse.com.au  Fact Sheet for Healthcare Providers: IncredibleEmployment.be  This test is not yet approved or cleared by the [place] FDA and has been authorized for detection and/or diagnosis of SARS-CoV-2 by FDA under an Emergency Use Authorization (EUA). This EUA will remain in effect (meaning this test can be used) for the duration of the COVID-19 declaration under Section 564(b)(1) of the Act, 21 U.S.C. section 360bbb-3(b)(1), unless the authorization is terminated or revoked.  Performed at [facility], [place] [address], [place], [place] [address]          Radiology Studies: No results found.      Scheduled  Meds:  diclofenac Sodium  2 g Topical QID   docusate  sodium  100 mg Oral BID   heparin injection (subcutaneous)  5,000 Units Subcutaneous Q8H   lidocaine  1-2 patch Transdermal Q24H   magnesium oxide  400 mg Oral Daily   sodium chloride flush  3 mL Intravenous Q12H   Continuous Infusions:  sodium chloride     lactated ringers 75 mL/hr at 07/24/22 2045   promethazine (PHENERGAN) injection (IM or IVPB) Stopped (07/22/22 1227)     LOS: 4 days    Time spent: over 30 min    Fayrene Helper, MD [facility]   To contact the attending provider between 7A-7P or the covering provider during after hours 7P-7A, please log into the web site [email or web address] and access using universal [place] password for that web site. If you do not have the password, please call the hospital operator.  07/25/2022, 5:23 PM

## 2022-07-25 NOTE — Evaluation (Signed)
Occupational Therapy Evaluation Patient Details Name: Alexandra Freeman MRN: [id number] DOB: 06-08-1995 Today's Date: 07/25/2022   History of Present Illness 27 y/o female presents to [facility] on 2/18 with v-fib arrest, received CPR by EMS, downtime 18 minutes. ETT 2/18-20. [place] 2/21, awaiting pacemaker. PMH includes marijuana use.   Clinical Impression   Pt currently with functional limitations due to the deficits listed below (see OT Problem List). Prior to admit, pt and 39 month old son were living with Mom and Mother-in-law. Pt is independent at baseline and works full time at [employer]. Pt demonstrates great long term memory retrieval although has difficulty with short term memory retrieval. Recommend skilled OT to increase their safety and independence with ADL when discharged to venue listed below. Speech therapy consult also recommended.        Recommendations for follow up therapy are one component of a multi-disciplinary discharge planning process, led by the attending physician.  Recommendations may be updated based on patient status, additional functional criteria and insurance authorization.   Follow Up Recommendations  Outpatient OT (For higher level executive functioning skills)     Assistance Recommended at Discharge Intermittent Supervision/Assistance  Patient can return home with the following Direct supervision/assist for medications management;Direct supervision/assist for financial management;Assist for transportation;Assistance with cooking/housework    Functional Status Assessment  Patient has had a recent decline in their functional status and demonstrates the ability to make significant improvements in function in a reasonable and predictable amount of time.  Equipment Recommendations  None recommended by OT    Recommendations for Other Services Speech consult     Precautions / Restrictions Precautions Precautions: Fall Restrictions Weight Bearing  Restrictions: No      Mobility Bed Mobility Overal bed mobility: Independent      Transfers Overall transfer level: Independent      Balance Overall balance assessment: No apparent balance deficits (not formally assessed)     ADL either performed or assessed with clinical judgement   ADL Overall ADL's : Modified independent       Vision Baseline Vision/History: 0 No visual deficits Ability to See in Adequate Light: 0 Adequate              Pertinent Vitals/Pain Pain Assessment Pain Assessment:  (Reports that her chest is sore)     Hand Dominance Right   Extremity/Trunk Assessment Upper Extremity Assessment Upper Extremity Assessment: Overall WFL for tasks assessed   Lower Extremity Assessment Lower Extremity Assessment: Defer to PT evaluation   Cervical / Trunk Assessment Cervical / Trunk Assessment: Normal   Communication Communication Communication: No difficulties   Cognition Arousal/Alertness: Awake/alert Behavior During Therapy: Impulsive Overall Cognitive Status: Impaired/Different from baseline Area of Impairment: Memory, Awareness     Orientation Level: Situation, Time Current Attention Level: Sustained Memory: Decreased short-term memory Following Commands: Follows one step commands consistently       General Comments: Patient oriented to year and month, not day of the week. Difficulty with retrieval of new information. Was not able to tell me what happened to her although when informed that she had a cardiac arrest she replied that she wouldn't remember that since she was passed out. Was able to retrieve long term stored information (ie. her Son's birthday). Provided the wrong place of employment. Stated [employer] vs. [employer] which was her previous employer. Has been working at [employer] for 1 year. Reports that she lived alone and Mom provided reminder that she lives with her now and has been since December.  Home Living  Family/patient expects to be discharged to:: Private residence Living Arrangements: Parent (Mother and step-mom) Available Help at Discharge: Family;Available 24 hours/day Type of Home: House Home Access: Stairs to enter [employer] of Steps: 1   Home Layout: One level     Bathroom Shower/Tub: Teacher, early years/pre: Standard     Home Equipment: None   Additional Comments: pt's mother walks with a walker or cane. Recently moved in with Mom in December. Has a son who will turn 17 years old in March.      Prior Functioning/Environment Prior Level of Function : Independent/Modified Independent;Working/employed   Mobility Comments: works at [employer] in [employer]          OT Problem List: Decreased cognition      OT Treatment/Interventions:   Eval only   OT Goals(Current goals can be found in the care plan section) Acute Rehab OT Goals Patient Stated Goal: to go home  OT Frequency:  1 time visit       AM-PAC OT "6 Clicks" Daily Activity     Outcome Measure Help from another person eating meals?: None Help from another person taking care of personal grooming?: None Help from another person toileting, which includes using toliet, bedpan, or urinal?: None Help from another person bathing (including washing, rinsing, drying)?: None Help from another person to put on and taking off regular upper body clothing?: None Help from another person to put on and taking off regular lower body clothing?: None 6 Click Score: 24   End of Session    Activity Tolerance: Patient tolerated treatment well Patient left: in bed;with call bell/phone within reach;with family/visitor present  OT Visit Diagnosis: Other symptoms and signs involving cognitive function Symptoms and signs involving cognitive functions: Other cerebrovascular disease                Time: 1531-1600 OT Time Calculation (min): 29 min Charges:  OT General Charges $OT Visit: 1 Visit OT  Evaluation $OT Eval Moderate Complexity: 1 Mod OT Treatments $Cognitive Funtion inital: Initial 15 mins  [employer], OTR/L,CBIS  Supplemental OT - [facility] and [facility] Secure Chat Preferred    Carson Bogden, Clarene Duke 07/25/2022, 4:23 PM

## 2022-07-25 NOTE — Progress Notes (Signed)
Rounding Note    Patient Name: Alexandra Freeman Date of Encounter: 07/25/2022  [place] Cardiologist: None   Subjective   Ambulating in the hall wit her Mom, she remains sore in the chest but OK otherwise  Inpatient Medications    Scheduled Meds:  diclofenac Sodium  2 g Topical QID   docusate sodium  100 mg Oral BID   heparin injection (subcutaneous)  5,000 Units Subcutaneous Q8H   lidocaine  1-2 patch Transdermal Q24H   magnesium oxide  400 mg Oral Daily   potassium chloride  40 mEq Oral Q4H   sodium chloride flush  3 mL Intravenous Q12H   Continuous Infusions:  sodium chloride     lactated ringers 75 mL/hr at 07/24/22 2045   promethazine (PHENERGAN) injection (IM or IVPB) Stopped (07/22/22 1227)   PRN Meds: sodium chloride, acetaminophen, docusate sodium, ketorolac, melatonin, ondansetron (ZOFRAN) IV, mouth rinse, polyethylene glycol, promethazine (PHENERGAN) injection (IM or IVPB) **OR** promethazine, sodium chloride flush   Vital Signs    Vitals:   07/24/22 1626 07/24/22 2006 07/25/22 0611 07/25/22 0859  BP: 132/65 (!) 142/98 (!) 119/90 (!) 152/100  Pulse:  72 78 60  Resp: '20 20 19 18  '$ Temp: 98.1 F (36.7 C) 98 F (36.7 C) 98 F (36.7 C) 97.9 F (36.6 C)  TempSrc: Oral Oral Oral Oral  SpO2: 95% 100% 96% 92%  Weight:   69.4 kg   Height:        Intake/Output Summary (Last 24 hours) at 07/25/2022 1120 Last data filed at 07/24/2022 1926 Gross per 24 hour  Intake 1082.22 ml  Output --  Net 1082.22 ml      07/25/2022    6:11 AM 07/24/2022    4:30 AM 07/23/2022   10:20 AM  Last 3 Weights  Weight (lbs) 153 lb 153 lb 14.1 oz 158 lb 1.1 oz  Weight (kg) 69.4 kg 69.8 kg 71.7 kg      Telemetry    SR 60's  - Personally Reviewed  ECG    SR 60bpm, QTc 446m - Personally Reviewed  Physical Exam   unchanged GEN: No acute distress.   Neck: No JVD Cardiac: RRR, no murmurs, rubs, or gallops.  Respiratory: CTA b/l. GI: Soft, nontender,  non-distended  MS: No edema; No deformity. Neuro:  Nonfocal, still encephalopathic, but perhaps slowly improving Psych: pleasant/cooperative   Labs    High Sensitivity Troponin:   Recent Labs  Lab 07/20/22 2234 07/21/22 0015  TROPONINIHS 107* 316*     Chemistry Recent Labs  Lab 07/22/22 1632 07/23/22 0621 07/24/22 0253 07/25/22 0640  NA  --  137 139 139  K  --  3.1* 3.7 3.4*  CL  --  106 110 110  CO2  --  23 21* 20*  GLUCOSE  --  83 82 83  BUN  --  6 5* 5*  CREATININE  --  0.91 0.87 0.92  CALCIUM  --  8.1* 8.3* 8.5*  MG 2.0 1.9  --  1.8  PROT  --  6.2* 6.1* 6.2*  ALBUMIN  --  3.3* 3.2* 3.2*  AST  --  256* 187* 107*  ALT  --  73* 65* 53*  ALKPHOS  --  68 62 54  BILITOT  --  0.5 0.7 0.5  GFRNONAA  --  >60 >60 >60  ANIONGAP  --  '8 8 9    '$ Lipids  Recent Labs  Lab 07/22/22 0453  TRIG 48  Hematology Recent Labs  Lab 07/22/22 0453 07/23/22 0621 07/25/22 0640  WBC 12.2* 9.6 6.8  RBC 3.53* 3.55* 3.49*  HGB 10.8* 10.8* 10.8*  HCT 32.9* 33.0* 31.6*  MCV 93.2 93.0 90.5  MCH 30.6 30.4 30.9  MCHC 32.8 32.7 34.2  RDW 14.7 14.5 14.1  PLT 147* 161 173   Thyroid No results for input(s): "TSH", "FREET4" in the last 168 hours.  BNPNo results for input(s): "BNP", "PROBNP" in the last 168 hours.  DDimer No results for input(s): "DDIMER" in the last 168 hours.   Radiology     MR BRAIN WO CONTRAST Result Date: 07/23/2022 CLINICAL DATA:  Anoxic brain damage EXAM: MRI HEAD WITHOUT CONTRAST TECHNIQUE: Multiplanar, multiecho pulse sequences of the brain and surrounding structures were obtained without intravenous contrast. COMPARISON:  CT head July 20, 2022. FINDINGS: Motion study.  Within this limitation: Brain: No acute infarction, hemorrhage, hydrocephalus, extra-axial collection or mass lesion. No convincing restricted diffusion involving the basal ganglia. Vascular: Major arterial flow voids are maintained at the skull base. Skull and upper cervical spine: Normal  marrow signal. Sinuses/Orbits: Mucosal thickening of the right sphenoid and maxillary sinuses. No acute orbital findings. Other: No mastoid effusions. IMPRESSION: Motion limited study without definite evidence of hypoxic/ischemic injury. If there is continued clinical concern, a follow-up MRI could assess for progressive/evolving changes. Electronically Signed   By: Margaretha Sheffield M.D.   On: 07/23/2022 11:46    Cardiac Studies    07/23/22: LHC Normal coronary arteries. 2.  Left ventricular angiography was not performed.  EF was 50 to 55% by echo. 3.  Mildly elevated left ventricular end-diastolic pressure at 14 mmHg.   07/21/22: TTE 1. Left ventricular ejection fraction, by estimation, is 50 to 55%. The  left ventricle has low normal function. The left ventricle has no regional  wall motion abnormalities. Left ventricular diastolic parameters were  normal.   2. Right ventricular systolic function is normal. The right ventricular  size is normal. There is mildly elevated pulmonary artery systolic  pressure. The estimated right ventricular systolic pressure is [id number] mmHg.   3. The mitral valve is normal in structure. No evidence of mitral valve  regurgitation. No evidence of mitral stenosis.   4. The aortic valve is tricuspid. Aortic valve regurgitation is not  visualized. No aortic stenosis is present.   5. The inferior vena cava is dilated in size with <50% respiratory  variability, suggesting right atrial pressure of 15 mmHg   Patient Profile     27 y.o. 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 admitted with cardiac arrest  Assessment & Plan    Cardiac arrest No historical cardia data TTE w/LVEF 50-55%, no WMA No CAD QT better, likely was 2/2 arrest Planned for c.MRI Eventual ICD  Encephalopathy remains, perhaps a bit better, but no where near baseline She can not remember yesterday Though remembers other things clearly She can tell me why she is here, and she asks good  question Also mentioned she is going to work later today.  Dr. Quentin Ore saw pt, mom bedside Will plan c.MRI with anesthesia, unfortunately this is next week (Tuesday) Look to S-ICD later in the week next week I do not think she can be compliant with a life vest     For questions or updates, please contact [place] Please consult [email or web address] for contact info under        Signed, Baldwin Jamaica, PA-C  07/25/2022, 11:20 AM

## 2022-07-25 NOTE — Evaluation (Signed)
Physical Therapy Evaluation Patient Details Name: Alexandra Freeman MRN: [id number] DOB: 08-05-1995 Today's Date: 07/25/2022  History of Present Illness  27 yo female presents to [facility] on 2/18 with vfib arrest, received CPR by EMS, downtime 18 minutes. ETT 2/18-20. [place] 2/21, awaiting pacemaker. PMH includes marijuana use.  Clinical Impression   Pt presents with [facility] strength, balance, and activity tolerance. Pt ambulated hallway distance at mod I level for slightly increased time vs anticipated baseline, tolerates well with minimal complaints of chest discomfort (musculoskeletal due to chest compressions). Pt's main area of deficit appears to be cognition, especially in areas of memory and attention, information passed along to OT who plans to see pt later. Pt with no further acute or post-acute PT needs at this time, thank you.      Recommendations for follow up therapy are one component of a multi-disciplinary discharge planning process, led by the attending physician.  Recommendations may be updated based on patient status, additional functional criteria and insurance authorization.  Follow Up Recommendations No PT follow up      Assistance Recommended at Discharge None  Patient can return home with the following       Equipment Recommendations None recommended by PT  Recommendations for Other Services       Functional Status Assessment Patient has not had a recent decline in their functional status     Precautions / Restrictions Precautions Precautions: Fall (moderate) Restrictions Weight Bearing Restrictions: No      Mobility  Bed Mobility Overal bed mobility: Modified Independent                  Transfers Overall transfer level: Independent                      Ambulation/Gait Ambulation/Gait assistance: Modified independent (Device/Increase time) Gait Distance (Feet): 300 Feet Assistive device: None Gait Pattern/deviations: Step-through pattern,  Decreased stride length Gait velocity: decr     General Gait Details: initially slowed, progressing to normal gait speed. tolerates challenges to gait well (see balance section)  Stairs            Wheelchair Mobility    Modified Rankin (Stroke Patients Only)       Balance Overall balance assessment: Modified Independent Sitting-balance support: No upper extremity supported Sitting balance-Leahy Scale: Normal     Standing balance support: No upper extremity supported Standing balance-Leahy Scale: Good Standing balance comment: tolerates head turns, step over object, direction changes, pick up object off the floor                             Pertinent Vitals/Pain Pain Assessment Pain Assessment: Faces Faces Pain Scale: Hurts a little bit Pain Location: chest Pain Descriptors / Indicators: Sore Pain Intervention(s): Limited activity within patient's tolerance, Monitored during session, Repositioned    Home Living Family/patient expects to be discharged to:: Private residence Living Arrangements: Parent Available Help at Discharge: Family Type of Home: House Home Access: Stairs to enter   Technical brewer of Steps: 1   Home Layout: One level Home Equipment: None Additional Comments: pt's mother walks with a walker or cane    Prior Function Prior Level of Function : Independent/Modified Independent;Working/employed             Mobility Comments: works at [employer]: Right    Extremity/Trunk Assessment  Upper Extremity Assessment Upper Extremity Assessment: Defer to OT evaluation    Lower Extremity Assessment Lower Extremity Assessment: Overall WFL for tasks assessed    Cervical / Trunk Assessment Cervical / Trunk Assessment: Normal  Communication   Communication: No difficulties  Cognition Arousal/Alertness: Awake/alert Behavior During Therapy: Impulsive Overall Cognitive Status:  Impaired/Different from baseline Area of Impairment: Orientation, Attention, Memory, Following commands                 Orientation Level: Disoriented to, Time, Situation Current Attention Level: Sustained Memory: Decreased short-term memory, Decreased recall of precautions Following Commands: Follows one step commands consistently       General Comments: pt oriented to year, not day of the week and says afterwards "it's right there on my phone, I should've known!".pt states her chest was sore because "there was tape or something there" when she is likely sore from chest compression. Pt says "I passed out and I came to and felt fine, but my mom wanted to make sure I am alright". When informed that she cardiac arrest due to an arrhythmia she seemed shocked. Per friend at bedside, memory does appear to be improving as pt initially stated she thought she was in the hospital to have a baby. Pt distractible, checks phone frequently and loses her train of thought        General Comments      Exercises     Assessment/Plan    PT Assessment Patient does not need any further PT services  PT Problem List         PT Treatment Interventions      PT Goals (Current goals can be found in the Care Plan section)  Acute Rehab PT Goals PT Goal Formulation: With patient Time For Goal Achievement: 07/25/22 Potential to Achieve Goals: Good    Frequency       Co-evaluation               AM-PAC PT "6 Clicks" Mobility  Outcome Measure Help needed turning from your back to your side while in a flat bed without using bedrails?: None Help needed moving from lying on your back to sitting on the side of a flat bed without using bedrails?: None Help needed moving to and from a bed to a chair (including a wheelchair)?: None Help needed standing up from a chair using your arms (e.g., wheelchair or bedside chair)?: None Help needed to walk in hospital room?: None Help needed climbing 3-5  steps with a railing? : None 6 Click Score: 24    End of Session   Activity Tolerance: Patient tolerated treatment well Patient left: in bed;with call bell/phone within reach;with family/visitor present Nurse Communication: Mobility status PT Visit Diagnosis: Unsteadiness on feet (R26.81)    Time: 1331-1401 PT Time Calculation (min) (ACUTE ONLY): 30 min   Charges:   PT Evaluation $PT Eval Low Complexity: 1 Low          Tyjay Galindo S, PT DPT Acute Rehabilitation Services Pager [phone]  Office [phone]   Louis Matte 07/25/2022, 2:57 PM

## 2022-07-26 DIAGNOSIS — I469 Cardiac arrest, cause unspecified: Secondary | ICD-10-CM | POA: Diagnosis not present

## 2022-07-26 LAB — COMPREHENSIVE METABOLIC PANEL
ALT: 46 U/L — ABNORMAL HIGH (ref 0–44)
AST: 72 U/L — ABNORMAL HIGH (ref 15–41)
Albumin: 3.7 g/dL (ref 3.5–5.0)
Alkaline Phosphatase: 62 U/L (ref 38–126)
Anion gap: 11 (ref 5–15)
BUN: <5 mg/dL — ABNORMAL LOW (ref 6–20)
CO2: 23 mmol/L (ref 22–32)
Calcium: 8.8 mg/dL — ABNORMAL LOW (ref 8.9–10.3)
Chloride: 105 mmol/L (ref 98–111)
Creatinine, Ser: 1 mg/dL (ref 0.44–1.00)
GFR, Estimated: >60 mL/min (ref >60)
Glucose, Bld: 115 mg/dL — ABNORMAL HIGH (ref 70–99)
Potassium: 3.7 mmol/L (ref 3.5–5.1)
Sodium: 139 mmol/L (ref 135–145)
Total Bilirubin: 0.7 mg/dL (ref 0.3–1.2)
Total Protein: 6.8 g/dL (ref 6.5–8.1)

## 2022-07-26 LAB — PHOSPHORUS: Phosphorus: 3.7 mg/dL (ref 2.5–4.6)

## 2022-07-26 LAB — CBC WITH DIFFERENTIAL/PLATELET
Abs Immature Granulocytes: 0.02 10*3/uL (ref 0.00–0.07)
Basophils Absolute: 0 10*3/uL (ref 0.0–0.1)
Basophils Relative: 0 %
Eosinophils Absolute: 0.1 10*3/uL (ref 0.0–0.5)
Eosinophils Relative: 1 %
HCT: 35.4 % — ABNORMAL LOW (ref 36.0–46.0)
Hemoglobin: 12.1 g/dL (ref 12.0–15.0)
Immature Granulocytes: 0 %
Lymphocytes Relative: 24 %
Lymphs Abs: 1.5 10*3/uL (ref 0.7–4.0)
MCH: 30.6 pg (ref 26.0–34.0)
MCHC: 34.2 g/dL (ref 30.0–36.0)
MCV: 89.4 fL (ref 80.0–100.0)
Monocytes Absolute: 0.4 10*3/uL (ref 0.1–1.0)
Monocytes Relative: 6 %
Neutro Abs: 4.3 10*3/uL (ref 1.7–7.7)
Neutrophils Relative %: 69 %
Platelets: 225 10*3/uL (ref 150–400)
RBC: 3.96 MIL/uL (ref 3.87–5.11)
RDW: 14.3 % (ref 11.5–15.5)
WBC: 6.2 10*3/uL (ref 4.0–10.5)
nRBC: 0 % (ref 0.0–0.2)

## 2022-07-26 LAB — MAGNESIUM: Magnesium: 1.9 mg/dL (ref 1.7–2.4)

## 2022-07-26 MED ORDER — POTASSIUM CHLORIDE CRYS ER 20 MEQ PO TBCR
40.0000 meq | EXTENDED_RELEASE_TABLET | Freq: Once | ORAL | Status: AC
  Administered 2022-07-26: 40 meq via ORAL
  Filled 2022-07-26: qty 2

## 2022-07-26 NOTE — Progress Notes (Signed)
Rounding Note    Patient Name: Alexandra Freeman Date of Encounter: 07/26/2022  [facility] Cardiologist: None   Subjective   No chest pain or sob.   Inpatient Medications    Scheduled Meds:  diclofenac Sodium  2 g Topical QID   docusate sodium  100 mg Oral BID   heparin injection (subcutaneous)  5,000 Units Subcutaneous Q8H   lidocaine  1-2 patch Transdermal Q24H   magnesium oxide  400 mg Oral Daily   sodium chloride flush  3 mL Intravenous Q12H   traZODone  50 mg Oral QHS   Continuous Infusions:  sodium chloride     lactated ringers 75 mL/hr at 07/24/22 2045   promethazine (PHENERGAN) injection (IM or IVPB) Stopped (07/22/22 1227)   PRN Meds: sodium chloride, acetaminophen, docusate sodium, ketorolac, ondansetron (ZOFRAN) IV, mouth rinse, polyethylene glycol, promethazine (PHENERGAN) injection (IM or IVPB) **OR** promethazine, sodium chloride flush   Vital Signs    Vitals:   07/25/22 0611 07/25/22 0859 07/25/22 1136 07/26/22 0548  BP: (!) 119/90 (!) 152/100 (!) 145/95 129/80  Pulse: 78 60 (!) 58 68  Resp: '19 18 18 18  '$ Temp: 98 F (36.7 C) 97.9 F (36.6 C) 97.7 F (36.5 C) 98 F (36.7 C)  TempSrc: Oral Oral Oral Oral  SpO2: 96% 92% 98% 100%  Weight: 69.4 kg   68 kg  Height:        Intake/Output Summary (Last 24 hours) at 07/26/2022 0906 Last data filed at 07/25/2022 2215 Gross per 24 hour  Intake 480 ml  Output --  Net 480 ml      07/26/2022    5:48 AM 07/25/2022    6:11 AM 07/24/2022    4:30 AM  Last 3 Weights  Weight (lbs) 149 lb 14.6 oz 153 lb 153 lb 14.1 oz  Weight (kg) 68 kg 69.4 kg 69.8 kg      Telemetry    nsr - Personally Reviewed  ECG    none - Personally Reviewed  Physical Exam   GEN: No acute distress.   Neck: No JVD Cardiac: RRR, no murmurs, rubs, or gallops.  Respiratory: Clear to auscultation bilaterally. GI: Soft, nontender, non-distended  MS: No edema; No deformity. Neuro:  Nonfocal  Psych: Normal affect    Labs    High Sensitivity Troponin:   Recent Labs  Lab 07/20/22 2234 07/21/22 0015  TROPONINIHS 107* 316*     Chemistry Recent Labs  Lab 07/22/22 1632 07/23/22 0621 07/24/22 0253 07/25/22 0640  NA  --  137 139 139  K  --  3.1* 3.7 3.4*  CL  --  106 110 110  CO2  --  23 21* 20*  GLUCOSE  --  83 82 83  BUN  --  6 5* 5*  CREATININE  --  0.91 0.87 0.92  CALCIUM  --  8.1* 8.3* 8.5*  MG 2.0 1.9  --  1.8  PROT  --  6.2* 6.1* 6.2*  ALBUMIN  --  3.3* 3.2* 3.2*  AST  --  256* 187* 107*  ALT  --  73* 65* 53*  ALKPHOS  --  68 62 54  BILITOT  --  0.5 0.7 0.5  GFRNONAA  --  >60 >60 >60  ANIONGAP  --  '8 8 9    '$ Lipids  Recent Labs  Lab 07/22/22 0453  TRIG 48    Hematology Recent Labs  Lab 07/22/22 0453 07/23/22 0621 07/25/22 0640  WBC 12.2* 9.6 6.8  RBC 3.53* 3.55* 3.49*  HGB 10.8* 10.8* 10.8*  HCT 32.9* 33.0* 31.6*  MCV 93.2 93.0 90.5  MCH 30.6 30.4 30.9  MCHC 32.8 32.7 34.2  RDW 14.7 14.5 14.1  PLT 147* 161 173   Thyroid No results for input(s): "TSH", "FREET4" in the last 168 hours.  BNPNo results for input(s): "BNP", "PROBNP" in the last 168 hours.  DDimer No results for input(s): "DDIMER" in the last 168 hours.   Radiology    No results found.  Cardiac Studies   See above  Patient Profile     27 y.o. female admitted with VF arrest, and initial encephalopathy  Assessment & Plan    VF arrest - she is pending cMRI which will need to be done with anesthesia due to severe anxiety. Likely S-ICD on Tuesday. Mild LV dysfunction - unclear if due to the arrest or something else.    Encephalopathy - resolved.   For questions or updates, please contact [place] Please consult [email or web address] for contact info under    Signed, Cristopher Peru, MD  07/26/2022, 9:06 AM

## 2022-07-26 NOTE — Progress Notes (Signed)
PROGRESS NOTE    Alexandra Freeman  [id number] DOB: [birth year] DOA: 07/20/2022 PCP: Patient, No Pcp Per  Chief Complaint  Patient presents with   POST CPR    Brief Narrative:   Patient is Alexandra Freeman 27 year old female pertinent PMH menorrhagia on birth control presents to [facility] ED on 2/18 cardiac arrest.   Per EMS, patient was at home-- had smoking weed with family earlier that night (pt and fam smoked, not just pt) and then was sitting on couch laughing w moms when suddenly pt slumped over. 1 mom checked pulse and was pulseless, started chest compressions. Other mom called EMS. Down for approximately 6 to 7 minutes with no CPR prior to EMS arrival.  Patient found to be in V-fib arrest, defibrillated, and received 11 minutes of CPR until ROSC. Patient post ROSC in vtach was then cardioverted and given amio with sinus rhythm. Patient transported to [facility].  In route to [facility], patient biting at igel supraglottic airway and was given 50 mics of fentanyl.   Upon arrival to [facility] ED, patient vitals stable moving extremities.  Temp 95.1 F.  Airway exchanged for ETT. CXR showing ETT in good position. CTA chest without PE. CT head no acute abnormality. EKG qtc 495, no ST elevations. ABG 7.24, 41, 191, 18.  Potassium 3.4, glucose 185, creatinine 1.28, calcium 8.3.  Troponin 107.  Ethanol WNL.  UDS positive for THC.  UA unremarkable.  PCCM consulted for ICU admission.    Assessment & Plan:   Principal Problem:   Cardiac arrest [facility]) Active Problems:   Acute respiratory failure (HCC)   AKI (acute kidney injury) ([place])   Aspiration pneumonia (HCC)   Encephalopathy acute   Nausea and vomiting   Hypokalemia   Hypophosphatemia   Acute metabolic encephalopathy   Ventricular fibrillation (HCC)  Witnessed VF arrest  - unclear cause.  did get immediate bystander CPR. about 17-18 min downtime total. - plan for cardiac MRI under anesthesia followed by defibrillator  - eventual ICD, but needs improved mental  status first - plan for ICD prior to discharge - LHC 2/21 with normal coronary arteries - echo 2/19 with EF 50-55%, no RWMA, mildly elevated PASP, dilated IVC with <50% resp variability - no acute intracranial process - no fam hx of sudden cardiac death  - lytes K>4 mag>2  Acute encephalopathy  - meds, ICU delirium, probably some component of anoxic injury  - MRI 2/21 motion limited without definite evidence of hypoxic/ischemic injury - consider repeat MRI (might discuss doing this under anesthesia with planned cardiac MRI, will follow with family) - SLP cognitive eval.  PT/OT. - will need outpatient neurology   Possible aspiration  Completed short course unasyn   Transaminitis   most likely in setting of arrest downtrending   Elevated trops  Per cards, w/u for arrest as noted above LHC without CAD  N/v  resolved   Anemia, stable -PRN CBC   Hypophosphatemia Hypokalemia  P -replace -Cont to trend   Insomnia Trazodone    DVT prophylaxis: heparin  Code Status: full Family Communication: mom Disposition:   Status is: Inpatient Remains inpatient appropriate because: pending cardiac workup   Consultants:  cardiology  Procedures:  LHC 1.  Normal coronary arteries. 2.  Left ventricular angiography was not performed.  EF was 50 to 55% by echo. 3.  Mildly elevated left ventricular end-diastolic pressure at 14 mmHg.   Recommendations: No ischemic etiology is identified for cardiac arrest.  Echo IMPRESSIONS  1. Left ventricular ejection fraction, by estimation, is 50 to 55%. The  left ventricle has low normal function. The left ventricle has no regional  wall motion abnormalities. Left ventricular diastolic parameters were  normal.   2. Right ventricular systolic function is normal. The right ventricular  size is normal. There is mildly elevated pulmonary artery systolic  pressure. The estimated right ventricular systolic pressure is [id number] mmHg.   3. The  mitral valve is normal in structure. No evidence of mitral valve  regurgitation. No evidence of mitral stenosis.   4. The aortic valve is tricuspid. Aortic valve regurgitation is not  visualized. No aortic stenosis is present.   5. The inferior vena cava is dilated in size with <50% respiratory  variability, suggesting right atrial pressure of 15 mmHg.   Antimicrobials:  Anti-infectives (From admission, onward)    Start     Dose/Rate Route Frequency Ordered Stop   07/21/22 1300  Ampicillin-Sulbactam (UNASYN) 3 g in sodium chloride 0.9 % 100 mL IVPB        3 g 200 mL/hr over 30 Minutes Intravenous Every 8 hours 07/21/22 1206 07/24/22 0522       Subjective: No complaints Mother, grandmother, grandfather, great grandmother at bedside  Objective: Vitals:   07/24/22 2006 07/25/22 0611 07/25/22 0859 07/25/22 1136  BP: (!) 142/98 (!) 119/90 (!) 152/100 (!) 145/95  Pulse: 72 78 60 (!) 58  Resp: '20 19 18 18  '$ Temp: 98 F (36.7 C) 98 F (36.7 C) 97.9 F (36.6 C) 97.7 F (36.5 C)  TempSrc: Oral Oral Oral Oral  SpO2: 100% 96% 92% 98%  Weight:  69.4 kg    Height:        Intake/Output Summary (Last 24 hours) at 07/25/2022 1723 Last data filed at 07/24/2022 1926 Gross per 24 hour  Intake 400.4 ml  Output --  Net 400.4 ml   Filed Weights   07/23/22 1020 07/24/22 0430 07/25/22 0611  Weight: 71.7 kg 69.8 kg 69.4 kg    Examination:  General: No acute distress. Lungs: unlabored Neurological: Alert. Moves all extremities 4 with equal strength. Cranial nerves II through XII grossly intact. Extremities: No clubbing or cyanosis. No edema.  Data Reviewed: I have personally reviewed following labs and imaging studies  CBC: Recent Labs  Lab 07/20/22 2234 07/20/22 2315 07/21/22 0015 07/22/22 0453 07/23/22 0621 07/25/22 0640  WBC 10.3  --  21.6* 12.2* 9.6 6.8  NEUTROABS 6.7  --   --   --   --  4.8  HGB 12.4  13.6 12.9 12.4 10.8* 10.8* 10.8*  HCT 37.4  40.0 38.0 39.8 32.9*  33.0* 31.6*  MCV 93.0  --  95.2 93.2 93.0 90.5  PLT 217  --  257 147* 161 [id number]    Basic Metabolic Panel: Recent Labs  Lab 07/21/22 0749 07/21/22 1748 07/22/22 0453 07/22/22 1632 07/23/22 0621 07/24/22 0253 07/25/22 0640  NA 135  --  138  --  137 139 139  K 4.3  --  4.4  --  3.1* 3.7 3.4*  CL 111  --  110  --  106 110 110  CO2 19*  --  21*  --  23 21* 20*  GLUCOSE 111*  --  108*  --  83 82 83  BUN 9  --  8  --  6 5* 5*  CREATININE 0.79  --  0.77  --  0.91 0.87 0.92  CALCIUM 8.0*  --  8.3*  --  8.1*  8.3* 8.5*  MG 2.1 2.0 2.1 2.0 1.9  --  1.8  PHOS  --  2.8 3.1 2.1* 2.5  --  3.3    GFR: Estimated Creatinine Clearance: 89.9 mL/min (by C-G formula based on SCr of 0.92 mg/dL).  Liver Function Tests: Recent Labs  Lab 07/20/22 2234 07/21/22 0015 07/23/22 0621 07/24/22 0253 07/25/22 0640  AST 141* 145* 256* 187* 107*  ALT 92* 88* 73* 65* 53*  ALKPHOS 88 88 68 62 54  BILITOT 0.4 0.8 0.5 0.7 0.5  PROT 7.0 6.8 6.2* 6.1* 6.2*  ALBUMIN 3.7 3.8 3.3* 3.2* 3.2*    CBG: Recent Labs  Lab 07/21/22 1945 07/21/22 2326 07/22/22 0411 07/22/22 0800 07/22/22 1141  GLUCAP 106* 115* 104* 110* 108*     Recent Results (from the past 240 hour(s))  MRSA Next Gen by PCR, Nasal     Status: None   Collection Time: 07/21/22  1:01 AM   Specimen: Nasal Mucosa; Nasal Swab  Result Value Ref Range Status   MRSA by PCR Next Gen NOT DETECTED NOT DETECTED Final    Comment: (NOTE) The GeneXpert MRSA Assay (FDA approved for NASAL specimens only), is one component of Kiyara Bouffard comprehensive MRSA colonization surveillance program. It is not intended to diagnose MRSA infection nor to guide or monitor treatment for MRSA infections. Test performance is not FDA approved in patients less than 37 years old. Performed at [facility], [place] [address], [place], [place] [address]   Resp panel by RT-PCR (RSV, Flu Miley Lindon&B, Covid) Anterior Nasal Swab     Status: None   Collection Time: 07/21/22  2:09 AM    Specimen: Anterior Nasal Swab  Result Value Ref Range Status   SARS Coronavirus 2 by RT PCR NEGATIVE NEGATIVE Final   Influenza Jakory Matsuo by PCR NEGATIVE NEGATIVE Final   Influenza B by PCR NEGATIVE NEGATIVE Final    Comment: (NOTE) The Xpert Xpress SARS-CoV-2/FLU/RSV plus assay is intended as an aid in the diagnosis of influenza from Nasopharyngeal swab specimens and should not be used as Jamir Rone sole basis for treatment. Nasal washings and aspirates are unacceptable for Xpert Xpress SARS-CoV-2/FLU/RSV testing.  Fact Sheet for Patients: EntrepreneurPulse.com.au  Fact Sheet for Healthcare Providers: IncredibleEmployment.be  This test is not yet approved or cleared by the [place] FDA and has been authorized for detection and/or diagnosis of SARS-CoV-2 by FDA under an Emergency Use Authorization (EUA). This EUA will remain in effect (meaning this test can be used) for the duration of the COVID-19 declaration under Section 564(b)(1) of the Act, 21 U.S.C. section 360bbb-3(b)(1), unless the authorization is terminated or revoked.     Resp Syncytial Virus by PCR NEGATIVE NEGATIVE Final    Comment: (NOTE) Fact Sheet for Patients: EntrepreneurPulse.com.au  Fact Sheet for Healthcare Providers: IncredibleEmployment.be  This test is not yet approved or cleared by the [place] FDA and has been authorized for detection and/or diagnosis of SARS-CoV-2 by FDA under an Emergency Use Authorization (EUA). This EUA will remain in effect (meaning this test can be used) for the duration of the COVID-19 declaration under Section 564(b)(1) of the Act, 21 U.S.C. section 360bbb-3(b)(1), unless the authorization is terminated or revoked.  Performed at [facility], [place] [address] [place], [place] [address]          Radiology Studies: No results found.      Scheduled Meds:  diclofenac Sodium  2 g Topical QID    docusate sodium  100 mg Oral  BID   heparin injection (subcutaneous)  5,000 Units Subcutaneous Q8H   lidocaine  1-2 patch Transdermal Q24H   magnesium oxide  400 mg Oral Daily   sodium chloride flush  3 mL Intravenous Q12H   Continuous Infusions:  sodium chloride     lactated ringers 75 mL/hr at 07/24/22 2045   promethazine (PHENERGAN) injection (IM or IVPB) Stopped (07/22/22 1227)     LOS: 4 days    Time spent: over 30 min    Fayrene Helper, MD [facility]   To contact the attending provider between 7A-7P or the covering provider during after hours 7P-7A, please log into the web site [email or web address] and access using universal [place] password for that web site. If you do not have the password, please call the hospital operator.  07/25/2022, 5:23 PM

## 2022-07-27 DIAGNOSIS — I469 Cardiac arrest, cause unspecified: Secondary | ICD-10-CM | POA: Diagnosis not present

## 2022-07-27 LAB — COMPREHENSIVE METABOLIC PANEL
ALT: 38 U/L (ref 0–44)
AST: 49 U/L — ABNORMAL HIGH (ref 15–41)
Albumin: 3.3 g/dL — ABNORMAL LOW (ref 3.5–5.0)
Alkaline Phosphatase: 53 U/L (ref 38–126)
Anion gap: 7 (ref 5–15)
BUN: 7 mg/dL (ref 6–20)
CO2: 24 mmol/L (ref 22–32)
Calcium: 8.5 mg/dL — ABNORMAL LOW (ref 8.9–10.3)
Chloride: 106 mmol/L (ref 98–111)
Creatinine, Ser: 1.08 mg/dL — ABNORMAL HIGH (ref 0.44–1.00)
GFR, Estimated: >60 mL/min (ref >60)
Glucose, Bld: 90 mg/dL (ref 70–99)
Potassium: 4.1 mmol/L (ref 3.5–5.1)
Sodium: 137 mmol/L (ref 135–145)
Total Bilirubin: 0.5 mg/dL (ref 0.3–1.2)
Total Protein: 6.1 g/dL — ABNORMAL LOW (ref 6.5–8.1)

## 2022-07-27 LAB — MAGNESIUM: Magnesium: 1.9 mg/dL (ref 1.7–2.4)

## 2022-07-27 MED ORDER — ACETAMINOPHEN 325 MG PO TABS
650.0000 mg | ORAL_TABLET | ORAL | Status: DC | PRN
  Administered 2022-07-27 – 2022-07-31 (×5): 650 mg via ORAL
  Filled 2022-07-27 (×6): qty 2

## 2022-07-27 NOTE — Progress Notes (Signed)
PROGRESS NOTE    Alexandra Freeman  [id number] DOB: [birth year] DOA: 07/20/2022 PCP: Patient, No Pcp Per  Chief Complaint  Patient presents with   POST CPR    Brief Narrative:   Patient is Alexandra Freeman 27 year old female pertinent PMH menorrhagia on birth control presents to [facility] ED on 2/18 cardiac arrest.   Per EMS, patient was at home-- had smoking weed with family earlier that night (pt and fam smoked, not just pt) and then was sitting on couch laughing w moms when suddenly pt slumped over. 1 mom checked pulse and was pulseless, started chest compressions. Other mom called EMS. Down for approximately 6 to 7 minutes with no CPR prior to EMS arrival.  Patient found to be in V-fib arrest, defibrillated, and received 11 minutes of CPR until ROSC. Patient post ROSC in vtach was then cardioverted and given amio with sinus rhythm. Patient transported to [facility].  In route to [facility], patient biting at igel supraglottic airway and was given 50 mics of fentanyl.   Upon arrival to [facility] ED, patient vitals stable moving extremities.  Temp 95.1 F.  Airway exchanged for ETT. CXR showing ETT in good position. CTA chest without PE. CT head no acute abnormality. EKG qtc 495, no ST elevations. ABG 7.24, 41, 191, 18.  Potassium 3.4, glucose 185, creatinine 1.28, calcium 8.3.  Troponin 107.  Ethanol WNL.  UDS positive for THC.  UA unremarkable.  PCCM consulted for ICU admission.    Assessment & Plan:   Principal Problem:   Cardiac arrest [facility]) Active Problems:   Acute respiratory failure (HCC)   AKI (acute kidney injury) ([place])   Aspiration pneumonia (HCC)   Encephalopathy acute   Nausea and vomiting   Hypokalemia   Hypophosphatemia   Acute metabolic encephalopathy   Ventricular fibrillation (HCC)  Witnessed VF arrest  - unclear cause.  did get immediate bystander CPR. about 17-18 min downtime total. - plan for cardiac MRI under anesthesia followed by defibrillator  - eventual ICD, but needs improved mental  status first - plan for ICD prior to discharge - LHC 2/21 with normal coronary arteries - echo 2/19 with EF 50-55%, no RWMA, mildly elevated PASP, dilated IVC with <50% resp variability - no acute intracranial process - no fam hx of sudden cardiac death  - lytes K>4 mag>2  Acute encephalopathy  - meds, ICU delirium, probably some component of anoxic injury  - MRI 2/21 motion limited without definite evidence of hypoxic/ischemic injury - consider repeat MRI (might discuss doing this under anesthesia with planned cardiac MRI, will follow with family) - SLP cognitive eval.  PT/OT. - will need outpatient neurology   Possible aspiration  Completed short course unasyn   Transaminitis   most likely in setting of arrest downtrending   Elevated trops  Per cards, w/u for arrest as noted above LHC without CAD  N/v  resolved   Anemia, stable -PRN CBC   Hypophosphatemia Hypokalemia  P -replace -Cont to trend   Insomnia Trazodone    DVT prophylaxis: heparin  Code Status: full Family Communication: mom Disposition:   Status is: Inpatient Remains inpatient appropriate because: pending cardiac workup   Consultants:  cardiology  Procedures:  LHC 1.  Normal coronary arteries. 2.  Left ventricular angiography was not performed.  EF was 50 to 55% by echo. 3.  Mildly elevated left ventricular end-diastolic pressure at 14 mmHg.   Recommendations: No ischemic etiology is identified for cardiac arrest.  Echo IMPRESSIONS  1. Left ventricular ejection fraction, by estimation, is 50 to 55%. The  left ventricle has low normal function. The left ventricle has no regional  wall motion abnormalities. Left ventricular diastolic parameters were  normal.   2. Right ventricular systolic function is normal. The right ventricular  size is normal. There is mildly elevated pulmonary artery systolic  pressure. The estimated right ventricular systolic pressure is [id number] mmHg.   3. The  mitral valve is normal in structure. No evidence of mitral valve  regurgitation. No evidence of mitral stenosis.   4. The aortic valve is tricuspid. Aortic valve regurgitation is not  visualized. No aortic stenosis is present.   5. The inferior vena cava is dilated in size with <50% respiratory  variability, suggesting right atrial pressure of 15 mmHg.   Antimicrobials:  Anti-infectives (From admission, onward)    Start     Dose/Rate Route Frequency Ordered Stop   07/21/22 1300  Ampicillin-Sulbactam (UNASYN) 3 g in sodium chloride 0.9 % 100 mL IVPB        3 g 200 mL/hr over 30 Minutes Intravenous Every 8 hours 07/21/22 1206 07/24/22 0522       Subjective: No complaints  Objective: Vitals:   07/27/22 0613 07/27/22 0900 07/27/22 1142 07/27/22 1656  BP: 107/79 131/87 128/78 (!) 119/91  Pulse: 63 85 67 89  Resp: '18 18 15 18  '$ Temp: 98.1 F (36.7 C) 98.7 F (37.1 C) 98.1 F (36.7 C) 98.3 F (36.8 C)  TempSrc: Oral Oral Oral Oral  SpO2: 99% 99%  98%  Weight: 70 kg     Height:        Intake/Output Summary (Last 24 hours) at 07/27/2022 1723 Last data filed at 07/26/2022 2154 Gross per 24 hour  Intake 480 ml  Output --  Net 480 ml   Filed Weights   07/25/22 0611 07/26/22 0548 07/27/22 0613  Weight: 69.4 kg 68 kg 70 kg    Examination:  General: No acute distress. Lungs: unlabored Neurological: Alert and oriented 3. Moves all extremities 4 with equal strength. Cranial nerves II through XII grossly intact. Extremities: No clubbing or cyanosis. No edema.   Data Reviewed: I have personally reviewed following labs and imaging studies  CBC: Recent Labs  Lab 07/20/22 2234 07/20/22 2315 07/21/22 0015 07/22/22 0453 07/23/22 0621 07/25/22 0640 07/26/22 1036  WBC 10.3  --  21.6* 12.2* 9.6 6.8 6.2  NEUTROABS 6.7  --   --   --   --  4.8 4.3  HGB 12.4  13.6   < > 12.4 10.8* 10.8* 10.8* 12.1  HCT 37.4  40.0   < > 39.8 32.9* 33.0* 31.6* 35.4*  MCV 93.0  --  95.2 93.2  93.0 90.5 89.4  PLT 217  --  257 147* 161 173 225   < > = values in this interval not displayed.    Basic Metabolic Panel: Recent Labs  Lab 07/22/22 0453 07/22/22 1632 07/23/22 0621 07/24/22 0253 07/25/22 0640 07/26/22 1036 07/27/22 0622  NA 138  --  137 139 139 139 137  K 4.4  --  3.1* 3.7 3.4* 3.7 4.1  CL 110  --  106 110 110 105 106  CO2 21*  --  23 21* 20* 23 24  GLUCOSE 108*  --  83 82 83 115* 90  BUN 8  --  6 5* 5* <5* 7  CREATININE 0.77  --  0.91 0.87 0.92 1.00 1.08*  CALCIUM 8.3*  --  8.1*  8.3* 8.5* 8.8* 8.5*  MG 2.1 2.0 1.9  --  1.8 1.9 1.9  PHOS 3.1 2.1* 2.5  --  3.3 3.7  --     GFR: Estimated Creatinine Clearance: 76.8 mL/min (Eda Magnussen) (by C-G formula based on SCr of 1.08 mg/dL (H)).  Liver Function Tests: Recent Labs  Lab 07/23/22 0621 07/24/22 0253 07/25/22 0640 07/26/22 1036 07/27/22 0622  AST 256* 187* 107* 72* 49*  ALT 73* 65* 53* 46* 38  ALKPHOS 68 62 54 62 53  BILITOT 0.5 0.7 0.5 0.7 0.5  PROT 6.2* 6.1* 6.2* 6.8 6.1*  ALBUMIN 3.3* 3.2* 3.2* 3.7 3.3*    CBG: Recent Labs  Lab 07/21/22 1945 07/21/22 2326 07/22/22 0411 07/22/22 0800 07/22/22 1141  GLUCAP 106* 115* 104* 110* 108*     Recent Results (from the past 240 hour(s))  MRSA Next Gen by PCR, Nasal     Status: None   Collection Time: 07/21/22  1:01 AM   Specimen: Nasal Mucosa; Nasal Swab  Result Value Ref Range Status   MRSA by PCR Next Gen NOT DETECTED NOT DETECTED Final    Comment: (NOTE) The GeneXpert MRSA Assay (FDA approved for NASAL specimens only), is one component of Pride Gonzales comprehensive MRSA colonization surveillance program. It is not intended to diagnose MRSA infection nor to guide or monitor treatment for MRSA infections. Test performance is not FDA approved in patients less than 42 years old. Performed at [facility], [place] [address], [place], [place] [address]   Resp panel by RT-PCR (RSV, Flu Ventura Leggitt&B, Covid) Anterior Nasal Swab     Status: None   Collection Time:  07/21/22  2:09 AM   Specimen: Anterior Nasal Swab  Result Value Ref Range Status   SARS Coronavirus 2 by RT PCR NEGATIVE NEGATIVE Final   Influenza Gerhard Rappaport by PCR NEGATIVE NEGATIVE Final   Influenza B by PCR NEGATIVE NEGATIVE Final    Comment: (NOTE) The Xpert Xpress SARS-CoV-2/FLU/RSV plus assay is intended as an aid in the diagnosis of influenza from Nasopharyngeal swab specimens and should not be used as Emry Barbato sole basis for treatment. Nasal washings and aspirates are unacceptable for Xpert Xpress SARS-CoV-2/FLU/RSV testing.  Fact Sheet for Patients: EntrepreneurPulse.com.au  Fact Sheet for Healthcare Providers: IncredibleEmployment.be  This test is not yet approved or cleared by the [place] FDA and has been authorized for detection and/or diagnosis of SARS-CoV-2 by FDA under an Emergency Use Authorization (EUA). This EUA will remain in effect (meaning this test can be used) for the duration of the COVID-19 declaration under Section 564(b)(1) of the Act, 21 U.S.C. section 360bbb-3(b)(1), unless the authorization is terminated or revoked.     Resp Syncytial Virus by PCR NEGATIVE NEGATIVE Final    Comment: (NOTE) Fact Sheet for Patients: EntrepreneurPulse.com.au  Fact Sheet for Healthcare Providers: IncredibleEmployment.be  This test is not yet approved or cleared by the [place] FDA and has been authorized for detection and/or diagnosis of SARS-CoV-2 by FDA under an Emergency Use Authorization (EUA). This EUA will remain in effect (meaning this test can be used) for the duration of the COVID-19 declaration under Section 564(b)(1) of the Act, 21 U.S.C. section 360bbb-3(b)(1), unless the authorization is terminated or revoked.  Performed at [facility], [place] [address], [place], [place] [address]          Radiology Studies: No results found.      Scheduled Meds:  diclofenac Sodium   2 g Topical QID   docusate sodium  100 mg Oral BID   heparin injection (subcutaneous)  5,000 Units Subcutaneous Q8H   lidocaine  1-2 patch Transdermal Q24H   magnesium oxide  400 mg Oral Daily   sodium chloride flush  3 mL Intravenous Q12H   traZODone  50 mg Oral QHS   Continuous Infusions:  sodium chloride     lactated ringers 75 mL/hr at 07/24/22 2045   promethazine (PHENERGAN) injection (IM or IVPB) Stopped (07/22/22 1227)     LOS: 6 days    Time spent: over 30 min    Fayrene Helper, MD [facility]   To contact the attending provider between 7A-7P or the covering provider during after hours 7P-7A, please log into the web site [email or web address] and access using universal [place] password for that web site. If you do not have the password, please call the hospital operator.  07/27/2022, 5:23 PM

## 2022-07-27 NOTE — Progress Notes (Signed)
Rounding Note    Patient Name: Alexandra Freeman Date of Encounter: 07/27/2022  [facility] Cardiologist: None   Subjective   No chest pain or sob.   Inpatient Medications    Scheduled Meds:  diclofenac Sodium  2 g Topical QID   docusate sodium  100 mg Oral BID   heparin injection (subcutaneous)  5,000 Units Subcutaneous Q8H   lidocaine  1-2 patch Transdermal Q24H   magnesium oxide  400 mg Oral Daily   sodium chloride flush  3 mL Intravenous Q12H   traZODone  50 mg Oral QHS   Continuous Infusions:  sodium chloride     lactated ringers 75 mL/hr at 07/24/22 2045   promethazine (PHENERGAN) injection (IM or IVPB) Stopped (07/22/22 1227)   PRN Meds: sodium chloride, acetaminophen, docusate sodium, ketorolac, ondansetron (ZOFRAN) IV, mouth rinse, polyethylene glycol, promethazine (PHENERGAN) injection (IM or IVPB) **OR** promethazine, sodium chloride flush   Vital Signs    Vitals:   07/26/22 1700 07/27/22 0100 07/27/22 0613 07/27/22 0900  BP: (!) 127/92 110/82 107/79 131/87  Pulse: 79 85 63 85  Resp: '16 19 18 18  '$ Temp: 98.7 F (37.1 C)  98.1 F (36.7 C) 98.7 F (37.1 C)  TempSrc: Oral  Oral Oral  SpO2: 94% 98% 99% 99%  Weight:   70 kg   Height:        Intake/Output Summary (Last 24 hours) at 07/27/2022 1129 Last data filed at 07/26/2022 2154 Gross per 24 hour  Intake 480 ml  Output --  Net 480 ml      07/27/2022    6:13 AM 07/26/2022    5:48 AM 07/25/2022    6:11 AM  Last 3 Weights  Weight (lbs) 154 lb 5.2 oz 149 lb 14.6 oz 153 lb  Weight (kg) 70 kg 68 kg 69.4 kg      Telemetry    nsr - Personally Reviewed  ECG    none - Personally Reviewed  Physical Exam   GEN: No acute distress.   Neck: No JVD Cardiac: RRR, no murmurs, rubs, or gallops.  Respiratory: Clear to auscultation bilaterally. GI: Soft, nontender, non-distended  MS: No edema; No deformity. Neuro:  Nonfocal  Psych: Normal affect   Labs    High Sensitivity Troponin:    Recent Labs  Lab 07/20/22 2234 07/21/22 0015  TROPONINIHS 107* 316*     Chemistry Recent Labs  Lab 07/25/22 0640 07/26/22 1036 07/27/22 0622  NA 139 139 137  K 3.4* 3.7 4.1  CL 110 105 106  CO2 20* 23 24  GLUCOSE 83 115* 90  BUN 5* <5* 7  CREATININE 0.92 1.00 1.08*  CALCIUM 8.5* 8.8* 8.5*  MG 1.8 1.9 1.9  PROT 6.2* 6.8 6.1*  ALBUMIN 3.2* 3.7 3.3*  AST 107* 72* 49*  ALT 53* 46* 38  ALKPHOS 54 62 53  BILITOT 0.5 0.7 0.5  GFRNONAA >60 >60 >60  ANIONGAP '9 11 7    '$ Lipids  Recent Labs  Lab 07/22/22 0453  TRIG 48    Hematology Recent Labs  Lab 07/23/22 0621 07/25/22 0640 07/26/22 1036  WBC 9.6 6.8 6.2  RBC 3.55* 3.49* 3.96  HGB 10.8* 10.8* 12.1  HCT 33.0* 31.6* 35.4*  MCV 93.0 90.5 89.4  MCH 30.4 30.9 30.6  MCHC 32.7 34.2 34.2  RDW 14.5 14.1 14.3  PLT 161 173 225   Thyroid No results for input(s): "TSH", "FREET4" in the last 168 hours.  BNPNo results for input(s): "BNP", "  PROBNP" in the last 168 hours.  DDimer No results for input(s): "DDIMER" in the last 168 hours.   Radiology    No results found.  Cardiac Studies   none  Patient Profile     27 y.o. female admitted with VF arrest.   Assessment & Plan    VF arrest - she could not undergo MRI last week and is awaiting anesthesia MRI this week before undergoing S-ICD.      For questions or updates, please contact [place] Please consult [email or web address] for contact info under   Signed, Cristopher Peru, MD  07/27/2022, 11:30 AM

## 2022-07-28 DIAGNOSIS — I469 Cardiac arrest, cause unspecified: Secondary | ICD-10-CM | POA: Diagnosis not present

## 2022-07-28 LAB — BASIC METABOLIC PANEL
Anion gap: 8 (ref 5–15)
BUN: 11 mg/dL (ref 6–20)
CO2: 22 mmol/L (ref 22–32)
Calcium: 8.6 mg/dL — ABNORMAL LOW (ref 8.9–10.3)
Chloride: 107 mmol/L (ref 98–111)
Creatinine, Ser: 0.86 mg/dL (ref 0.44–1.00)
GFR, Estimated: >60 mL/min (ref >60)
Glucose, Bld: 90 mg/dL (ref 70–99)
Potassium: 3.5 mmol/L (ref 3.5–5.1)
Sodium: 137 mmol/L (ref 135–145)

## 2022-07-28 LAB — MAGNESIUM: Magnesium: 2 mg/dL (ref 1.7–2.4)

## 2022-07-28 NOTE — Progress Notes (Signed)
PROGRESS NOTE    Alexandra Freeman  [id number] DOB: [birth year] DOA: 07/20/2022 PCP: Patient, No Pcp Per  Chief Complaint  Patient presents with   POST CPR    Brief Narrative:   Patient is Alexandra Freeman 27 year old female pertinent PMH menorrhagia on birth control presents to [facility] ED on 2/18 cardiac arrest.   Per EMS, patient was at home-- had smoking weed with family earlier that night (pt and fam smoked, not just pt) and then was sitting on couch laughing w moms when suddenly pt slumped over. 1 mom checked pulse and was pulseless, started chest compressions. Other mom called EMS. Down for approximately 6 to 7 minutes with no CPR prior to EMS arrival.  Patient found to be in V-fib arrest, defibrillated, and received 11 minutes of CPR until ROSC. Patient post ROSC in vtach was then cardioverted and given amio with sinus rhythm. Patient transported to [facility].  In route to [facility], patient biting at igel supraglottic airway and was given 50 mics of fentanyl.   Upon arrival to [facility] ED, patient vitals stable moving extremities.  Temp 95.1 F.  Airway exchanged for ETT. CXR showing ETT in good position. CTA chest without PE. CT head no acute abnormality. EKG qtc 495, no ST elevations. ABG 7.24, 41, 191, 18.  Potassium 3.4, glucose 185, creatinine 1.28, calcium 8.3.  Troponin 107.  Ethanol WNL.  UDS positive for THC.  UA unremarkable.  PCCM consulted for ICU admission.    Assessment & Plan:   Principal Problem:   Cardiac arrest [facility]) Active Problems:   Acute respiratory failure (HCC)   AKI (acute kidney injury) ([place])   Aspiration pneumonia (HCC)   Encephalopathy acute   Nausea and vomiting   Hypokalemia   Hypophosphatemia   Acute metabolic encephalopathy   Ventricular fibrillation (HCC)  Witnessed VF arrest  - unclear cause.  did get immediate bystander CPR. about 17-18 min downtime total. - plan for cardiac MRI under anesthesia followed by defibrillator  - eventual ICD, but needs improved mental  status first - plan for ICD prior to discharge - LHC 2/21 with normal coronary arteries - echo 2/19 with EF 50-55%, no RWMA, mildly elevated PASP, dilated IVC with <50% resp variability - no acute intracranial process - no fam hx of sudden cardiac death  - lytes K>4 mag>2  Acute encephalopathy  - meds, ICU delirium, probably some component of anoxic injury  - MRI 2/21 motion limited without definite evidence of hypoxic/ischemic injury - consider repeat MRI (might discuss doing this under anesthesia with planned cardiac MRI, will follow with family) - SLP cognitive eval.  PT/OT. - will need outpatient neurology   Possible aspiration  Completed short course unasyn   Transaminitis   most likely in setting of arrest downtrending   Elevated trops  Per cards, w/u for arrest as noted above LHC without CAD  N/v  resolved   Anemia, stable -PRN CBC   Hypophosphatemia Hypokalemia  P -replace -Cont to trend   Insomnia Trazodone    DVT prophylaxis: frequent ambulation, d/c heparin due to her request - she's up and walking frequently   Code Status: full Family Communication: mom Disposition:   Status is: Inpatient Remains inpatient appropriate because: pending cardiac workup   Consultants:  cardiology  Procedures:  LHC 1.  Normal coronary arteries. 2.  Left ventricular angiography was not performed.  EF was 50 to 55% by echo. 3.  Mildly elevated left ventricular end-diastolic pressure at 14 mmHg.   Recommendations: No  ischemic etiology is identified for cardiac arrest.  Echo IMPRESSIONS     1. Left ventricular ejection fraction, by estimation, is 50 to 55%. The  left ventricle has low normal function. The left ventricle has no regional  wall motion abnormalities. Left ventricular diastolic parameters were  normal.   2. Right ventricular systolic function is normal. The right ventricular  size is normal. There is mildly elevated pulmonary artery systolic   pressure. The estimated right ventricular systolic pressure is [id number] mmHg.   3. The mitral valve is normal in structure. No evidence of mitral valve  regurgitation. No evidence of mitral stenosis.   4. The aortic valve is tricuspid. Aortic valve regurgitation is not  visualized. No aortic stenosis is present.   5. The inferior vena cava is dilated in size with <50% respiratory  variability, suggesting right atrial pressure of 15 mmHg.   Antimicrobials:  Anti-infectives (From admission, onward)    Start     Dose/Rate Route Frequency Ordered Stop   07/21/22 1300  Ampicillin-Sulbactam (UNASYN) 3 g in sodium chloride 0.9 % 100 mL IVPB        3 g 200 mL/hr over 30 Minutes Intravenous Every 8 hours 07/21/22 1206 07/24/22 0522       Subjective: No complaints  Objective: Vitals:   07/27/22 2037 07/28/22 0628 07/28/22 1000 07/28/22 1215  BP: 130/89 113/72  128/71  Pulse: (!) 58 61  65  Resp: '18 18  18  '$ Temp: 97.7 F (36.5 C) 98.2 F (36.8 C)  98 F (36.7 C)  TempSrc: Oral Oral  Oral  SpO2: 99% 98%    Weight:   65.9 kg   Height:        Intake/Output Summary (Last 24 hours) at 07/28/2022 1450 Last data filed at 07/27/2022 2221 Gross per 24 hour  Intake 6 ml  Output --  Net 6 ml   Filed Weights   07/26/22 0548 07/27/22 0613 07/28/22 1000  Weight: 68 kg 70 kg 65.9 kg    Examination:  General: No acute distress. Lungs: unlabored Neurological: Alert. Moves all extremities 4 with equal strength. Cranial nerves II through XII grossly intact. Extremities: No clubbing or cyanosis. No edema.   Data Reviewed: I have personally reviewed following labs and imaging studies  CBC: Recent Labs  Lab 07/22/22 0453 07/23/22 0621 07/25/22 0640 07/26/22 1036  WBC 12.2* 9.6 6.8 6.2  NEUTROABS  --   --  4.8 4.3  HGB 10.8* 10.8* 10.8* 12.1  HCT 32.9* 33.0* 31.6* 35.4*  MCV 93.2 93.0 90.5 89.4  PLT 147* 161 173 [id number]    Basic Metabolic Panel: Recent Labs  Lab 07/22/22 0453  07/22/22 1632 07/23/22 0621 07/24/22 0253 07/25/22 0640 07/26/22 1036 07/27/22 0622 07/28/22 0625  NA 138  --  137 139 139 139 137 137  K 4.4  --  3.1* 3.7 3.4* 3.7 4.1 3.5  CL 110  --  106 110 110 105 106 107  CO2 21*  --  23 21* 20* '23 24 22  '$ GLUCOSE 108*  --  83 82 83 115* 90 90  BUN 8  --  6 5* 5* <5* 7 11  CREATININE 0.77  --  0.91 0.87 0.92 1.00 1.08* 0.86  CALCIUM 8.3*  --  8.1* 8.3* 8.5* 8.8* 8.5* 8.6*  MG 2.1 2.0 1.9  --  1.8 1.9 1.9 2.0  PHOS 3.1 2.1* 2.5  --  3.3 3.7  --   --     GFR: Estimated  Creatinine Clearance: 88.4 mL/min (by C-G formula based on SCr of 0.86 mg/dL).  Liver Function Tests: Recent Labs  Lab 07/23/22 0621 07/24/22 0253 07/25/22 0640 07/26/22 1036 07/27/22 0622  AST 256* 187* 107* 72* 49*  ALT 73* 65* 53* 46* 38  ALKPHOS 68 62 54 62 53  BILITOT 0.5 0.7 0.5 0.7 0.5  PROT 6.2* 6.1* 6.2* 6.8 6.1*  ALBUMIN 3.3* 3.2* 3.2* 3.7 3.3*    CBG: Recent Labs  Lab 07/21/22 1945 07/21/22 2326 07/22/22 0411 07/22/22 0800 07/22/22 1141  GLUCAP 106* 115* 104* 110* 108*     Recent Results (from the past 240 hour(s))  MRSA Next Gen by PCR, Nasal     Status: None   Collection Time: 07/21/22  1:01 AM   Specimen: Nasal Mucosa; Nasal Swab  Result Value Ref Range Status   MRSA by PCR Next Gen NOT DETECTED NOT DETECTED Final    Comment: (NOTE) The GeneXpert MRSA Assay (FDA approved for NASAL specimens only), is one component of Malissa Slay comprehensive MRSA colonization surveillance program. It is not intended to diagnose MRSA infection nor to guide or monitor treatment for MRSA infections. Test performance is not FDA approved in patients less than 4 years old. Performed at [facility], [place] [address] [place], [place] [address]   Resp panel by RT-PCR (RSV, Flu Amayra Kiedrowski&B, Covid) Anterior Nasal Swab     Status: None   Collection Time: 07/21/22  2:09 AM   Specimen: Anterior Nasal Swab  Result Value Ref Range Status   SARS Coronavirus 2 by RT PCR  NEGATIVE NEGATIVE Final   Influenza Maricel Swartzendruber by PCR NEGATIVE NEGATIVE Final   Influenza B by PCR NEGATIVE NEGATIVE Final    Comment: (NOTE) The Xpert Xpress SARS-CoV-2/FLU/RSV plus assay is intended as an aid in the diagnosis of influenza from Nasopharyngeal swab specimens and should not be used as Wilferd Ritson sole basis for treatment. Nasal washings and aspirates are unacceptable for Xpert Xpress SARS-CoV-2/FLU/RSV testing.  Fact Sheet for Patients: EntrepreneurPulse.com.au  Fact Sheet for Healthcare Providers: IncredibleEmployment.be  This test is not yet approved or cleared by the [place] FDA and has been authorized for detection and/or diagnosis of SARS-CoV-2 by FDA under an Emergency Use Authorization (EUA). This EUA will remain in effect (meaning this test can be used) for the duration of the COVID-19 declaration under Section 564(b)(1) of the Act, 21 U.S.C. section 360bbb-3(b)(1), unless the authorization is terminated or revoked.     Resp Syncytial Virus by PCR NEGATIVE NEGATIVE Final    Comment: (NOTE) Fact Sheet for Patients: EntrepreneurPulse.com.au  Fact Sheet for Healthcare Providers: IncredibleEmployment.be  This test is not yet approved or cleared by the [place] FDA and has been authorized for detection and/or diagnosis of SARS-CoV-2 by FDA under an Emergency Use Authorization (EUA). This EUA will remain in effect (meaning this test can be used) for the duration of the COVID-19 declaration under Section 564(b)(1) of the Act, 21 U.S.C. section 360bbb-3(b)(1), unless the authorization is terminated or revoked.  Performed at [facility], [place] [address], [place], [place] [address]          Radiology Studies: No results found.      Scheduled Meds:  diclofenac Sodium  2 g Topical QID   docusate sodium  100 mg Oral BID   lidocaine  1-2 patch Transdermal Q24H   magnesium oxide   400 mg Oral Daily   sodium chloride flush  3 mL Intravenous Q12H   traZODone  50 mg Oral QHS   Continuous Infusions:  sodium chloride     promethazine (PHENERGAN) injection (IM or IVPB) Stopped (07/22/22 1227)     LOS: 7 days    Time spent: over 30 min    Fayrene Helper, MD [facility]   To contact the attending provider between 7A-7P or the covering provider during after hours 7P-7A, please log into the web site [email or web address] and access using universal [place] password for that web site. If you do not have the password, please call the hospital operator.  07/28/2022, 2:50 PM

## 2022-07-28 NOTE — Evaluation (Signed)
Speech Language Pathology Evaluation Patient Details Name: Alexandra Freeman MRN: [id number] DOB: 10-10-1995 Today's Date: 07/28/2022 Time: [id number] SLP Time Calculation (min) (ACUTE ONLY): 27 min  Problem List:  Patient Active Problem List   Diagnosis Date Noted   Nausea and vomiting 07/23/2022   Hypokalemia 07/23/2022   Hypophosphatemia [id number]   Acute metabolic encephalopathy Q000111Q   Ventricular fibrillation ([place]) 07/23/2022   Cardiac arrest ([place]) 07/21/2022   Acute respiratory failure ([place]) 07/21/2022   AKI (acute kidney injury) ([place]) 07/21/2022   Aspiration pneumonia ([place]) 07/21/2022   Encephalopathy acute 07/21/2022   Abnormal bleeding in menstrual cycle 12/01/2020   Postpartum depression 10/08/2020   Gestational hypertension 07/31/2020   Irritable bowel syndrome with diarrhea 06/14/2020   Past Medical History:  Past Medical History:  Diagnosis Date   Medical history non-contributory    Menorrhagia    Past Surgical History:  Past Surgical History:  Procedure Laterality Date   LEFT HEART CATH AND CORONARY ANGIOGRAPHY N/A 07/23/2022   Procedure: LEFT HEART CATH AND CORONARY ANGIOGRAPHY;  Surgeon: Wellington Hampshire, MD;  Location: [facility];  Service: Cardiovascular;  Laterality: N/A;   NO PAST SURGERIES     HPI:  Pt is a 27 y/o female who presented on 2/18 with v-fib arrest, received CPR by EMS, downtime 18 minutes. ETT 2/18-20. [place] 2/21, awaiting pacemaker. PMH: marijuana use.   Assessment / Plan / Recommendation Clinical Impression  Pt participated in speech-language-cognition evaluation with her mother present who reported that the pt's cognition is "getting better", but is not at baseline. Pt reported that she resides with her mother, completed some college-level coursework, and works full time as a Chemical engineer at [employer]. She denied any baseline deficits or acute changes in speech, language, or cognition. [facility]  Mental Status Examination was completed to evaluate the pt's cognitive-linguistic skills. She achieved a score of 12/30 which is below the normal limits of 27 or more out of 30. Pt exhibited impairments in the areas of sustained attention, memory, and executive function as it relates to awareness, complex problem solving, and mental flexibility. Motor speech and language skills were [facility]. Skilled SLP services are clinically indicated at this time.    SLP Assessment  SLP Recommendation/Assessment: Patient needs continued Speech [place] [facility] SLP Visit Diagnosis: Cognitive communication deficit (R41.841)    Recommendations for follow up therapy are one component of a multi-disciplinary discharge planning process, led by the attending physician.  Recommendations may be updated based on patient status, additional functional criteria and insurance authorization.    Follow Up Recommendations  Outpatient SLP    Assistance Recommended at Discharge  Intermittent Supervision/Assistance  Functional Status Assessment Patient has had a recent decline in their functional status and demonstrates the ability to make significant improvements in function in a reasonable and predictable amount of time.  Frequency and Duration min 2x/week  2 weeks      SLP Evaluation Cognition  Overall Cognitive Status: Impaired/Different from baseline Arousal/Alertness: Awake/alert Orientation Level: Oriented X4 Year: 2024 Month: February Day of Week: Correct Attention: Focused;Sustained Focused Attention: Appears intact Sustained Attention: Appears intact Memory: Impaired Memory Impairment: Storage deficit;Decreased recall of new information (Immediate: 5/5; delayed: 1/5; with cues: 4/4) Awareness: Impaired Awareness Impairment: Intellectual impairment Problem Solving: Impaired Problem Solving Impairment: Verbal complex (money: 1/3) Executive Function: Sequencing;Organizing Sequencing:  Impaired Sequencing Impairment: Verbal complex (clock: 2/4 after three attempts) Organizing: Impaired Organizing Impairment: Verbal complex (backward digit span: 1/2)  Comprehension  Auditory Comprehension Overall Auditory Comprehension: Appears within functional limits for tasks assessed Yes/No Questions: Within Functional Limits Commands: Within Functional Limits Conversation: Complex    Expression Expression Primary Mode of Expression: Verbal Verbal Expression Overall Verbal Expression: Appears within functional limits for tasks assessed Initiation: No impairment Level of Generative/Spontaneous Verbalization: Conversation Repetition: No impairment Naming: No impairment Pragmatics: No impairment   Oral / Motor  Oral Motor/Sensory Function Overall Oral Motor/Sensory Function: Within functional limits Motor Speech Overall Motor Speech: Appears within functional limits for tasks assessed Respiration: Within functional limits Phonation: Normal Resonance: Within functional limits Articulation: Within functional limitis Intelligibility: Intelligible Motor Planning: Witnin functional limits Motor Speech Errors: Not applicable           Alexandra Freeman, [place], [place] Office number [phone]  Horton Marshall 07/28/2022, 4:16 PM

## 2022-07-28 NOTE — Progress Notes (Signed)
   Electrophysiology Rounding Note  Patient Name: Alexandra Freeman Date of Encounter: 07/28/2022  Primary Cardiologist: None Electrophysiologist: New   Subjective   The patient is doing well today.  At this time, the patient denies chest pain, shortness of breath, or any new concerns.  Inpatient Medications    Scheduled Meds:  diclofenac Sodium  2 g Topical QID   docusate sodium  100 mg Oral BID   heparin injection (subcutaneous)  5,000 Units Subcutaneous Q8H   lidocaine  1-2 patch Transdermal Q24H   magnesium oxide  400 mg Oral Daily   sodium chloride flush  3 mL Intravenous Q12H   traZODone  50 mg Oral QHS   Continuous Infusions:  sodium chloride     lactated ringers 75 mL/hr at 07/24/22 2045   promethazine (PHENERGAN) injection (IM or IVPB) Stopped (07/22/22 1227)   PRN Meds: sodium chloride, acetaminophen, docusate sodium, ondansetron (ZOFRAN) IV, mouth rinse, polyethylene glycol, promethazine (PHENERGAN) injection (IM or IVPB) **OR** promethazine, sodium chloride flush   Vital Signs    Vitals:   07/27/22 1142 07/27/22 1656 07/27/22 2037 07/28/22 0628  BP: 128/78 (!) 119/91 130/89 113/72  Pulse: 67 89 (!) 58 61  Resp: 15 18 18 18  $ Temp: 98.1 F (36.7 C) 98.3 F (36.8 C) 97.7 F (36.5 C) 98.2 F (36.8 C)  TempSrc: Oral Oral Oral Oral  SpO2:  98% 99% 98%  Weight:      Height:        Intake/Output Summary (Last 24 hours) at 07/28/2022 0802 Last data filed at 07/27/2022 2221 Gross per 24 hour  Intake 246 ml  Output --  Net 246 ml   Filed Weights   07/25/22 0611 07/26/22 0548 07/27/22 0613  Weight: 69.4 kg 68 kg 70 kg    Physical Exam    GEN- The patient is well appearing, alert and oriented x 3 today.   HEENT- No gross abnormality.  Lungs- Clear to ausculation bilaterally, normal work of breathing Heart- Regular rate and rhythm, no murmurs, rubs or gallops GI- soft, NT, ND, + BS Extremities- no clubbing or cyanosis. No edema Neuro- No obvious  focal abnormality.   Telemetry    NSR 50-80s (personally reviewed)  Initial EMS rhythm   Patient Profile     27 y.o. 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 admitted with cardiac arrest   EMS report reviewed On their arrival GFD on scene,(no bystander CPR per our record was performed, perhaps 6-7 minutes), bystanders reported they had all been smoking marijuana when the patient collapsed 21:16 EMS: Found pulseless and apneic CPR started on their arrival BVM > iGEL airway Initial rhythm is VF Defibrillated 360J IO achieved >  epi  Defibrillated 360J Amiodarone 300 Epi CPR stopped >>  21:24 cardioverted 100J (?VT) Final rhythm is SR 70's Fentanyl IV established Epinephrine gtt started Narcan given  Assessment & Plan    Cardiac arrest VF Echo 50-55% Planning cMRI tomorrow (currently scheduled for 1100 per pt) S-ICD, timing pending. Potentially tomorrow.   For questions or updates, please contact [place] Please consult [email or web address] for contact info under Cardiology/STEMI.  Signed, Shirley Friar, PA-C  07/28/2022, 8:02 AM

## 2022-07-29 ENCOUNTER — Inpatient Hospital Stay (HOSPITAL_COMMUNITY): Payer: Medicaid Other

## 2022-07-29 ENCOUNTER — Encounter (HOSPITAL_COMMUNITY)
Admission: EM | Disposition: A | Discharge status: Home / Self Care | Payer: Self-pay | Source: Home / Self Care | Attending: Family Medicine

## 2022-07-29 ENCOUNTER — Inpatient Hospital Stay (HOSPITAL_COMMUNITY): Payer: Medicaid Other | Admitting: Certified Registered"

## 2022-07-29 ENCOUNTER — Encounter (HOSPITAL_COMMUNITY): Payer: Self-pay | Admitting: Critical Care Medicine

## 2022-07-29 DIAGNOSIS — I3139 Other pericardial effusion (noninflammatory): Secondary | ICD-10-CM

## 2022-07-29 DIAGNOSIS — I469 Cardiac arrest, cause unspecified: Secondary | ICD-10-CM

## 2022-07-29 HISTORY — PX: RADIOLOGY WITH ANESTHESIA: SHX6223

## 2022-07-29 SURGERY — RADIOLOGY WITH ANESTHESIA
Anesthesia: General

## 2022-07-29 MED ORDER — OXYCODONE HCL 5 MG PO TABS: 5.0000 mg | ORAL_TABLET | Freq: Once | ORAL | Status: DC | PRN

## 2022-07-29 MED ORDER — ACETAMINOPHEN 160 MG/5ML PO SOLN: 325.0000 mg | ORAL | Status: DC | PRN

## 2022-07-29 MED ORDER — MIDAZOLAM HCL 2 MG/2ML IJ SOLN
INTRAMUSCULAR | Status: DC | PRN
  Administered 2022-07-29: 2 mg via INTRAVENOUS

## 2022-07-29 MED ORDER — SUGAMMADEX SODIUM 200 MG/2ML IV SOLN
INTRAVENOUS | Status: DC | PRN
  Administered 2022-07-29: 200 mg via INTRAVENOUS

## 2022-07-29 MED ORDER — FENTANYL CITRATE (PF) 100 MCG/2ML IJ SOLN: 25.0000 ug | INTRAMUSCULAR | Status: DC | PRN

## 2022-07-29 MED ORDER — ROCURONIUM BROMIDE 10 MG/ML (PF) SYRINGE
PREFILLED_SYRINGE | INTRAVENOUS | Status: DC | PRN
  Administered 2022-07-29: 20 mg via INTRAVENOUS
  Administered 2022-07-29: 50 mg via INTRAVENOUS

## 2022-07-29 MED ORDER — PROMETHAZINE HCL 25 MG/ML IJ SOLN: 6.2500 mg | INTRAMUSCULAR | Status: DC | PRN

## 2022-07-29 MED ORDER — PHENYLEPHRINE 80 MCG/ML (10ML) SYRINGE FOR IV PUSH (FOR BLOOD PRESSURE SUPPORT)
PREFILLED_SYRINGE | INTRAVENOUS | Status: DC | PRN
  Administered 2022-07-29 (×2): 80 ug via INTRAVENOUS

## 2022-07-29 MED ORDER — OXYCODONE HCL 5 MG/5ML PO SOLN: 5.0000 mg | Freq: Once | ORAL | Status: DC | PRN

## 2022-07-29 MED ORDER — ORAL CARE MOUTH RINSE: 15.0000 mL | OROMUCOSAL | Status: DC | PRN

## 2022-07-29 MED ORDER — PROPOFOL 10 MG/ML IV BOLUS
INTRAVENOUS | Status: DC | PRN
  Administered 2022-07-29: 200 mg via INTRAVENOUS

## 2022-07-29 MED ORDER — DEXAMETHASONE SODIUM PHOSPHATE 10 MG/ML IJ SOLN
INTRAMUSCULAR | Status: DC | PRN
  Administered 2022-07-29: 10 mg via INTRAVENOUS

## 2022-07-29 MED ORDER — MEPERIDINE HCL 25 MG/ML IJ SOLN: 6.2500 mg | INTRAMUSCULAR | Status: DC | PRN

## 2022-07-29 MED ORDER — CHLORHEXIDINE GLUCONATE 0.12 % MT SOLN
15.0000 mL | Freq: Once | OROMUCOSAL | Status: AC
  Administered 2022-07-29: 15 mL via OROMUCOSAL
  Filled 2022-07-29: qty 15

## 2022-07-29 MED ORDER — ACETAMINOPHEN 325 MG PO TABS: 325.0000 mg | ORAL_TABLET | ORAL | Status: DC | PRN

## 2022-07-29 MED ORDER — FENTANYL CITRATE (PF) 250 MCG/5ML IJ SOLN
INTRAMUSCULAR | Status: DC | PRN
  Administered 2022-07-29: 100 ug via INTRAVENOUS

## 2022-07-29 MED ORDER — ORAL CARE MOUTH RINSE: 15.0000 mL | Freq: Once | OROMUCOSAL | Status: AC

## 2022-07-29 MED ORDER — LACTATED RINGERS IV SOLN: INTRAVENOUS | Status: DC

## 2022-07-29 MED ORDER — LIDOCAINE 2% (20 MG/ML) 5 ML SYRINGE
INTRAMUSCULAR | Status: DC | PRN
  Administered 2022-07-29: 100 mg via INTRAVENOUS

## 2022-07-29 MED ORDER — GADOBUTROL 1 MMOL/ML IV SOLN
7.0000 mL | Freq: Once | INTRAVENOUS | Status: AC | PRN
  Administered 2022-07-29: 7 mL via INTRAVENOUS

## 2022-07-29 MED ORDER — ONDANSETRON HCL 4 MG/2ML IJ SOLN
INTRAMUSCULAR | Status: DC | PRN
  Administered 2022-07-29: 4 mg via INTRAVENOUS

## 2022-07-29 NOTE — Progress Notes (Signed)
Patient taken for cMRI at 0940hrs.

## 2022-07-29 NOTE — Anesthesia Procedure Notes (Signed)
Procedure Name: Intubation Date/Time: 07/29/2022 11:08 AM  Performed by: Carolan Clines, CRNAPre-anesthesia Checklist: Patient identified, Emergency Drugs available, Suction available and Patient being monitored Patient Re-evaluated:Patient Re-evaluated prior to induction Oxygen Delivery Method: Circle System Utilized Preoxygenation: Pre-oxygenation with 100% oxygen Induction Type: IV induction Ventilation: Mask ventilation without difficulty Laryngoscope Size: Mac and 3 Grade View: Grade II Tube type: Oral Tube size: 7.0 mm Number of attempts: 1 Airway Equipment and Method: Stylet Placement Confirmation: ETT inserted through vocal cords under direct vision, positive ETCO2 and breath sounds checked- equal and bilateral Secured at: 22 cm Tube secured with: Tape Dental Injury: Teeth and Oropharynx as per pre-operative assessment

## 2022-07-29 NOTE — Transfer of Care (Signed)
Immediate Anesthesia Transfer of Care Note  Patient: Alexandra Freeman  Procedure(s) Performed: RADIOLOGY WITH ANESTHESIA MRI CARDIOLOGY  Patient Location: PACU  Anesthesia Type:General  Level of Consciousness: awake, alert , and oriented  Airway & Oxygen Therapy: Patient Spontanous Breathing  Post-op Assessment: Report given to RN and Post -op Vital signs reviewed and stable  Post vital signs: Reviewed and stable  Last Vitals:  Vitals Value Taken Time  BP 110/63 07/29/22 1245  Temp 36.4 C 07/29/22 1245  Pulse 53 07/29/22 1247  Resp 0 07/29/22 1247  SpO2 100 % 07/29/22 1247  Vitals shown include unvalidated device data.  Last Pain:  Vitals:   07/29/22 1007  TempSrc: Oral  PainSc: 0-No pain      Patients Stated Pain Goal: 0 ([phone] [id number])  Complications: No notable events documented.

## 2022-07-29 NOTE — Progress Notes (Signed)
PROGRESS NOTE    Alexandra Freeman  [id number] DOB: 1995/06/29 DOA: 07/20/2022 PCP: Patient, No Pcp Per  Chief Complaint  Patient presents with   POST CPR    Brief Narrative:   Patient is Alexandra Freeman 27 year old female pertinent PMH menorrhagia on birth control presents to [facility] ED on 2/18 cardiac arrest.   Per EMS, patient was at home-- had smoking weed with family earlier that night (pt and fam smoked, not just pt) and then was sitting on couch laughing w moms when suddenly pt slumped over. 1 mom checked pulse and was pulseless, started chest compressions. Other mom called EMS. Down for approximately 6 to 7 minutes with no CPR prior to EMS arrival.  Patient found to be in V-fib arrest, defibrillated, and received 11 minutes of CPR until ROSC. Patient post ROSC in vtach was then cardioverted and given amio with sinus rhythm. Patient transported to [facility].  In route to [facility], patient biting at igel supraglottic airway and was given 50 mics of fentanyl.   Upon arrival to [facility] ED, patient vitals stable moving extremities.  Temp 95.1 F.  Airway exchanged for ETT. CXR showing ETT in good position. CTA chest without PE. CT head no acute abnormality. EKG qtc 495, no ST elevations. ABG 7.24, 41, 191, 18.  Potassium 3.4, glucose 185, creatinine 1.28, calcium 8.3.  Troponin 107.  Ethanol WNL.  UDS positive for THC.  UA unremarkable.  PCCM consulted for ICU admission.   Significant Events 2/18 admitted post cardiac arrest 2/19 EEG, ECHO with LVEF 50-55% no wall motion abnormalities 2/20 extubated, emesis. Cards / EP consult   2/21 transfer out of ICU. LHC done 2/27 cardiac MRI and MRI brain Discharge pending ICD placement  Assessment & Plan:   Principal Problem:   Cardiac arrest [facility]) Active Problems:   Acute respiratory failure (HCC)   AKI (acute kidney injury) ([place])   Aspiration pneumonia (HCC)   Encephalopathy acute   Nausea and vomiting   Hypokalemia   Hypophosphatemia   Acute metabolic  encephalopathy   Ventricular fibrillation ([place])  Witnessed VF arrest  - unclear cause.  did get immediate bystander CPR. about 17-18 min downtime total. - plan for cardiac MRI under anesthesia followed by defibrillator - results pending 2/27 - eventual ICD, but needs improved mental status first - plan for ICD prior to discharge - [place] 2/21 with normal coronary arteries - echo 2/19 with EF 50-55%, no RWMA, mildly elevated PASP, dilated IVC with <50% resp variability - no acute intracranial process - no fam hx of sudden cardiac death  - lytes K>4 mag>2  Acute encephalopathy  - meds, ICU delirium, probably some component of anoxic injury  - MRI 2/21 motion limited without definite evidence of hypoxic/ischemic injury - normal brain MRI 2/27  - SLP cognitive eval.  PT/OT. - will need outpatient neurology   Possible aspiration  Completed short course unasyn   Transaminitis   most likely in setting of arrest downtrending   Elevated trops  Per cards, w/u for arrest as noted above LHC without CAD  N/v  resolved   Anemia, stable -PRN CBC   Hypophosphatemia Hypokalemia  P -replace -Cont to trend   Insomnia Trazodone    DVT prophylaxis: frequent ambulation, d/c heparin due to her request - she's up and walking frequently   Code Status: full Family Communication: mom Disposition:   Status is: Inpatient Remains inpatient appropriate because: pending cardiac workup   Consultants:  cardiology  Procedures:  LHC 1.  Normal coronary arteries. 2.  Left ventricular angiography was not performed.  EF was 50 to 55% by echo. 3.  Mildly elevated left ventricular end-diastolic pressure at 14 mmHg.   Recommendations: No ischemic etiology is identified for cardiac arrest.  Echo IMPRESSIONS     1. Left ventricular ejection fraction, by estimation, is 50 to 55%. The  left ventricle has low normal function. The left ventricle has no regional  wall motion abnormalities. Left  ventricular diastolic parameters were  normal.   2. Right ventricular systolic function is normal. The right ventricular  size is normal. There is mildly elevated pulmonary artery systolic  pressure. The estimated right ventricular systolic pressure is [id number] mmHg.   3. The mitral valve is normal in structure. No evidence of mitral valve  regurgitation. No evidence of mitral stenosis.   4. The aortic valve is tricuspid. Aortic valve regurgitation is not  visualized. No aortic stenosis is present.   5. The inferior vena cava is dilated in size with <50% respiratory  variability, suggesting right atrial pressure of 15 mmHg.   Antimicrobials:  Anti-infectives (From admission, onward)    Start     Dose/Rate Route Frequency Ordered Stop   07/21/22 1300  Ampicillin-Sulbactam (UNASYN) 3 g in sodium chloride 0.9 % 100 mL IVPB        3 g 200 mL/hr over 30 Minutes Intravenous Every 8 hours 07/21/22 1206 07/24/22 0522       Subjective: No complaints, seen after MRI   Objective: Vitals:   07/29/22 1315 07/29/22 1337 07/29/22 1400 07/29/22 1700  BP: (!) 111/93 127/78 132/83 131/83  Pulse: (!) 51 (!) 56 62 (!) 102  Resp: '16 18 17 18  '$ Temp: 97.6 F (36.4 C) 97.8 F (36.6 C) 98.8 F (37.1 C) 98.3 F (36.8 C)  TempSrc:  Oral Oral Oral  SpO2: 100% 100% 100% 100%  Weight:      Height:        Intake/Output Summary (Last 24 hours) at 07/29/2022 1712 Last data filed at 07/29/2022 1248 Gross per 24 hour  Intake 656 ml  Output --  Net 656 ml   Filed Weights   07/27/22 0613 07/28/22 1000 07/29/22 0908  Weight: 70 kg 65.9 kg 65.2 kg    Examination:  General: No acute distress. Lungs: unlabored Neurological: Alert . Moves all extremities 4 with equal strength. Cranial nerves II through XII grossly intact. Extremities: No clubbing or cyanosis. No edema.  Data Reviewed: I have personally reviewed following labs and imaging studies  CBC: Recent Labs  Lab 07/23/22 0621 07/25/22 0640  07/26/22 1036  WBC 9.6 6.8 6.2  NEUTROABS  --  4.8 4.3  HGB 10.8* 10.8* 12.1  HCT 33.0* 31.6* 35.4*  MCV 93.0 90.5 89.4  PLT 161 173 [id number]    Basic Metabolic Panel: Recent Labs  Lab 07/23/22 0621 07/24/22 0253 07/25/22 0640 07/26/22 1036 07/27/22 0622 07/28/22 0625  NA 137 139 139 139 137 137  K 3.1* 3.7 3.4* 3.7 4.1 3.5  CL 106 110 110 105 106 107  CO2 23 21* 20* '23 24 22  '$ GLUCOSE 83 82 83 115* 90 90  BUN 6 5* 5* <5* 7 11  CREATININE 0.91 0.87 0.92 1.00 1.08* 0.86  CALCIUM 8.1* 8.3* 8.5* 8.8* 8.5* 8.6*  MG 1.9  --  1.8 1.9 1.9 2.0  PHOS 2.5  --  3.3 3.7  --   --     GFR: Estimated Creatinine Clearance: 88.4 mL/min (by C-G formula  based on SCr of 0.86 mg/dL).  Liver Function Tests: Recent Labs  Lab 07/23/22 0621 07/24/22 0253 07/25/22 0640 07/26/22 1036 07/27/22 0622  AST 256* 187* 107* 72* 49*  ALT 73* 65* 53* 46* 38  ALKPHOS 68 62 54 62 53  BILITOT 0.5 0.7 0.5 0.7 0.5  PROT 6.2* 6.1* 6.2* 6.8 6.1*  ALBUMIN 3.3* 3.2* 3.2* 3.7 3.3*    CBG: No results for input(s): "GLUCAP" in the last 168 hours.    Recent Results (from the past 240 hour(s))  MRSA Next Gen by PCR, Nasal     Status: None   Collection Time: 07/21/22  1:01 AM   Specimen: Nasal Mucosa; Nasal Swab  Result Value Ref Range Status   MRSA by PCR Next Gen NOT DETECTED NOT DETECTED Final    Comment: (NOTE) The GeneXpert MRSA Assay (FDA approved for NASAL specimens only), is one component of [place] comprehensive MRSA colonization surveillance program. It is not intended to diagnose MRSA infection nor to guide or monitor treatment for MRSA infections. Test performance is not FDA approved in patients less than 69 years old. Performed at [facility], [place] [address], [place], [place] [address]   Resp panel by RT-PCR (RSV, Flu Itzamar Traynor&B, Covid) Anterior Nasal Swab     Status: None   Collection Time: 07/21/22  2:09 AM   Specimen: Anterior Nasal Swab  Result Value Ref Range Status   SARS Coronavirus 2  by RT PCR NEGATIVE NEGATIVE Final   Influenza [place] by PCR NEGATIVE NEGATIVE Final   Influenza B by PCR NEGATIVE NEGATIVE Final    Comment: (NOTE) The Xpert Xpress SARS-CoV-2/FLU/RSV plus assay is intended as an aid in the diagnosis of influenza from Nasopharyngeal swab specimens and should not be used as [place] Recupero sole basis for treatment. Nasal washings and aspirates are unacceptable for Xpert Xpress SARS-CoV-2/FLU/RSV testing.  Fact Sheet for Patients: EntrepreneurPulse.com.au  Fact Sheet for Healthcare Providers: IncredibleEmployment.be  This test is not yet approved or cleared by the [place] FDA and has been authorized for detection and/or diagnosis of SARS-CoV-2 by FDA under an Emergency Use Authorization (EUA). This EUA will remain in effect (meaning this test can be used) for the duration of the COVID-19 declaration under Section 564(b)(1) of the Act, 21 U.S.C. section 360bbb-3(b)(1), unless the authorization is terminated or revoked.     Resp Syncytial Virus by PCR NEGATIVE NEGATIVE Final    Comment: (NOTE) Fact Sheet for Patients: EntrepreneurPulse.com.au  Fact Sheet for Healthcare Providers: IncredibleEmployment.be  This test is not yet approved or cleared by the [place] FDA and has been authorized for detection and/or diagnosis of SARS-CoV-2 by FDA under an Emergency Use Authorization (EUA). This EUA will remain in effect (meaning this test can be used) for the duration of the COVID-19 declaration under Section 564(b)(1) of the Act, 21 U.S.C. section 360bbb-3(b)(1), unless the authorization is terminated or revoked.  Performed at [facility], [place] [address], [place], [place] [address]          Radiology Studies: MR BRAIN WO CONTRAST  Result Date: 07/29/2022 CLINICAL DATA:  Mental status change, unknown cause. EXAM: MRI HEAD WITHOUT CONTRAST TECHNIQUE: Multiplanar, multiecho  pulse sequences of the brain and surrounding structures were obtained without intravenous contrast. COMPARISON:  Head CT 07/20/2022.  MRI brain 07/23/2022. FINDINGS: Brain: No acute infarct or hemorrhage. No mass or midline shift. No hydrocephalus or extra-axial collection. Basilar cisterns are patent. Vascular: Normal flow voids. Skull and upper cervical spine:  Normal marrow signal. Sinuses/Orbits: Unremarkable. Other: None. IMPRESSION: Normal brain MRI. Electronically Signed   By: Emmit Alexanders M.D.   On: 07/29/2022 14:26        Scheduled Meds:  diclofenac Sodium  2 g Topical QID   docusate sodium  100 mg Oral BID   lidocaine  1-2 patch Transdermal Q24H   magnesium oxide  400 mg Oral Daily   sodium chloride flush  3 mL Intravenous Q12H   traZODone  50 mg Oral QHS   Continuous Infusions:  sodium chloride     promethazine (PHENERGAN) injection (IM or IVPB) Stopped (07/22/22 1227)     LOS: 8 days    Time spent: over 30 min    Fayrene Helper, MD [facility]   To contact the attending provider between 7A-7P or the covering provider during after hours 7P-7A, please log into the web site [email or web address] and access using universal [place] password for that web site. If you do not have the password, please call the hospital operator.  07/29/2022, 5:12 PM

## 2022-07-29 NOTE — Progress Notes (Signed)
   Electrophysiology Rounding Note  Patient Name: Brynne Cadwell Date of Encounter: 07/29/2022  Primary Cardiologist: None Electrophysiologist: New   Subjective   NAEON, sleeping well this AM  Inpatient Medications    Scheduled Meds:  diclofenac Sodium  2 g Topical QID   docusate sodium  100 mg Oral BID   lidocaine  1-2 patch Transdermal Q24H   magnesium oxide  400 mg Oral Daily   sodium chloride flush  3 mL Intravenous Q12H   traZODone  50 mg Oral QHS   Continuous Infusions:  sodium chloride     promethazine (PHENERGAN) injection (IM or IVPB) Stopped (07/22/22 1227)   PRN Meds: sodium chloride, acetaminophen, docusate sodium, ondansetron (ZOFRAN) IV, mouth rinse, polyethylene glycol, promethazine (PHENERGAN) injection (IM or IVPB) **OR** promethazine, sodium chloride flush   Vital Signs    Vitals:   07/28/22 1000 07/28/22 1215 07/28/22 2021 07/29/22 0654  BP:  128/71 123/75 102/64  Pulse:  65 70 (!) 56  Resp:  18 18 18  $ Temp:  98 F (36.7 C) 97.9 F (36.6 C) 97.7 F (36.5 C)  TempSrc:  Oral Oral Oral  SpO2:   99% 98%  Weight: 65.9 kg     Height:        Intake/Output Summary (Last 24 hours) at 07/29/2022 0838 Last data filed at 07/28/2022 2056 Gross per 24 hour  Intake 6 ml  Output --  Net 6 ml    Filed Weights   07/26/22 0548 07/27/22 0613 07/28/22 1000  Weight: 68 kg 70 kg 65.9 kg    Physical Exam    GEN- The patient is well appearing, alert and oriented x 3 today.   HEENT- No gross abnormality.  Lungs- Clear to ausculation bilaterally, normal work of breathing Heart- Regular rate and rhythm, no murmurs, rubs or gallops GI- soft, NT, ND, + BS Extremities- no clubbing or cyanosis. No edema Neuro- No obvious focal abnormality.   Telemetry    NSR 50-80s (personally reviewed)  Initial EMS rhythm   Patient Profile     27 y.o. 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 admitted with cardiac arrest   EMS report reviewed On their arrival  GFD on scene,(no bystander CPR per our record was performed, perhaps 6-7 minutes), bystanders reported they had all been smoking marijuana when the patient collapsed 21:16 EMS: Found pulseless and apneic CPR started on their arrival BVM > iGEL airway Initial rhythm is VF Defibrillated 360J IO achieved >  epi  Defibrillated 360J Amiodarone 300 Epi CPR stopped >>  21:24 cardioverted 100J (?VT) Final rhythm is SR 70's Fentanyl IV established Epinephrine gtt started Narcan given  Assessment & Plan    Cardiac arrest VF Echo 50-55% Planning cMRI today (currently scheduled for 1100) S-ICD, timing pending. Potentially tomorrow.   For questions or updates, please contact [place] Please consult [email or web address] for contact info under Cardiology/STEMI.  Signed, Mamie Levers, NP  07/29/2022, 8:38 AM

## 2022-07-29 NOTE — Progress Notes (Signed)
Patient returned from MRI at 1335hrs.  Alert and oriented.

## 2022-07-29 NOTE — Anesthesia Preprocedure Evaluation (Signed)
Anesthesia Evaluation  Patient identified by MRN, date of birth, ID band Patient awake    Reviewed: Allergy & Precautions, NPO status , Patient's Chart, lab work & pertinent test results  Airway Mallampati: II       Dental  (+) Poor Dentition   Pulmonary    Pulmonary exam normal        Cardiovascular  Rhythm:Regular Rate:Normal     Neuro/Psych negative neurological ROS     GI/Hepatic negative GI ROS, Neg liver ROS,,,  Endo/Other  negative endocrine ROS    Renal/GU   negative genitourinary   Musculoskeletal negative musculoskeletal ROS (+)    Abdominal Normal abdominal exam  (+)   Peds  Hematology negative hematology ROS (+)   Anesthesia Other Findings   Reproductive/Obstetrics                             Anesthesia Physical Anesthesia Plan  ASA: 2  Anesthesia Plan: General   Post-op Pain Management:    Induction: Intravenous  PONV Risk Score and Plan: Propofol infusion, TIVA and Midazolam  Airway Management Planned: LMA  Additional Equipment: None  Intra-op Plan:   Post-operative Plan: Extubation in OR  Informed Consent: I have reviewed the patients History and Physical, chart, labs and discussed the procedure including the risks, benefits and alternatives for the proposed anesthesia with the patient or authorized representative who has indicated his/her understanding and acceptance.     Dental advisory given  Plan Discussed with: CRNA  Anesthesia Plan Comments:        Anesthesia Quick Evaluation

## 2022-07-30 ENCOUNTER — Inpatient Hospital Stay (HOSPITAL_COMMUNITY): Payer: Medicaid Other | Admitting: Anesthesiology

## 2022-07-30 ENCOUNTER — Encounter (HOSPITAL_COMMUNITY)
Admission: EM | Disposition: A | Discharge status: Home / Self Care | Payer: Self-pay | Source: Home / Self Care | Attending: Family Medicine

## 2022-07-30 ENCOUNTER — Encounter (HOSPITAL_COMMUNITY): Payer: Self-pay | Admitting: Radiology

## 2022-07-30 DIAGNOSIS — E876 Hypokalemia: Secondary | ICD-10-CM | POA: Diagnosis not present

## 2022-07-30 DIAGNOSIS — I10 Essential (primary) hypertension: Secondary | ICD-10-CM

## 2022-07-30 DIAGNOSIS — I469 Cardiac arrest, cause unspecified: Secondary | ICD-10-CM

## 2022-07-30 DIAGNOSIS — I4901 Ventricular fibrillation: Secondary | ICD-10-CM | POA: Diagnosis not present

## 2022-07-30 DIAGNOSIS — G9341 Metabolic encephalopathy: Secondary | ICD-10-CM | POA: Diagnosis not present

## 2022-07-30 HISTORY — PX: SUBQ ICD IMPLANT: EP1223

## 2022-07-30 LAB — SURGICAL PCR SCREEN
MRSA, PCR: NEGATIVE
Staphylococcus aureus: NEGATIVE

## 2022-07-30 SURGERY — SUBQ ICD IMPLANT
Anesthesia: General

## 2022-07-30 MED ORDER — ONDANSETRON HCL 4 MG/2ML IJ SOLN
INTRAMUSCULAR | Status: DC | PRN
  Administered 2022-07-30: 4 mg via INTRAVENOUS

## 2022-07-30 MED ORDER — CEFAZOLIN SODIUM-DEXTROSE 2-4 GM/100ML-% IV SOLN
INTRAVENOUS | Status: AC
  Filled 2022-07-30: qty 100

## 2022-07-30 MED ORDER — LACTATED RINGERS IV SOLN: INTRAVENOUS | Status: DC | PRN

## 2022-07-30 MED ORDER — SUGAMMADEX SODIUM 200 MG/2ML IV SOLN
INTRAVENOUS | Status: DC | PRN
  Administered 2022-07-30: 200 mg via INTRAVENOUS

## 2022-07-30 MED ORDER — SODIUM CHLORIDE 0.9 % IV SOLN
INTRAVENOUS | Status: AC
  Filled 2022-07-30: qty 2

## 2022-07-30 MED ORDER — MIDAZOLAM HCL 5 MG/5ML IJ SOLN
INTRAMUSCULAR | Status: DC | PRN
  Administered 2022-07-30: 2 mg via INTRAVENOUS

## 2022-07-30 MED ORDER — FENTANYL CITRATE (PF) 100 MCG/2ML IJ SOLN
25.0000 ug | INTRAMUSCULAR | Status: DC | PRN
  Administered 2022-07-30: 50 ug via INTRAVENOUS

## 2022-07-30 MED ORDER — ROCURONIUM BROMIDE 10 MG/ML (PF) SYRINGE
PREFILLED_SYRINGE | INTRAVENOUS | Status: DC | PRN
  Administered 2022-07-30: 60 mg via INTRAVENOUS
  Administered 2022-07-30: 20 mg via INTRAVENOUS

## 2022-07-30 MED ORDER — SODIUM CHLORIDE 0.9 % IV SOLN: INTRAVENOUS | Status: DC

## 2022-07-30 MED ORDER — CHLORHEXIDINE GLUCONATE 4 % EX LIQD: 60.0000 mL | Freq: Once | CUTANEOUS | Status: DC

## 2022-07-30 MED ORDER — CEFAZOLIN SODIUM-DEXTROSE 1-4 GM/50ML-% IV SOLN
1.0000 g | Freq: Four times a day (QID) | INTRAVENOUS | Status: AC
  Administered 2022-07-30 – 2022-07-31 (×3): 1 g via INTRAVENOUS
  Filled 2022-07-30 (×3): qty 50

## 2022-07-30 MED ORDER — FENTANYL CITRATE PF 50 MCG/ML IJ SOSY
25.0000 ug | PREFILLED_SYRINGE | INTRAMUSCULAR | Status: DC | PRN
  Administered 2022-07-30 – 2022-07-31 (×2): 50 ug via INTRAVENOUS
  Filled 2022-07-30 (×2): qty 1

## 2022-07-30 MED ORDER — BUPIVACAINE HCL (PF) 0.25 % IJ SOLN
INTRAMUSCULAR | Status: DC | PRN
  Administered 2022-07-30: 60 mL

## 2022-07-30 MED ORDER — SODIUM CHLORIDE 0.9 % IV SOLN
80.0000 mg | INTRAVENOUS | Status: AC
  Administered 2022-07-30: 80 mg
  Filled 2022-07-30: qty 2

## 2022-07-30 MED ORDER — DEXAMETHASONE SODIUM PHOSPHATE 10 MG/ML IJ SOLN
INTRAMUSCULAR | Status: DC | PRN
  Administered 2022-07-30: 8 mg via INTRAVENOUS

## 2022-07-30 MED ORDER — FENTANYL CITRATE (PF) 100 MCG/2ML IJ SOLN
INTRAMUSCULAR | Status: AC
  Filled 2022-07-30: qty 2

## 2022-07-30 MED ORDER — ACETAMINOPHEN 325 MG PO TABS: 325.0000 mg | ORAL_TABLET | ORAL | Status: DC | PRN

## 2022-07-30 MED ORDER — PROPOFOL 10 MG/ML IV BOLUS
INTRAVENOUS | Status: DC | PRN
  Administered 2022-07-30 (×2): 40 mg via INTRAVENOUS
  Administered 2022-07-30: 90 mg via INTRAVENOUS

## 2022-07-30 MED ORDER — HEPARIN (PORCINE) IN NACL 1000-0.9 UT/500ML-% IV SOLN
INTRAVENOUS | Status: DC | PRN
  Administered 2022-07-30: 500 mL

## 2022-07-30 MED ORDER — CHLORHEXIDINE GLUCONATE 4 % EX LIQD
60.0000 mL | Freq: Once | CUTANEOUS | Status: AC
  Administered 2022-07-30: 4 via TOPICAL
  Filled 2022-07-30: qty 60

## 2022-07-30 MED ORDER — CEFAZOLIN SODIUM-DEXTROSE 2-4 GM/100ML-% IV SOLN
2.0000 g | INTRAVENOUS | Status: AC
  Administered 2022-07-30: 2 g via INTRAVENOUS
  Filled 2022-07-30: qty 100

## 2022-07-30 MED ORDER — FENTANYL CITRATE (PF) 250 MCG/5ML IJ SOLN
INTRAMUSCULAR | Status: DC | PRN
  Administered 2022-07-30: 100 ug via INTRAVENOUS

## 2022-07-30 MED ORDER — LIDOCAINE 2% (20 MG/ML) 5 ML SYRINGE
INTRAMUSCULAR | Status: DC | PRN
  Administered 2022-07-30: 100 mg via INTRAVENOUS

## 2022-07-30 MED ORDER — ONDANSETRON HCL 4 MG/2ML IJ SOLN: 4.0000 mg | Freq: Four times a day (QID) | INTRAMUSCULAR | Status: DC | PRN

## 2022-07-30 SURGICAL SUPPLY — 6 items
BLANKET WARM UNDERBOD FULL ACC (MISCELLANEOUS) IMPLANT
CABLE SURGICAL S-101-97-12 (CABLE) ×1 IMPLANT
ICD SUBCU MRI EMBLEM A219 (ICD Generator) IMPLANT
LEAD SUBQU EMBLEM 3501 (Pacemaker) IMPLANT
PAD DEFIB RADIO PHYSIO CONN (PAD) ×1 IMPLANT
TRAY PACEMAKER INSERTION (PACKS) ×1 IMPLANT

## 2022-07-30 NOTE — Anesthesia Preprocedure Evaluation (Addendum)
Anesthesia Evaluation  Patient identified by MRN, date of birth, ID band Patient awake    Reviewed: Allergy & Precautions, NPO status , Patient's Chart, lab work & pertinent test results  History of Anesthesia Complications Negative for: history of anesthetic complications  Airway Mallampati: II   Neck ROM: Full    Dental no notable dental hx. (+) Dental Advisory Given   Pulmonary neg pulmonary ROS   Pulmonary exam normal        Cardiovascular hypertension, Normal cardiovascular exam  Hx of Cardiac Arrest, s/p CPR Cath 07/23/22  Impression No impression found.  Narrative 1.  Normal coronary arteries. 2.  Left ventricular angiography was not performed.  EF was 50 to 55% by echo. 3.  Mildly elevated left ventricular end-diastolic pressure at 14 mmHg.   Recommendations: No ischemic etiology is identified for cardiac arrest.     Neuro/Psych  PSYCHIATRIC DISORDERS  Depression    Metabolic ecephalopathy negative neurological ROS     GI/Hepatic negative GI ROS, Neg liver ROS,,,  Endo/Other  negative endocrine ROS    Renal/GU negative Renal ROS     Musculoskeletal negative musculoskeletal ROS (+)    Abdominal   Peds  Hematology negative hematology ROS (+)   Anesthesia Other Findings   Reproductive/Obstetrics                             Anesthesia Physical Anesthesia Plan  ASA: 3  Anesthesia Plan: General   Post-op Pain Management: Minimal or no pain anticipated   Induction: Intravenous  PONV Risk Score and Plan: 3 and Ondansetron, Dexamethasone and Treatment may vary due to age or medical condition  Airway Management Planned: Oral ETT  Additional Equipment:   Intra-op Plan:   Post-operative Plan: Extubation in OR  Informed Consent: I have reviewed the patients History and Physical, chart, labs and discussed the procedure including the risks, benefits and alternatives for  the proposed anesthesia with the patient or authorized representative who has indicated his/her understanding and acceptance.     Dental advisory given  Plan Discussed with: Anesthesiologist and CRNA  Anesthesia Plan Comments:        Anesthesia Quick Evaluation

## 2022-07-30 NOTE — Anesthesia Procedure Notes (Addendum)
Procedure Name: Intubation Date/Time: 07/30/2022 3:18 PM  Performed by: Inda Coke, CRNAPre-anesthesia Checklist: Patient identified, Emergency Drugs available, Suction available, Timeout performed and Patient being monitored Patient Re-evaluated:Patient Re-evaluated prior to induction Oxygen Delivery Method: Circle system utilized Preoxygenation: Pre-oxygenation with 100% oxygen Induction Type: IV induction Ventilation: Mask ventilation without difficulty Laryngoscope Size: Mac and 3 Grade View: Grade I Tube type: Oral Tube size: 7.0 mm Number of attempts: 1 Airway Equipment and Method: Stylet Placement Confirmation: ETT inserted through vocal cords under direct vision, positive ETCO2, CO2 detector and breath sounds checked- equal and bilateral Secured at: 22 cm Tube secured with: Tape Dental Injury: Teeth and Oropharynx as per pre-operative assessment

## 2022-07-30 NOTE — Anesthesia Postprocedure Evaluation (Signed)
Anesthesia Post Note  Patient: Alexandra Freeman  Procedure(s) Performed: RADIOLOGY WITH ANESTHESIA MRI CARDIOLOGY     Patient location during evaluation: PACU Anesthesia Type: General Level of consciousness: awake Pain management: pain level controlled Vital Signs Assessment: post-procedure vital signs reviewed and stable Respiratory status: spontaneous breathing Cardiovascular status: stable Postop Assessment: no apparent nausea or vomiting Anesthetic complications: no  No notable events documented.  Last Vitals:  Vitals:   07/29/22 2045 07/30/22 0636  BP: 133/76 (!) 109/57  Pulse: 83 (!) 58  Resp: 16   Temp: 36.9 C 36.6 C  SpO2: 100% 91%    Last Pain:  Vitals:   07/30/22 0800  TempSrc:   PainSc: 0-No pain                 Huston Foley

## 2022-07-30 NOTE — Progress Notes (Signed)
  Patient Name: Alexandra Freeman Date of Encounter: 07/30/2022  Primary Cardiologist: None Electrophysiologist: None  Interval Summary   The patient is doing well today.  At this time, the patient denies chest pain, shortness of breath, or any new concerns.  cMRI and Brain MRI unremarkable. No LGE noted. EF normal range.   Inpatient Medications    Scheduled Meds:  diclofenac Sodium  2 g Topical QID   docusate sodium  100 mg Oral BID   lidocaine  1-2 patch Transdermal Q24H   magnesium oxide  400 mg Oral Daily   sodium chloride flush  3 mL Intravenous Q12H   traZODone  50 mg Oral QHS   Continuous Infusions:  sodium chloride     promethazine (PHENERGAN) injection (IM or IVPB) Stopped (07/22/22 1227)   PRN Meds: sodium chloride, acetaminophen, docusate sodium, ondansetron (ZOFRAN) IV, mouth rinse, mouth rinse, mouth rinse, polyethylene glycol, promethazine (PHENERGAN) injection (IM or IVPB) **OR** promethazine, sodium chloride flush   Vital Signs    Vitals:   07/29/22 1400 07/29/22 1700 07/29/22 2045 07/30/22 0636  BP: 132/83 131/83 133/76 (!) 109/57  Pulse: 62 (!) 102 83 (!) 58  Resp: 17 18 16   $ Temp: 98.8 F (37.1 C) 98.3 F (36.8 C) 98.5 F (36.9 C) 97.8 F (36.6 C)  TempSrc: Oral Oral Oral Oral  SpO2: 100% 100% 100% 91%  Weight:      Height:        Intake/Output Summary (Last 24 hours) at 07/30/2022 0721 Last data filed at 07/29/2022 2200 Gross per 24 hour  Intake 1370 ml  Output --  Net 1370 ml   Filed Weights   07/27/22 0613 07/28/22 1000 07/29/22 0908  Weight: 70 kg 65.9 kg 65.2 kg    Physical Exam    GEN- The patient is well appearing, alert and oriented x 3 today.   Lungs- Clear to ausculation bilaterally, normal work of breathing Cardiac- Regular rate and rhythm, no murmurs, rubs or gallops GI- soft, NT, ND, + BS Extremities- no clubbing or cyanosis. No edema  Telemetry    NSR in 60s (personally reviewed)  Patient profile    27 y.o.  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 admitted with cardiac arrest    EMS report reviewed On their arrival [facility] on scene,(no bystander CPR per our record was performed, perhaps 6-7 minutes), bystanders reported they had all been smoking marijuana when the patient collapsed 21:16 EMS: Found pulseless and apneic CPR started on their arrival BVM > iGEL airway Initial rhythm is VF Defibrillated 360J IO achieved >  epi  Defibrillated 360J Amiodarone 300 Epi CPR stopped >>  21:24 cardioverted 100J (?VT) Final rhythm is SR 70's Fentanyl IV established Epinephrine gtt started Narcan given  Assessment & Plan    Cardiac arrest, out of hospital VF Echo 50-55% cMRI without LGE, EF 55%, RV 38% (moderate dysfunction).  Will plan genetic testing as an outpatient Explained risks, benefits, and alternatives to S-ICD implantation, including but not limited to bleeding and infection. Pt verbalizes understanding and agrees to proceed.    For questions or updates, please contact [place] Please consult [email or web address] for contact info under Cardiology/STEMI.  Signed, Shirley Friar, PA-C  07/30/2022, 7:21 AM

## 2022-07-30 NOTE — Progress Notes (Signed)
PROGRESS NOTE    Alexandra Freeman  [id number] DOB: 1995-08-21 DOA: 07/20/2022 PCP: Patient, No Pcp Per  Chief Complaint  Patient presents with   POST CPR    Brief Narrative:  27 year old female with with past medical history of menorrhagia on birth control who presented to the emergency room on 2/18 for sudden onset cardiac arrest.  Patient received some CPR prior to EMS arrival and then found to be in V-fib arrest and defibrillated.  Intubated en route to emergency room.  Labs unremarkable with urine drug screen only positive for THC.  Patient initially on critical care service.  During hospitalization, workup unrevealing.  EEG and echocardiogram unremarkable.  Patient able to be extubated by 2/20.  Cardiology consulted and patient underwent left heart cath which was unremarkable.  Cardiology plans to place ICD on 2/28.   Assessment & Plan:   Principal Problem:   Cardiac arrest [facility]) Active Problems:   Acute respiratory failure (HCC)   AKI (acute kidney injury) ([place])   Aspiration pneumonia (HCC)   Encephalopathy acute   Nausea and vomiting   Hypokalemia   Hypophosphatemia   Acute metabolic encephalopathy   Ventricular fibrillation (HCC)  Witnessed VF arrest  - unclear cause.  Patient with no family history.  Electrolytes are stable on admission.  Patient does not drink energy drinks, urine drug screen unremarkable and takes an very little caffeine.  Mentation has since normalized.  Cardiac MRI unremarkable.  Plan is for defibrillator placement 2/28.   Acute encephalopathy-resolved MRI unremarkable.   Possible aspiration-following extubation Completed short course unasyn   Transaminitis -resolved most likely in setting of arrest downtrending   Elevated trops  Per cards, w/u for arrest as noted above LHC without CAD  N/v  resolved   Anemia, stable -PRN CBC   Hypophosphatemia Hypokalemia  P -replace -Cont to trend   Insomnia Trazodone    DVT  prophylaxis: frequent ambulation, d/c heparin due to her request - she's up and walking frequently   Code Status: full Family Communication: One of her mothers is at bedside Disposition:   Status is: Inpatient Remains inpatient appropriate because: ICD placement   Consultants:  Cardiology Critical care  Procedures:  LHC 1.  Normal coronary arteries. 2.  Left ventricular angiography was not performed.  EF was 50 to 55% by echo. 3.  Mildly elevated left ventricular end-diastolic pressure at 14 mmHg.   Recommendations: No ischemic etiology is identified for cardiac arrest.  ICD placement  Echo IMPRESSIONS     1. Left ventricular ejection fraction, by estimation, is 50 to 55%. The  left ventricle has low normal function. The left ventricle has no regional  wall motion abnormalities. Left ventricular diastolic parameters were  normal.   2. Right ventricular systolic function is normal. The right ventricular  size is normal. There is mildly elevated pulmonary artery systolic  pressure. The estimated right ventricular systolic pressure is [id number] mmHg.   3. The mitral valve is normal in structure. No evidence of mitral valve  regurgitation. No evidence of mitral stenosis.   4. The aortic valve is tricuspid. Aortic valve regurgitation is not  visualized. No aortic stenosis is present.   5. The inferior vena cava is dilated in size with <50% respiratory  variability, suggesting right atrial pressure of 15 mmHg.   Antimicrobials:  Anti-infectives (From admission, onward)    Start     Dose/Rate Route Frequency Ordered Stop   07/30/22 1430  gentamicin (GARAMYCIN) 80 mg in sodium chloride 0.9 %  500 mL irrigation        80 mg Irrigation To ShortStay Procedural 07/30/22 0941 07/31/22 1430   07/30/22 1430  ceFAZolin (ANCEF) IVPB 2g/100 mL premix        2 g 200 mL/hr over 30 Minutes Intravenous To ShortStay Surgical 07/30/22 0941 07/31/22 1430   07/21/22 1300  Ampicillin-Sulbactam  (UNASYN) 3 g in sodium chloride 0.9 % 100 mL IVPB        3 g 200 mL/hr over 30 Minutes Intravenous Every 8 hours 07/21/22 1206 07/24/22 0522       Subjective: Patient with no complaints  Objective: Vitals:   07/29/22 1700 07/29/22 2045 07/30/22 0636 07/30/22 1101  BP: 131/83 133/76 (!) 109/57   Pulse: (!) 102 83 (!) 58   Resp: 18 16    Temp: 98.3 F (36.8 C) 98.5 F (36.9 C) 97.8 F (36.6 C)   TempSrc: Oral Oral Oral   SpO2: 100% 100% 91%   Weight:    65 kg  Height:        Intake/Output Summary (Last 24 hours) at 07/30/2022 1427 Last data filed at 07/29/2022 2200 Gross per 24 hour  Intake 480 ml  Output --  Net 480 ml    Filed Weights   07/28/22 1000 07/29/22 0908 07/30/22 1101  Weight: 65.9 kg 65.2 kg 65 kg    Examination:  General: No acute distress.  Alert and oriented x 3 Cardiovascular: Regular rate and rhythm, S1-S2 Lungs: Clear to auscultation bilaterally Neurological: No focal deficits Psychiatry: Patient is appropriate, no evidence of psychoses Extremities: No clubbing or cyanosis. No edema.  Data Reviewed: I have personally reviewed following labs and imaging studies  CBC: Recent Labs  Lab 07/25/22 0640 07/26/22 1036  WBC 6.8 6.2  NEUTROABS 4.8 4.3  HGB 10.8* 12.1  HCT 31.6* 35.4*  MCV 90.5 89.4  PLT 173 225     Basic Metabolic Panel: Recent Labs  Lab 07/24/22 0253 07/25/22 0640 07/26/22 1036 07/27/22 0622 07/28/22 0625  NA 139 139 139 137 137  K 3.7 3.4* 3.7 4.1 3.5  CL 110 110 105 106 107  CO2 21* 20* '23 24 22  '$ GLUCOSE 82 83 115* 90 90  BUN 5* 5* <5* 7 11  CREATININE 0.87 0.92 1.00 1.08* 0.86  CALCIUM 8.3* 8.5* 8.8* 8.5* 8.6*  MG  --  1.8 1.9 1.9 2.0  PHOS  --  3.3 3.7  --   --      GFR: Estimated Creatinine Clearance: 88.4 mL/min (by C-G formula based on SCr of 0.86 mg/dL).  Liver Function Tests: Recent Labs  Lab 07/24/22 0253 07/25/22 0640 07/26/22 1036 07/27/22 0622  AST 187* 107* 72* 49*  ALT 65* 53* 46* 38   ALKPHOS 62 54 62 53  BILITOT 0.7 0.5 0.7 0.5  PROT 6.1* 6.2* 6.8 6.1*  ALBUMIN 3.2* 3.2* 3.7 3.3*     CBG: No results for input(s): "GLUCAP" in the last 168 hours.    Recent Results (from the past 240 hour(s))  MRSA Next Gen by PCR, Nasal     Status: None   Collection Time: 07/21/22  1:01 AM   Specimen: Nasal Mucosa; Nasal Swab  Result Value Ref Range Status   MRSA by PCR Next Gen NOT DETECTED NOT DETECTED Final    Comment: (NOTE) The GeneXpert MRSA Assay (FDA approved for NASAL specimens only), is one component of a comprehensive MRSA colonization surveillance program. It is not intended to diagnose MRSA infection nor to guide  or monitor treatment for MRSA infections. Test performance is not FDA approved in patients less than 37 years old. Performed at [facility], [place] [address], [place], [place] [address]   Resp panel by RT-PCR (RSV, Flu A&B, Covid) Anterior Nasal Swab     Status: None   Collection Time: 07/21/22  2:09 AM   Specimen: Anterior Nasal Swab  Result Value Ref Range Status   SARS Coronavirus 2 by RT PCR NEGATIVE NEGATIVE Final   Influenza A by PCR NEGATIVE NEGATIVE Final   Influenza B by PCR NEGATIVE NEGATIVE Final    Comment: (NOTE) The Xpert Xpress SARS-CoV-2/FLU/RSV plus assay is intended as an aid in the diagnosis of influenza from Nasopharyngeal swab specimens and should not be used as a sole basis for treatment. Nasal washings and aspirates are unacceptable for Xpert Xpress SARS-CoV-2/FLU/RSV testing.  Fact Sheet for Patients: EntrepreneurPulse.com.au  Fact Sheet for Healthcare Providers: IncredibleEmployment.be  This test is not yet approved or cleared by the [place] FDA and has been authorized for detection and/or diagnosis of SARS-CoV-2 by FDA under an Emergency Use Authorization (EUA). This EUA will remain in effect (meaning this test can be used) for the duration of the COVID-19 declaration  under Section 564(b)(1) of the Act, 21 U.S.C. section 360bbb-3(b)(1), unless the authorization is terminated or revoked.     Resp Syncytial Virus by PCR NEGATIVE NEGATIVE Final    Comment: (NOTE) Fact Sheet for Patients: EntrepreneurPulse.com.au  Fact Sheet for Healthcare Providers: IncredibleEmployment.be  This test is not yet approved or cleared by the [place] FDA and has been authorized for detection and/or diagnosis of SARS-CoV-2 by FDA under an Emergency Use Authorization (EUA). This EUA will remain in effect (meaning this test can be used) for the duration of the COVID-19 declaration under Section 564(b)(1) of the Act, 21 U.S.C. section 360bbb-3(b)(1), unless the authorization is terminated or revoked.  Performed at [facility], [place] [address], [place], [place] [address]   Surgical PCR screen     Status: None   Collection Time: 07/30/22  9:42 AM   Specimen: Nasal Mucosa; Nasal Swab  Result Value Ref Range Status   MRSA, PCR NEGATIVE NEGATIVE Final   Staphylococcus aureus NEGATIVE NEGATIVE Final    Comment: (NOTE) The Xpert SA Assay (FDA approved for NASAL specimens in patients 51 years of age and older), is one component of a comprehensive surveillance program. It is not intended to diagnose infection nor to guide or monitor treatment. Performed at [facility], [place] [address] [place], [place] [address]          Radiology Studies: MR CARDIAC VELOCITY FLOW MAP  Result Date: 07/29/2022 CLINICAL DATA:  59F p/w cardiac arrest. Echo with EF 50-55%, normal RV function, no significant valvular disease. EXAM: CARDIAC MRI TECHNIQUE: The patient was scanned on a 1.5 Tesla Siemens magnet. A dedicated cardiac coil was used. Functional imaging was done using Fiesta sequences. 2,3, and 4 chamber views were done to assess for RWMA's. Modified Simpson's rule using a short axis stack was used to calculate an ejection fraction  on a dedicated work Conservation officer, nature. The patient received 7 cc of Gadavist. After 10 minutes inversion recovery sequences were used to assess for infiltration and scar tissue. Phase contrast velocity mapping was performed above the aortic and pulmonic valves CONTRAST:  7 cc  of Gadavist FINDINGS: Left ventricle: -Normal wall thickness -Normal size -Normal systolic function -Mild ECV elevation (30%) -No LGE  LV EF:  55% (Normal 52-79%) Absolute volumes: LV EDV: 12m (Normal 78-167 mL) LV ESV: 555m(Normal 21-64 mL) LV SV: 6549mNormal 52-114 mL) CO: 3.4L/min (Normal 2.7-6.3 L/min) Indexed volumes: LV EDV: 45m53m-m (Normal 50-96 mL/sq-m) LV ESV: 31mL49mm (Normal 10-40 mL/sq-m) LV SV: 38mL/57m (Normal 33-64 mL/sq-m) CI: 2.0L/min/sq-m (Normal 1.9-3.9 L/min/sq-m) Right ventricle: Mild dilatation with moderate systolic dysfunction RV EF: 38% (Normal 52-80%) Absolute volumes: RV EDV: 169mL (45mal 79-175 mL) RV ESV: 105mL (N2ml 13-75 mL) RV SV: 64mL (No69m 56-110 mL) CO: 3.3L/min (Normal 2.7-6 L/min) Indexed volumes: RV EDV: 98mL/sq-m58mrmal 51-97 mL/sq-m) RV ESV: 61mL/sq-m 10mmal 9-42 mL/sq-m) RV SV: 37mL/sq-m (33mal 35-61 mL/sq-m) CI: 1.9L/min/sq-m (Normal 1.8-3.8 L/min/sq-m) Left atrium: Normal size Right atrium: Mild enlargement Mitral valve: No regurgitation Aortic valve: No regurgitation Tricuspid valve: Trivial regurgitation Pulmonic valve: Trivial regurgitation Aorta: Normal proximal ascending aorta Pericardium: Small effusion IMPRESSION: 1.  Normal LV size, wall thickness, and systolic function (EF 55%) 2.  [id number] dilatation with moderate systolic dysfunction (EF 38%) 3.  No [id number] gadolinium enhancement Electronically Signed   By: Christopher Oswaldo Milian02/27/2024 22:22   MR CARDIAC VELOCITY FLOW MAP  Result Date: 07/29/2022 CLINICAL DATA:  14F p/w cardiac arrest. Echo with EF 50-55%, normal RV function, no significant valvular disease. EXAM: CARDIAC MRI TECHNIQUE: The patient  was scanned on a 1.5 Tesla Siemens magnet. A dedicated cardiac coil was used. Functional imaging was done using Fiesta sequences. 2,3, and 4 chamber views were done to assess for RWMA's. Modified Simpson's rule using a short axis stack was used to calculate an ejection fraction on a dedicated work station usinConservation officer, naturet received 7 cc of Gadavist. After 10 minutes inversion recovery sequences were used to assess for infiltration and scar tissue. Phase contrast velocity mapping was performed above the aortic and pulmonic valves CONTRAST:  7 cc  of Gadavist FINDINGS: Left ventricle: -Normal wall thickness -Normal size -Normal systolic function -Mild ECV elevation (30%) -No LGE LV EF:  55% (Normal 52-79%) Absolute volumes: LV EDV: 118mL (Normal75m167 mL) LV ESV: 53mL (Normal 25m4 mL) LV SV: 65mL (Normal 514m4 mL) CO: 3.4L/min (Normal 2.7-6.3 L/min) Indexed volumes: LV EDV: 45mL/sq-m (Norm76m0-96 mL/sq-m) LV ESV: 31mL/sq-m (Norma32m-40 mL/sq-m) LV SV: 38mL/sq-m (Normal29m64 mL/sq-m) CI: 2.0L/min/sq-m (Normal 1.9-3.9 L/min/sq-m) Right ventricle: Mild dilatation with moderate systolic dysfunction RV EF: 38% (Normal 52-80%) Absolute volumes: RV EDV: 169mL (Normal 79-1772m) RV ESV: 105mL (Normal 13-75 36mRV SV: 64mL (Normal 56-110 60mCO: 3.3L/min (Normal 2.7-6 L/min) Indexed volumes: RV EDV: 98mL/sq-m (Normal 51-23mL/sq-m) RV ESV: 61mL/sq-m (Normal 9-4221msq-m) RV SV: 37mL/sq-m (Normal 35-6131msq-m) CI: 1.9L/min/sq-m (Normal 1.8-3.8 L/min/sq-m) Left atrium: Normal size Right atrium: Mild enlargement Mitral valve: No regurgitation Aortic valve: No regurgitation Tricuspid valve: Trivial regurgitation Pulmonic valve: Trivial regurgitation Aorta: Normal proximal ascending aorta Pericardium: Small effusion IMPRESSION: 1.  Normal LV size, wall thickness, and systolic function (EF 55%) 2.  Mild RV [id number] with moderate systolic dysfunction (EF 38%) 3.  No late [id number] enhancement Electronically  Signed   By: Christopher  Schumann M.Oswaldo Milian2:21   MR CARDIAC MORPHOLOGY W WO CONTRAST  Result Date: 07/29/2022 CLINICAL DATA:  14F p/w cardiac arrest. Echo with EF 50-55%, normal RV function, no significant valvular disease. EXAM: CARDIAC MRI TECHNIQUE: The patient was scanned on a 1.5 Tesla Siemens magnet. A dedicated cardiac coil was used. Functional imaging was done using Fiesta sequences. 2,3, and 4  chamber views were done to assess for RWMA's. Modified Simpson's rule using a short axis stack was used to calculate an ejection fraction on a dedicated work Conservation officer, nature. The patient received 7 cc of Gadavist. After 10 minutes inversion recovery sequences were used to assess for infiltration and scar tissue. Phase contrast velocity mapping was performed above the aortic and pulmonic valves CONTRAST:  7 cc  of Gadavist FINDINGS: Left ventricle: -Normal wall thickness -Normal size -Normal systolic function -Mild ECV elevation (30%) -No LGE LV EF:  55% (Normal 52-79%) Absolute volumes: LV EDV: 173m (Normal 78-167 mL) LV ESV: 565m(Normal 21-64 mL) LV SV: 658mNormal 52-114 mL) CO: 3.4L/min (Normal 2.7-6.3 L/min) Indexed volumes: LV EDV: 21m1m-m (Normal 50-96 mL/sq-m) LV ESV: 31mL32mm (Normal 10-40 mL/sq-m) LV SV: 38mL/48m (Normal 33-64 mL/sq-m) CI: 2.0L/min/sq-m (Normal 1.9-3.9 L/min/sq-m) Right ventricle: Mild dilatation with moderate systolic dysfunction RV EF: 38% (Normal 52-80%) Absolute volumes: RV EDV: 169mL (15mal 79-175 mL) RV ESV: 105mL (N47ml 13-75 mL) RV SV: 64mL (No72m 56-110 mL) CO: 3.3L/min (Normal 2.7-6 L/min) Indexed volumes: RV EDV: 98mL/sq-m104mrmal 51-97 mL/sq-m) RV ESV: 61mL/sq-m 71mmal 9-42 mL/sq-m) RV SV: 37mL/sq-m (56mal 35-61 mL/sq-m) CI: 1.9L/min/sq-m (Normal 1.8-3.8 L/min/sq-m) Left atrium: Normal size Right atrium: Mild enlargement Mitral valve: No regurgitation Aortic valve: No regurgitation Tricuspid valve: Trivial regurgitation Pulmonic valve:  Trivial regurgitation Aorta: Normal proximal ascending aorta Pericardium: Small effusion IMPRESSION: 1.  Normal LV size, wall thickness, and systolic function (EF 55%) 2.  [id number] dilatation with moderate systolic dysfunction (EF 38%) 3.  No [id number] gadolinium enhancement Electronically Signed   By: Christopher Oswaldo Milian02/27/2024 22:21   MR BRAIN WO CONTRAST  Result Date: 07/29/2022 CLINICAL DATA:  Mental status change, unknown cause. EXAM: MRI HEAD WITHOUT CONTRAST TECHNIQUE: Multiplanar, multiecho pulse sequences of the brain and surrounding structures were obtained without intravenous contrast. COMPARISON:  Head CT 07/20/2022.  MRI brain 07/23/2022. FINDINGS: Brain: No acute infarct or hemorrhage. No mass or midline shift. No hydrocephalus or extra-axial collection. Basilar cisterns are patent. Vascular: Normal flow voids. Skull and upper cervical spine: Normal marrow signal. Sinuses/Orbits: Unremarkable. Other: None. IMPRESSION: Normal brain MRI. Electronically Signed   By: Walter  WiggEmmit Alexanders02/27/2024 14:26        Scheduled Meds:  diclofenac Sodium  2 g Topical QID   docusate sodium  100 mg Oral BID   gentamicin (GARAMYCIN) 80 mg in sodium chloride 0.9 % 500 mL irrigation  80 mg Irrigation to SS-Proc   lidocaine  1-2 patch Transdermal Q24H   magnesium oxide  400 mg Oral Daily   sodium chloride flush  3 mL Intravenous Q12H   traZODone  50 mg Oral QHS   Continuous Infusions:  sodium chloride     sodium chloride 50 mL/hr at 07/30/22 1041    ceFAZolin (ANCEF) IV     promethazine (PHENERGAN) injection (IM or IVPB) Stopped (07/22/22 1227)     LOS: 9 days     [facility] Sc[facility] K KriAnnita [facility]dospitalists   To contact the attending provider between 7A-7P or the covering provider during after hours 7P-7A, please log into the web site [email or web address] and access using universal [place] password for that web site. If you do not have the password, please call the hospital  operator.  07/30/2022, 2:27 PM

## 2022-07-30 NOTE — Hospital Course (Addendum)
Admitted 2/18 with OOH. VF on EMS arrival.  Echo 2/19 LVEF 50-55% [facility] 07/23/2022 LVEF 50-55%, mildly elevated LVEDP  Initially encephalopathic, has gradually improved.   cMRI 2/27 - No LGE, normal LVEF, RVEF 38% (mod down)  Underwent S-ICD 2/28  Genetic testing as outpatient.

## 2022-07-30 NOTE — Transfer of Care (Signed)
Immediate Anesthesia Transfer of Care Note  Patient: Alexandra Freeman  Procedure(s) Performed: SUBQ ICD IMPLANT  Patient Location: PACU  Anesthesia Type:General  Level of Consciousness: awake, alert , and oriented  Airway & Oxygen Therapy: Patient Spontanous Breathing and Patient connected to nasal cannula oxygen  Post-op Assessment: Report given to RN and Post -op Vital signs reviewed and stable  Post vital signs: Reviewed and stable  Last Vitals:  Vitals Value Taken Time  BP 129/81 07/30/22 1743  Temp    Pulse 57 07/30/22 1746  Resp 14 07/30/22 1746  SpO2 100 % 07/30/22 1746  Vitals shown include unvalidated device data.  Last Pain:  Vitals:   07/30/22 1737  TempSrc:   PainSc: 0-No pain      Patients Stated Pain Goal: 0 ([id number])  Complications: There were no known notable events for this encounter.

## 2022-07-31 ENCOUNTER — Other Ambulatory Visit (HOSPITAL_COMMUNITY): Payer: Self-pay

## 2022-07-31 ENCOUNTER — Encounter (HOSPITAL_COMMUNITY): Payer: Self-pay | Admitting: Cardiovascular Disease

## 2022-07-31 ENCOUNTER — Inpatient Hospital Stay (HOSPITAL_COMMUNITY): Payer: Medicaid Other

## 2022-07-31 DIAGNOSIS — I469 Cardiac arrest, cause unspecified: Secondary | ICD-10-CM | POA: Diagnosis not present

## 2022-07-31 DIAGNOSIS — J9601 Acute respiratory failure with hypoxia: Secondary | ICD-10-CM | POA: Diagnosis not present

## 2022-07-31 DIAGNOSIS — N179 Acute kidney failure, unspecified: Secondary | ICD-10-CM | POA: Diagnosis not present

## 2022-07-31 DIAGNOSIS — I4901 Ventricular fibrillation: Secondary | ICD-10-CM | POA: Diagnosis not present

## 2022-07-31 MED ORDER — KETOROLAC TROMETHAMINE 15 MG/ML IJ SOLN: 15.0000 mg | Freq: Four times a day (QID) | INTRAMUSCULAR | Status: DC | PRN

## 2022-07-31 MED ORDER — KETOROLAC TROMETHAMINE 15 MG/ML IJ SOLN
15.0000 mg | Freq: Four times a day (QID) | INTRAMUSCULAR | Status: DC | PRN
  Administered 2022-07-31: 15 mg via INTRAVENOUS
  Filled 2022-07-31: qty 1

## 2022-07-31 MED ORDER — TRAMADOL HCL 50 MG PO TABS
50.0000 mg | ORAL_TABLET | Freq: Three times a day (TID) | ORAL | 0 refills | Status: AC | PRN
  Filled 2022-07-31: qty 24, 5d supply, fill #0

## 2022-07-31 MED ORDER — HYDROMORPHONE HCL 1 MG/ML IJ SOLN
1.0000 mg | INTRAMUSCULAR | Status: DC | PRN
  Administered 2022-07-31: 1 mg via INTRAVENOUS
  Filled 2022-07-31: qty 1

## 2022-07-31 MED ORDER — OXYCODONE HCL 5 MG PO TABS
5.0000 mg | ORAL_TABLET | ORAL | Status: AC
  Administered 2022-07-31: 5 mg via ORAL
  Filled 2022-07-31: qty 1

## 2022-07-31 NOTE — Progress Notes (Signed)
  Patient Name: Alexandra Freeman Date of Encounter: 07/31/2022  Primary Cardiologist: None Electrophysiologist: Dr. Quentin Ore  Interval Summary   S/p S-ICD 2/28 by Dr. Myles Gip. Site appears stable. Denies pain this am. Excited to go home.   Inpatient Medications    Scheduled Meds:  diclofenac Sodium  2 g Topical QID   docusate sodium  100 mg Oral BID   lidocaine  1-2 patch Transdermal Q24H   magnesium oxide  400 mg Oral Daily   sodium chloride flush  3 mL Intravenous Q12H   traZODone  50 mg Oral QHS   Continuous Infusions:  sodium chloride 10 mL/hr at 07/31/22 0625    ceFAZolin (ANCEF) IV Stopped (07/31/22 0324)   promethazine (PHENERGAN) injection (IM or IVPB) Stopped (07/22/22 1227)   PRN Meds: sodium chloride, acetaminophen, docusate sodium, fentaNYL (SUBLIMAZE) injection, HYDROmorphone (DILAUDID) injection, ketorolac, ondansetron (ZOFRAN) IV, ondansetron (ZOFRAN) IV, mouth rinse, mouth rinse, mouth rinse, polyethylene glycol, promethazine (PHENERGAN) injection (IM or IVPB) **OR** promethazine, sodium chloride flush   Vital Signs    Vitals:   07/30/22 1837 07/30/22 2106 07/31/22 0136 07/31/22 0520  BP: 126/79 127/76 120/72 (!) 101/59  Pulse: (!) 57 96 78 72  Resp: 16 18  18  $ Temp: 98.1 F (36.7 C)   98.1 F (36.7 C)  TempSrc: Oral   Oral  SpO2: 100% 99% 100% 100%  Weight:    68.3 kg  Height:        Intake/Output Summary (Last 24 hours) at 07/31/2022 0746 Last data filed at 07/31/2022 0625 Gross per 24 hour  Intake 1183.63 ml  Output 620 ml  Net 563.63 ml   Filed Weights   07/29/22 0908 07/30/22 1101 07/31/22 0520  Weight: 65.2 kg 65 kg 68.3 kg    Physical Exam    GEN- The patient is well appearing, alert and oriented x 3 today.   Lungs- Clear to ausculation bilaterally, normal work of breathing Cardiac- Regular rate and rhythm, no murmurs, rubs or gallops GI- soft, NT, ND, + BS Extremities- no clubbing or cyanosis. No edema  Telemetry    NSR 70-90s  mostly (personally reviewed)  Hospital Course    Admitted 2/18 with OOH. VF on EMS arrival.  Echo 2/19 LVEF 50-55% [facility] 07/23/2022 LVEF 50-55%, mildly elevated LVEDP  Initially encephalopathic, has gradually improved.   cMRI 2/27 - No LGE, normal LVEF, RVEF 38% (mod down)  Underwent S-ICD 2/28  Genetic testing as outpatient.   Assessment & Plan    Cardiac arrest, out of hospital VF Echo 50-55% cMRI without LGE, EF 55%, RV 38% (moderate dysfunction).  Will plan genetic testing as an outpatient S-ICD check stable.  Reviewed wound care and arm restrictions personally with patient and placed in AVS.  Usual follow up schedule.  Short term pain medication sent to [place].   She is OK for discharge from an EP perspective, with f/u in place.   For questions or updates, please contact [place] Please consult [email or web address] for contact info under Cardiology/STEMI.  Signed, Shirley Friar, PA-C  07/31/2022, 7:46 AM

## 2022-07-31 NOTE — Discharge Instructions (Signed)
Subcutaneous Cardioverter Defibrillator Implantation, Care After  This sheet gives you information about how to care for yourself after your procedure. Your health care provider may also give you more specific instructions. If you have problems or questions, contact your health care provider.  What can I expect after the procedure? After the procedure, it is common to have: Some pain. It may last a few days. A slight bump under the skin where the subcutaneous implantation cardioverter defibrillator (S-ICD) is. You may be able to feel the device under the skin. This is normal.  Follow these instructions at home: Medicines Take over-the-counter and prescription medicines only as told by your health care provider.   Incision care   Follow instructions from your health care provider about how to take care of your incision. Make sure you: Leave stitches (sutures), skin glue, or adhesive strips in place. These skin closures may need to stay in place for 2 weeks or longer. If adhesive strip edges start to loosen and curl up, you may trim the loose edges. Do not remove adhesive strips completely unless your health care provider tells you to do that. Check your incision area every day for signs of infection. Check for: Redness, swelling, or pain. Fluid or blood. Warmth. Pus or a bad smell.  Activity Do not lift anything that is heavier than 10 lb (4.5 kg), or the limit that you are told, until your health care provider says that it is safe. Avoid sports and any other activity that could cause a hit to the generator or leads. Ask your health care provider what activities are safe for you and when you may return to your normal activities. Follow instructions from your health care provider about exercise and sexual activity restrictions after your procedure.  Electric and magnetic fields Tell all health care providers, including your dentist, that you have a defibrillator. They need to know this so  they do not give you an MRI scan, which uses strong magnets. When using your cell phone, hold it to the ear that is on the opposite side from the defibrillator. Do not leave your cell phone in a pocket over the defibrillator. If you must pass through a metal detector, quickly walk through it. Do not stop under the detector, and do not stand near it. Avoid places or objects that have a strong electric or magnetic field, including: Airport security gates. At the airport, tell officials that you have a defibrillator. Your defibrillator ID card will let you be checked in a way that is safe for you and will not damage your defibrillator. Also, do not let a security person wave a magnetic wand near your defibrillator. That can make it stop working. Power plants. Large electrical generators. Anti-theft systems or electronic article surveillance (EAS). Radiofrequency transmission towers, such as cell phone and radio towers. Do not use amateur (ham) radio equipment or electric (arc) welding torches. Some devices are safe to use if they are held 12 inches (30 cm) or more away from your defibrillator. These include power tools, lawn mowers, and speakers. If you are not sure if something is safe to use, ask your health care provider. Do not use MP3 player headphones. They have magnets. You may safely use electric blankets, heating pads, computers, and microwave ovens. General instructions Do not take baths, swim, shower, or use a hot tub until your health care provider approves. You may need to take sponge baths until your health care provider says that you may bathe or   shower. Do not drive until your health care provider approves. Always keep your defibrillator ID card with you. The card should list the implant date, device model, and manufacturer. Consider wearing a medical alert bracelet or necklace that says that you have an S-ICD. Do not use any products that contain nicotine or tobacco, such as cigarettes  and e-cigarettes. If you need help quitting, ask your health care provider. Have your defibrillator checked as often as told by your health care provider. Most S-ICDs last for 4-8 years before they need to be replaced.  Contact a health care provider if: You feel one shock in your chest. You gain weight suddenly. You have a fever. You have severe pain, and medicines do not help. You have redness, swelling, or pain around your incision area. You have pus or a bad smell coming from your incision area. You have fluid or blood coming from your incision. Your incision area feels warm to the touch. Your heart feels like it is fluttering or skipping beats (heart palpitations). You feel increased anxiety or depression.  Get help right away if: You feel more than one shock. You have chest pain. You have problems breathing or have shortness of breath. You have dizziness or fainting.  Summary After the procedure, you may have some pain, see a bump under your skin, and feel the device under your skin. Check your incision area every day for signs of infection. Be careful around electric and magnetic fields. Always keep your defibrillator ID card with you. You should receive this in 6-8 weeks  

## 2022-07-31 NOTE — Discharge Summary (Signed)
Physician Discharge Summary   Patient: Alexandra Freeman MRN: [id number] DOB: December 06, 1995  Admit date:     07/20/2022  Discharge date: 07/31/22  Discharge Physician: Annita Brod   PCP: Patient, No Pcp Per   Recommendations at discharge:   New medication: Ultram 50 mg 1-2 tabs p.o. every 8 hours as needed for next 5 days Patient will follow-up with electrophysiology  Discharge Diagnoses: Principal Problem:   Cardiac arrest [facility]) Active Problems:   Acute respiratory failure ([place])   AKI (acute kidney injury) ([place])   Aspiration pneumonia ([place])   Encephalopathy acute   Nausea and vomiting   Hypokalemia   Hypophosphatemia   Acute metabolic encephalopathy   Ventricular fibrillation ([place])  Resolved Problems:   * No resolved hospital problems. *  Hospital Course: 27 year old female with with past medical history of menorrhagia on birth control who presented to the emergency room on 2/18 for sudden onset cardiac arrest.  Patient received some CPR prior to EMS arrival and then found to be in V-fib arrest and defibrillated.  Intubated en route to emergency room.  Labs unremarkable with urine drug screen only positive for THC.  Patient initially on critical care service.   During hospitalization, workup unrevealing.  EEG and echocardiogram unremarkable.  Patient able to be extubated by 2/20.  Cardiology consulted and patient underwent left heart cath which was unremarkable.  Cardiology placed ICD on 2/28.  Assessment and Plan: Witnessed VF arrest  - unclear cause.  Patient with no family history.  Electrolytes are stable on admission.  Patient does not drink energy drinks, urine drug screen unremarkable and takes an very little caffeine.  Mentation has since normalized.  Cardiac MRI unremarkable.  Status post defibrillator placement 2/28.    Acute encephalopathy-resolved MRI unremarkable.   Possible aspiration-following extubation Completed short course unasyn   Transaminitis  -resolved most likely in setting of arrest downtrending   Elevated trops  Per cards, w/u for arrest as noted above LHC without CAD   N/v  resolved   Anemia, stable -PRN CBC   Hypophosphatemia Hypokalemia  P -replace -Cont to trend    Insomnia As needed trazodone         Pain control - [place] Controlled Substance Reporting System database was reviewed. and patient was instructed, not to drive, operate heavy machinery, perform activities at heights, swimming or participation in water activities or provide baby-sitting services while on Pain, Sleep and Anxiety Medications; until their outpatient Physician has advised to do so again. Also recommended to not to take more than prescribed Pain, Sleep and Anxiety Medications.  Consultants:  -Cardiology -Critical care  Procedures performed:  -Echocardiogram -ICD placement -Left heart catheterization  Disposition: Home Diet recommendation:  Discharge Diet Orders (From admission, onward)     Start     Ordered   07/31/22 0000  Diet general        07/31/22 1152           Regular diet DISCHARGE MEDICATION: Allergies as of 07/31/2022   Not on File      Medication List     STOP taking these medications    terbinafine 250 MG tablet Commonly known as: LAMISIL       TAKE these medications    acetaminophen 500 MG tablet Commonly known as: TYLENOL Take 500-1,000 mg by mouth every 6 (six) hours as needed for moderate pain.   ibuprofen 800 MG tablet Commonly known as: ADVIL Take 1 tablet (800 mg total) by mouth every  8 (eight) hours as needed (pain).   traMADol 50 MG tablet Commonly known as: Ultram Take 1-2 tablets (50-100 mg total) by mouth every 8 (eight) hours as needed for up to 5 days.        Discharge Exam: Filed Weights   07/29/22 0908 07/30/22 1101 07/31/22 0520  Weight: 65.2 kg 65 kg 68.3 kg   General: Alert and oriented x 3, no acute distress Cardiovascular: Regular rate and  rhythm, S1-S2  Condition at discharge: good  The results of significant diagnostics from this hospitalization (including imaging, microbiology, ancillary and laboratory) are listed below for reference.   Imaging Studies: DG Chest 2 View  Result Date: 07/31/2022 CLINICAL DATA:  Cardiac device in-situ EXAM: CHEST - 2 VIEW COMPARISON:  Chest radiograph 07/20/2022. FINDINGS: There is a new left chest wall cardiac device in place with a single lead terminating in the soft tissues superficial to the sternum. A small amount of subcutaneous emphysema is noted around the device. The cardiomediastinal silhouette is normal There is no focal consolidation or pulmonary edema. There is no pleural effusion. There is no pneumothorax There is no acute osseous abnormality. IMPRESSION: New left chest wall cardiac device in place without evidence of complication. No pneumothorax. Electronically Signed   By: Valetta Mole M.D.   On: 07/31/2022 08:21   EP PPM/ICD IMPLANT  Result Date: 07/30/2022 SURGEON: Doralee Albino, MD    PREPROCEDURE DIAGNOSES:  1. VF cardiac arrest    POSTPROCEDURE DIAGNOSES:  1. VF cardiac arrest    PROCEDURES:   1.  Subcutaneous ICD implantation.  INTRODUCTION:  Alexandra Freeman is a 27 y.o. female admitted for resuscitated sudden cardiac arrest due to VF without an identifiable, reversible cause.     DESCRIPTION OF PROCEDURE:  Informed written consent was obtained and the patient was brought to the electrophysiology lab in the fasting state. The patient was adequately sedated as outlined in the anesthesia report.  The patient's left chest was prepped and draped in the usual sterile fashion by the EP lab staff.  The skin overlying the left subaxillary and subxiphoid region was infiltrated with local anesthetic.  A 5-cm incision was made over the subaxillary region.  A 2 cm incision was made over the subxiphoid region.  Dissection was performed down to the xiphoid muscle.  A left subaxillary  submuscular defibrillator pocket was fashioned using a combination of sharp and blunt dissection.  Electrocautery was used to assure hemostasis.  Once both pockets were made, a tunneling tool was used to tunnel from the subaxillary to the xiphoid region.  An S ICD [employer] lead serial [id number] was passed through the tunneling tool sheath.  This lead was sutured down to the xiphoid using 0 ethibond suture.  A tunneling tool was used to tunnel up the sternum.  The lead was passed through the tunneling tool sheath.  The pockets then  irrigated with copious gentamicin solution.  The lead was then  connected to a [employer] MRI S ICD serial [phone].  The defibrillator was placed into the  pocket.  Both incisions were then closed in 3 layers of absorbable suture. EBL<59m.  Steri-Strips and a  sterile dressing were then applied.  DFT testing was performed. The device detected VF and delivered a 65J with restoration of sinus rhythm. Shock impedance was 45 ohms. Therapy was delivered in 12 seconds.   CONCLUSIONS:  1. Resuscitated sudden cardiac death due to V without reversible cause  2. Successful subcutaneous ICD implantation.  3. Successful defibrillation of VF  4. No early apparent complications. Doralee Albino, MD 07/30/2022 5:26 PM   MR CARDIAC VELOCITY FLOW MAP  Result Date: 07/29/2022 CLINICAL DATA:  38F p/w cardiac arrest. Echo with EF 50-55%, normal RV function, no significant valvular disease. EXAM: CARDIAC MRI TECHNIQUE: The patient was scanned on a 1.5 Tesla Siemens magnet. A dedicated cardiac coil was used. Functional imaging was done using Fiesta sequences. 2,3, and 4 chamber views were done to assess for RWMA's. Modified Simpson's rule using a short axis stack was used to calculate an ejection fraction on a dedicated work Conservation officer, nature. The patient received 7 cc of Gadavist. After 10 minutes inversion recovery sequences were used to assess for infiltration and scar  tissue. Phase contrast velocity mapping was performed above the aortic and pulmonic valves CONTRAST:  7 cc  of Gadavist FINDINGS: Left ventricle: -Normal wall thickness -Normal size -Normal systolic function -Mild ECV elevation (30%) -No LGE LV EF:  55% (Normal 52-79%) Absolute volumes: LV EDV: 186m (Normal 78-167 mL) LV ESV: 534m(Normal 21-64 mL) LV SV: 6524mNormal 52-114 mL) CO: 3.4L/min (Normal 2.7-6.3 L/min) Indexed volumes: LV EDV: 28m14m-m (Normal 50-96 mL/sq-m) LV ESV: 31mL10mm (Normal 10-40 mL/sq-m) LV SV: 38mL/34m (Normal 33-64 mL/sq-m) CI: 2.0L/min/sq-m (Normal 1.9-3.9 L/min/sq-m) Right ventricle: Mild dilatation with moderate systolic dysfunction RV EF: 38% (Normal 52-80%) Absolute volumes: RV EDV: 169mL (43mal 79-175 mL) RV ESV: 105mL (N94ml 13-75 mL) RV SV: 64mL (No69m 56-110 mL) CO: 3.3L/min (Normal 2.7-6 L/min) Indexed volumes: RV EDV: 98mL/sq-m39mrmal 51-97 mL/sq-m) RV ESV: 61mL/sq-m 40mmal 9-42 mL/sq-m) RV SV: 37mL/sq-m (88mal 35-61 mL/sq-m) CI: 1.9L/min/sq-m (Normal 1.8-3.8 L/min/sq-m) Left atrium: Normal size Right atrium: Mild enlargement Mitral valve: No regurgitation Aortic valve: No regurgitation Tricuspid valve: Trivial regurgitation Pulmonic valve: Trivial regurgitation Aorta: Normal proximal ascending aorta Pericardium: Small effusion IMPRESSION: 1.  Normal LV size, wall thickness, and systolic function (EF 55%) 2.  [id number] dilatation with moderate systolic dysfunction (EF 38%) 3.  No [id number] gadolinium enhancement Electronically Signed   By: Christopher Oswaldo Milian02/27/2024 22:22   MR CARDIAC VELOCITY FLOW MAP  Result Date: 07/29/2022 CLINICAL DATA:  38F p/w cardiac arrest. Echo with EF 50-55%, normal RV function, no significant valvular disease. EXAM: CARDIAC MRI TECHNIQUE: The patient was scanned on a 1.5 Tesla Siemens magnet. A dedicated cardiac coil was used. Functional imaging was done using Fiesta sequences. 2,3, and 4 chamber views were done to assess for  RWMA's. Modified Simpson's rule using a short axis stack was used to calculate an ejection fraction on a dedicated work station usinConservation officer, naturet received 7 cc of Gadavist. After 10 minutes inversion recovery sequences were used to assess for infiltration and scar tissue. Phase contrast velocity mapping was performed above the aortic and pulmonic valves CONTRAST:  7 cc  of Gadavist FINDINGS: Left ventricle: -Normal wall thickness -Normal size -Normal systolic function -Mild ECV elevation (30%) -No LGE LV EF:  55% (Normal 52-79%) Absolute volumes: LV EDV: 118mL (Normal11m167 mL) LV ESV: 53mL (Normal 27m4 mL) LV SV: 65mL (Normal 554m4 mL) CO: 3.4L/min (Normal 2.7-6.3 L/min) Indexed volumes: LV EDV: 28mL/sq-m (Norm51m0-96 mL/sq-m) LV ESV: 31mL/sq-m (Norma21m-40 mL/sq-m) LV SV: 38mL/sq-m (Normal1m64 mL/sq-m) CI: 2.0L/min/sq-m (Normal 1.9-3.9 L/min/sq-m) Right ventricle: Mild dilatation with moderate systolic dysfunction RV EF: 38% (Normal 52-80%) Absolute volumes: RV EDV: 169mL (Normal 79-1753m) RV ESV: 105mL (Normal 13-75 4mRV SV: 64mL (Normal 56-110 28mCO:  3.3L/min (Normal 2.7-6 L/min) Indexed volumes: RV EDV: 54m/sq-m (Normal 51-97 mL/sq-m) RV ESV: 661msq-m (Normal 9-42 mL/sq-m) RV SV: 3739mq-m (Normal 35-61 mL/sq-m) CI: 1.9L/min/sq-m (Normal 1.8-3.8 L/min/sq-m) Left atrium: Normal size Right atrium: Mild enlargement Mitral valve: No regurgitation Aortic valve: No regurgitation Tricuspid valve: Trivial regurgitation Pulmonic valve: Trivial regurgitation Aorta: Normal proximal ascending aorta Pericardium: Small effusion IMPRESSION: 1.  Normal LV size, wall thickness, and systolic function (EF 55%[id number].  Mild RV dilatation with moderate systolic dysfunction (EF 38%[id number].  No late gadolinium enhancement Electronically Signed   By: ChrOswaldo MilianD.   On: 07/29/2022 22:21   MR CARDIAC MORPHOLOGY W WO CONTRAST  Result Date: 07/29/2022 CLINICAL DATA:  50F p/w cardiac arrest. Echo  with EF 50-55%, normal RV function, no significant valvular disease. EXAM: CARDIAC MRI TECHNIQUE: The patient was scanned on a 1.5 Tesla Siemens magnet. A dedicated cardiac coil was used. Functional imaging was done using Fiesta sequences. 2,3, and 4 chamber views were done to assess for RWMA's. Modified Simpson's rule using a short axis stack was used to calculate an ejection fraction on a dedicated work staConservation officer, naturehe patient received 7 cc of Gadavist. After 10 minutes inversion recovery sequences were used to assess for infiltration and scar tissue. Phase contrast velocity mapping was performed above the aortic and pulmonic valves CONTRAST:  7 cc  of Gadavist FINDINGS: Left ventricle: -Normal wall thickness -Normal size -Normal systolic function -Mild ECV elevation (30%) -No LGE LV EF:  55% (Normal 52-79%) Absolute volumes: LV EDV: 118m53mormal 78-167 mL) LV ESV: 53mL53mrmal 21-64 mL) LV SV: 65mL 4mmal 52-114 mL) CO: 3.4L/min (Normal 2.7-6.3 L/min) Indexed volumes: LV EDV: 68mL/s51m(Normal 50-96 mL/sq-m) LV ESV: 31mL/sq71mNormal 10-40 mL/sq-m) LV SV: 38mL/sq-44mormal 33-64 mL/sq-m) CI: 2.0L/min/sq-m (Normal 1.9-3.9 L/min/sq-m) Right ventricle: Mild dilatation with moderate systolic dysfunction RV EF: 38% (Normal 52-80%) Absolute volumes: RV EDV: 169mL (Nor68m79-175 mL) RV ESV: 105mL (Norm59m3-75 mL) RV SV: 64mL (Norma73m-110 mL) CO: 3.3L/min (Normal 2.7-6 L/min) Indexed volumes: RV EDV: 98mL/sq-m (N39ml 51-97 mL/sq-m) RV ESV: 61mL/sq-m (No13m 9-42 mL/sq-m) RV SV: 37mL/sq-m (Nor43m35-61 mL/sq-m) CI: 1.9L/min/sq-m (Normal 1.8-3.8 L/min/sq-m) Left atrium: Normal size Right atrium: Mild enlargement Mitral valve: No regurgitation Aortic valve: No regurgitation Tricuspid valve: Trivial regurgitation Pulmonic valve: Trivial regurgitation Aorta: Normal proximal ascending aorta Pericardium: Small effusion IMPRESSION: 1.  Normal LV size, wall thickness, and systolic function (EF 55%) 2.   Mild [id number] with moderate systolic dysfunction (EF 38%) 3.  No [id number] enhancement Electronically Signed   By: Christopher  ScOswaldo Milian27/2024 22:21   MR BRAIN WO CONTRAST  Result Date: 07/29/2022 CLINICAL DATA:  Mental status change, unknown cause. EXAM: MRI HEAD WITHOUT CONTRAST TECHNIQUE: Multiplanar, multiecho pulse sequences of the brain and surrounding structures were obtained without intravenous contrast. COMPARISON:  Head CT 07/20/2022.  MRI brain 07/23/2022. FINDINGS: Brain: No acute infarct or hemorrhage. No mass or midline shift. No hydrocephalus or extra-axial collection. Basilar cisterns are patent. Vascular: Normal flow voids. Skull and upper cervical spine: Normal marrow signal. Sinuses/Orbits: Unremarkable. Other: None. IMPRESSION: Normal brain MRI. Electronically Signed   By: Walter  WigginsEmmit Alexanders27/2024 14:26   CARDIAC CATHETERIZATION  Result Date: 07/23/2022 1.  Normal coronary arteries. 2.  Left ventricular angiography was not performed.  EF was 50 to 55% by echo. 3.  Mildly elevated left ventricular end-diastolic pressure at 14 mmHg. Recommendations: No ischemic etiology is identified  for cardiac arrest.   MR BRAIN WO CONTRAST  Result Date: 07/23/2022 CLINICAL DATA:  Anoxic brain damage EXAM: MRI HEAD WITHOUT CONTRAST TECHNIQUE: Multiplanar, multiecho pulse sequences of the brain and surrounding structures were obtained without intravenous contrast. COMPARISON:  CT head July 20, 2022. FINDINGS: Motion study.  Within this limitation: Brain: No acute infarction, hemorrhage, hydrocephalus, extra-axial collection or mass lesion. No convincing restricted diffusion involving the basal ganglia. Vascular: Major arterial flow voids are maintained at the skull base. Skull and upper cervical spine: Normal marrow signal. Sinuses/Orbits: Mucosal thickening of the right sphenoid and maxillary sinuses. No acute orbital findings. Other: No mastoid effusions.  IMPRESSION: Motion limited study without definite evidence of hypoxic/ischemic injury. If there is continued clinical concern, a follow-up MRI could assess for progressive/evolving changes. Electronically Signed   By: Margaretha Sheffield M.D.   On: 07/23/2022 11:46   ECHOCARDIOGRAM COMPLETE  Result Date: 07/21/2022    ECHOCARDIOGRAM REPORT   Patient Name:   NYOMII SOWADA Laban Date of Exam: 07/21/2022 Medical Rec #:  [id number]            Height:       65.0 in Accession #:    [id number]           Weight:       152.3 lb Date of Birth:  [birth year]             BSA:          1.762 m Patient Age:    26 years             BP:           110/68 mmHg Patient Gender: F                    HR:           60 bpm. Exam Location:  Inpatient Procedure: 2D Echo Indications:    cardiac arrest  History:        Patient has no prior history of Echocardiogram examinations.  Sonographer:    McGregor Referring Phys: [id number] [place]  1. Left ventricular ejection fraction, by estimation, is 50 to 55%. The left ventricle has low normal function. The left ventricle has no regional wall motion abnormalities. Left ventricular diastolic parameters were normal.  2. Right ventricular systolic function is normal. The right ventricular size is normal. There is mildly elevated pulmonary artery systolic pressure. The estimated right ventricular systolic pressure is [id number] mmHg.  3. The mitral valve is normal in structure. No evidence of mitral valve regurgitation. No evidence of mitral stenosis.  4. The aortic valve is tricuspid. Aortic valve regurgitation is not visualized. No aortic stenosis is present.  5. The inferior vena cava is dilated in size with <50% respiratory variability, suggesting right atrial pressure of 15 mmHg. FINDINGS  Left Ventricle: Left ventricular ejection fraction, by estimation, is 50 to 55%. The left ventricle has low normal function. The left ventricle has no regional wall motion abnormalities. The  left ventricular internal cavity size was normal in size. There is no left ventricular hypertrophy. Left ventricular diastolic parameters were normal. Right Ventricle: The right ventricular size is normal. No increase in right ventricular wall thickness. Right ventricular systolic function is normal. There is mildly elevated pulmonary artery systolic pressure. The tricuspid regurgitant velocity is 2.55  m/s, and with an assumed right atrial pressure of 15 mmHg, the estimated right ventricular systolic pressure is [id number] mmHg. Left  Atrium: Left atrial size was normal in size. Right Atrium: Right atrial size was normal in size. Pericardium: Trivial pericardial effusion is present. Mitral Valve: The mitral valve is normal in structure. No evidence of mitral valve regurgitation. No evidence of mitral valve stenosis. Tricuspid Valve: The tricuspid valve is normal in structure. Tricuspid valve regurgitation is trivial. Aortic Valve: The aortic valve is tricuspid. Aortic valve regurgitation is not visualized. No aortic stenosis is present. Pulmonic Valve: The pulmonic valve was normal in structure. Pulmonic valve regurgitation is trivial. Aorta: The aortic root is normal in size and structure. Venous: The inferior vena cava is dilated in size with less than 50% respiratory variability, suggesting right atrial pressure of 15 mmHg. IAS/Shunts: No atrial level shunt detected by color flow Doppler.  LEFT VENTRICLE PLAX 2D LVIDd:         3.90 cm     Diastology LVIDs:         2.40 cm     LV e' medial:    11.50 cm/s LV PW:         0.90 cm     LV E/e' medial:  6.9 LV IVS:        0.80 cm     LV e' lateral:   14.10 cm/s LVOT diam:     2.30 cm     LV E/e' lateral: 5.6 LV SV:         59 LV SV Index:   34 LVOT Area:     4.15 cm  LV Volumes (MOD) LV vol d, MOD A4C: 68.8 ml LV vol s, MOD A4C: 36.6 ml LV SV MOD A4C:     68.8 ml RIGHT VENTRICLE             IVC RV Basal diam:  2.70 cm     IVC diam: 2.60 cm RV S prime:     13.10 cm/s TAPSE  (M-mode): 2.6 cm LEFT ATRIUM           Index        RIGHT ATRIUM           Index LA diam:      2.90 cm 1.65 cm/m   RA Area:     10.70 cm LA Vol (A4C): 35.4 ml 20.09 ml/m  RA Volume:   22.40 ml  12.71 ml/m  AORTIC VALVE LVOT Vmax:   74.40 cm/s LVOT Vmean:  48.700 cm/s LVOT VTI:    0.143 m  AORTA Ao Root diam: 2.50 cm MITRAL VALVE               TRICUSPID VALVE MV Area (PHT): 2.80 cm    TR Peak grad:   26.0 mmHg MV Decel Time: 271 msec    TR Vmax:        255.00 cm/s MV E velocity: 79.20 cm/s MV A velocity: 50.70 cm/s  SHUNTS MV E/A ratio:  1.56        Systemic VTI:  0.14 m                            Systemic Diam: 2.30 cm Dalton McleanMD Electronically signed by Franki Monte Signature Date/Time: 07/21/2022/3:36:56 PM    Final    EEG adult  Result Date: 07/21/2022 Lora Havens, MD     07/21/2022 10:14 AM Patient Name: Tashiba Rawlins MRN: [id number] Epilepsy Attending: Lora Havens Referring Physician/Provider: Margaretmary Lombard, MD Date: 07/21/2022 Duration: 22.59 mins  Patient history: 27yo F s/p cardiac arrest. Level of alertness: Awake AEDs during EEG study: Propofol Technical aspects: This EEG study was done with scalp electrodes positioned according to the 10-20 International system of electrode placement. Electrical activity was reviewed with band pass filter of 1-'70Hz'$ , sensitivity of 7 uV/mm, display speed of 36m/sec with a '60Hz'$  notched filter applied as appropriate. EEG data were recorded continuously and digitally stored.  Video monitoring was available and reviewed as appropriate. Description: The posterior dominant rhythm consists of 9 Hz activity of moderate voltage (25-35 uV) seen predominantly in posterior head regions, symmetric and reactive to eye opening and eye closing. EEG showed continuous generalized 3 to 6 Hz theta-delta slowing. Hyperventilation and photic stimulation were not performed.   ABNORMALITY - Continuous slow, generalized IMPRESSION: This study is suggestive of  moderate diffuse encephalopathy, nonspecific etiology. No seizures or epileptiform discharges were seen throughout the recording. PLora Havens  CT Head Wo Contrast  Result Date: 07/21/2022 CLINICAL DATA:  Altered mental status EXAM: CT HEAD WITHOUT CONTRAST TECHNIQUE: Contiguous axial images were obtained from the base of the skull through the vertex without intravenous contrast. RADIATION DOSE REDUCTION: This exam was performed according to the departmental dose-optimization program which includes automated exposure control, adjustment of the mA and/or kV according to patient size and/or use of iterative reconstruction technique. COMPARISON:  None Available. FINDINGS: Brain: No evidence of acute infarction, hemorrhage, hydrocephalus, extra-axial collection or mass lesion/mass effect. Vascular: No hyperdense vessel or unexpected calcification. Skull: Normal. Negative for fracture or focal lesion. Sinuses/Orbits: No acute finding. Other: None. IMPRESSION: No acute intracranial abnormality noted. Electronically Signed   By: MInez CatalinaM.D.   On: 07/21/2022 00:12   CT Angio Chest PE W and/or Wo Contrast  Result Date: 07/21/2022 CLINICAL DATA:  Difficulty breathing, status post intubation EXAM: CT ANGIOGRAPHY CHEST WITH CONTRAST TECHNIQUE: Multidetector CT imaging of the chest was performed using the standard protocol during bolus administration of intravenous contrast. Multiplanar CT image reconstructions and MIPs were obtained to evaluate the vascular anatomy. RADIATION DOSE REDUCTION: This exam was performed according to the departmental dose-optimization program which includes automated exposure control, adjustment of the mA and/or kV according to patient size and/or use of iterative reconstruction technique. CONTRAST:  779mOMNIPAQUE IOHEXOL 350 MG/ML SOLN COMPARISON:  Chest x-ray from earlier in the same day. FINDINGS: Cardiovascular: Thoracic aorta its branches are within normal limits. No  aneurysmal dilatation or dissection is seen. No cardiac enlargement is noted. No filling defect to suggest pulmonary embolism is noted. Pulmonary artery shows a normal branching pattern. Mediastinum/Nodes: Thoracic inlet is within normal limits. Endotracheal tube and gastric catheter are noted in satisfactory position. Esophagus is within normal limits. No hilar or mediastinal adenopathy is noted. Lungs/Pleura: The lungs are well aerated bilaterally. No focal infiltrate or effusion is seen. Some inspissated material is noted in the right lower lobe bronchial tree. Mild basilar atelectasis is noted. Upper Abdomen: Visualized upper abdomen is within normal limits. Musculoskeletal: No acute rib abnormality is noted. No compression deformity is seen. Review of the MIP images confirms the above findings. IMPRESSION: No evidence of pulmonary emboli. Mild inspissated material in the right lower lobe bronchial tree likely related to mucous. Mild bibasilar atelectasis. Electronically Signed   By: MaInez Catalina.D.   On: 07/21/2022 00:11   DG Chest Portable 1 View  Result Date: 07/20/2022 CLINICAL DATA:  Post intubation. EXAM: PORTABLE CHEST 1 VIEW COMPARISON:  None Available. FINDINGS: The heart is normal in  size. Lungs are clear without evidence of focal consolidation or pleural effusion no acute osseous abnormality. Endotracheal tube with distal tip approximately 4.5 cm above the carina. Right PICC with distal tip likely in the IVC. Feeding tube coursing below the diaphragm with distal tip not included. IMPRESSION: 1. Endotracheal tube with distal tip approximately 4.5 cm above the carina. 2. Right PICC with distal tip likely in the IVC. Electronically Signed   By: Keane Police D.O.   On: 07/20/2022 23:07    Microbiology: Results for orders placed or performed during the hospital encounter of 07/20/22  MRSA Next Gen by PCR, Nasal     Status: None   Collection Time: 07/21/22  1:01 AM   Specimen: Nasal Mucosa;  Nasal Swab  Result Value Ref Range Status   MRSA by PCR Next Gen NOT DETECTED NOT DETECTED Final    Comment: (NOTE) The GeneXpert MRSA Assay (FDA approved for NASAL specimens only), is one component of a comprehensive MRSA colonization surveillance program. It is not intended to diagnose MRSA infection nor to guide or monitor treatment for MRSA infections. Test performance is not FDA approved in patients less than 41 years old. Performed at [facility], [place] [address] [place], [place] [address]   Resp panel by RT-PCR (RSV, Flu A&B, Covid) Anterior Nasal Swab     Status: None   Collection Time: 07/21/22  2:09 AM   Specimen: Anterior Nasal Swab  Result Value Ref Range Status   SARS Coronavirus 2 by RT PCR NEGATIVE NEGATIVE Final   Influenza A by PCR NEGATIVE NEGATIVE Final   Influenza B by PCR NEGATIVE NEGATIVE Final    Comment: (NOTE) The Xpert Xpress SARS-CoV-2/FLU/RSV plus assay is intended as an aid in the diagnosis of influenza from Nasopharyngeal swab specimens and should not be used as a sole basis for treatment. Nasal washings and aspirates are unacceptable for Xpert Xpress SARS-CoV-2/FLU/RSV testing.  Fact Sheet for Patients: EntrepreneurPulse.com.au  Fact Sheet for Healthcare Providers: IncredibleEmployment.be  This test is not yet approved or cleared by the [place] FDA and has been authorized for detection and/or diagnosis of SARS-CoV-2 by FDA under an Emergency Use Authorization (EUA). This EUA will remain in effect (meaning this test can be used) for the duration of the COVID-19 declaration under Section 564(b)(1) of the Act, 21 U.S.C. section 360bbb-3(b)(1), unless the authorization is terminated or revoked.     Resp Syncytial Virus by PCR NEGATIVE NEGATIVE Final    Comment: (NOTE) Fact Sheet for Patients: EntrepreneurPulse.com.au  Fact Sheet for Healthcare  Providers: IncredibleEmployment.be  This test is not yet approved or cleared by the [place] FDA and has been authorized for detection and/or diagnosis of SARS-CoV-2 by FDA under an Emergency Use Authorization (EUA). This EUA will remain in effect (meaning this test can be used) for the duration of the COVID-19 declaration under Section 564(b)(1) of the Act, 21 U.S.C. section 360bbb-3(b)(1), unless the authorization is terminated or revoked.  Performed at [facility], [place] [address] [place], [place] [address]   Surgical PCR screen     Status: None   Collection Time: 07/30/22  9:42 AM   Specimen: Nasal Mucosa; Nasal Swab  Result Value Ref Range Status   MRSA, PCR NEGATIVE NEGATIVE Final   Staphylococcus aureus NEGATIVE NEGATIVE Final    Comment: (NOTE) The Xpert SA Assay (FDA approved for NASAL specimens in patients 39 years of age and older), is one component of a comprehensive surveillance program. It  is not intended to diagnose infection nor to guide or monitor treatment. Performed at [facility], [place] [address] [place], [place] [address]     Labs: CBC: Recent Labs  Lab 07/25/22 0640 07/26/22 1036  WBC 6.8 6.2  NEUTROABS 4.8 4.3  HGB 10.8* 12.1  HCT 31.6* 35.4*  MCV 90.5 89.4  PLT 173 [id number]   Basic Metabolic Panel: Recent Labs  Lab 07/25/22 0640 07/26/22 1036 07/27/22 0622 07/28/22 0625  NA 139 139 137 137  K 3.4* 3.7 4.1 3.5  CL 110 105 106 107  CO2 20* '23 24 22  '$ GLUCOSE 83 115* 90 90  BUN 5* <5* 7 11  CREATININE 0.92 1.00 1.08* 0.86  CALCIUM 8.5* 8.8* 8.5* 8.6*  MG 1.8 1.9 1.9 2.0  PHOS 3.3 3.7  --   --    Liver Function Tests: Recent Labs  Lab 07/25/22 0640 07/26/22 1036 07/27/22 0622  AST 107* 72* 49*  ALT 53* 46* 38  ALKPHOS 54 62 53  BILITOT 0.5 0.7 0.5  PROT 6.2* 6.8 6.1*  ALBUMIN 3.2* 3.7 3.3*   CBG: No results for input(s): "GLUCAP" in the last 168 hours.  Discharge time spent: less than 30  minutes.  Signed: Annita Brod, MD [facility] 07/31/2022

## 2022-07-31 NOTE — Anesthesia Postprocedure Evaluation (Signed)
Anesthesia Post Note  Patient: Alexandra Freeman  Procedure(s) Performed: SUBQ ICD IMPLANT     Patient location during evaluation: PACU Anesthesia Type: General Level of consciousness: sedated and patient cooperative Pain management: pain level controlled Vital Signs Assessment: post-procedure vital signs reviewed and stable Respiratory status: spontaneous breathing Cardiovascular status: stable Anesthetic complications: no   There were no known notable events for this encounter.  Last Vitals:  Vitals:   07/31/22 0136 07/31/22 0520  BP: 120/72 (!) 101/59  Pulse: 78 72  Resp:  18  Temp:  36.7 C  SpO2: 100% 100%    Last Pain:  Vitals:   07/31/22 0520  TempSrc: Oral  PainSc:                  Nolon Nations

## 2022-07-31 NOTE — Plan of Care (Signed)
Problem: Education: Goal: Ability to manage disease process will improve Outcome: Adequate for Discharge   Problem: Cardiac: Goal: Ability to achieve and maintain adequate cardiopulmonary perfusion will improve Outcome: Adequate for Discharge   Problem: Neurologic: Goal: Promote progressive neurologic recovery Outcome: Adequate for Discharge   Problem: Skin Integrity: Goal: Risk for impaired skin integrity will be minimized. Outcome: Adequate for Discharge   Problem: Education: Goal: Knowledge of General Education information will improve Description: Including pain rating scale, medication(s)/side effects and non-pharmacologic comfort measures Outcome: Adequate for Discharge   Problem: Health Behavior/Discharge Planning: Goal: Ability to manage health-related needs will improve Outcome: Adequate for Discharge   Problem: Clinical Measurements: Goal: Ability to maintain clinical measurements within normal limits will improve Outcome: Adequate for Discharge Goal: Will remain free from infection Outcome: Adequate for Discharge Goal: Diagnostic test results will improve Outcome: Adequate for Discharge Goal: Respiratory complications will improve Outcome: Adequate for Discharge Goal: Cardiovascular complication will be avoided Outcome: Adequate for Discharge   Problem: Activity: Goal: Risk for activity intolerance will decrease Outcome: Adequate for Discharge   Problem: Nutrition: Goal: Adequate nutrition will be maintained Outcome: Adequate for Discharge   Problem: Coping: Goal: Level of anxiety will decrease Outcome: Adequate for Discharge   Problem: Elimination: Goal: Will not experience complications related to bowel motility Outcome: Adequate for Discharge Goal: Will not experience complications related to urinary retention Outcome: Adequate for Discharge   Problem: Pain Managment: Goal: General experience of comfort will improve Outcome: Adequate for  Discharge   Problem: Safety: Goal: Ability to remain free from injury will improve Outcome: Adequate for Discharge   Problem: Skin Integrity: Goal: Risk for impaired skin integrity will decrease Outcome: Adequate for Discharge   Problem: Safety: Goal: Non-violent Restraint(s) Outcome: Adequate for Discharge   Problem: Education: Goal: Knowledge of General Education information will improve Description: Including pain rating scale, medication(s)/side effects and non-pharmacologic comfort measures Outcome: Adequate for Discharge   Problem: Health Behavior/Discharge Planning: Goal: Ability to manage health-related needs will improve Outcome: Adequate for Discharge   Problem: Clinical Measurements: Goal: Ability to maintain clinical measurements within normal limits will improve Outcome: Adequate for Discharge Goal: Will remain free from infection Outcome: Adequate for Discharge Goal: Diagnostic test results will improve Outcome: Adequate for Discharge Goal: Respiratory complications will improve Outcome: Adequate for Discharge Goal: Cardiovascular complication will be avoided Outcome: Adequate for Discharge   Problem: Activity: Goal: Risk for activity intolerance will decrease Outcome: Adequate for Discharge   Problem: Nutrition: Goal: Adequate nutrition will be maintained Outcome: Adequate for Discharge   Problem: Coping: Goal: Level of anxiety will decrease Outcome: Adequate for Discharge   Problem: Elimination: Goal: Will not experience complications related to bowel motility Outcome: Adequate for Discharge Goal: Will not experience complications related to urinary retention Outcome: Adequate for Discharge   Problem: Pain Managment: Goal: General experience of comfort will improve Outcome: Adequate for Discharge   Problem: Safety: Goal: Ability to remain free from injury will improve Outcome: Adequate for Discharge   Problem: Skin Integrity: Goal: Risk  for impaired skin integrity will decrease Outcome: Adequate for Discharge   Problem: Safety: Goal: Non-violent Restraint(s) Outcome: Adequate for Discharge   Problem: Education: Goal: Understanding of CV disease, CV risk reduction, and recovery process will improve Outcome: Adequate for Discharge Goal: Individualized Educational Video(s) Outcome: Adequate for Discharge   Problem: Activity: Goal: Ability to return to baseline activity level will improve Outcome: Adequate for Discharge   Problem: Cardiovascular: Goal: Ability to achieve and maintain adequate  cardiovascular perfusion will improve Outcome: Adequate for Discharge Goal: Vascular access site(s) Level 0-1 will be maintained Outcome: Adequate for Discharge   Problem: Health Behavior/Discharge Planning: Goal: Ability to safely manage health-related needs after discharge will improve Outcome: Adequate for Discharge   Problem: Education: Goal: Knowledge of cardiac device and self-care will improve Outcome: Adequate for Discharge Goal: Ability to safely manage health related needs after discharge will improve Outcome: Adequate for Discharge Goal: Individualized Educational Video(s) Outcome: Adequate for Discharge

## 2022-08-10 NOTE — Progress Notes (Unsigned)
Cardiology Office Note Date:  08/12/2022  Patient ID:  Alexandra Freeman, DOB 1996/03/16, MRN [id number] PCP:  Patient, No Pcp Per  Cardiologist:  Dr. Marlou Porch (new at the hospital) Electrophysiologist: Dr. Myles Gip (new at the hospital)    Chief Complaint:  wound check, set up genetics  History of Present Illness: Alexandra Freeman is a 28 y.o. 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 She is doing well Reports the site is very painful, uncomfortable Still not sleeping No reports of syncope Due to go back to work the 29th at [employer] S-ICD implanted 07/30/22   Past Medical History:  Diagnosis Date   Medical history non-contributory    Menorrhagia     Past Surgical History:  Procedure Laterality Date   LEFT HEART CATH AND CORONARY ANGIOGRAPHY N/A 07/23/2022   Procedure: LEFT HEART CATH AND CORONARY ANGIOGRAPHY;  Surgeon: Wellington Hampshire, MD;  Location: [facility];  Service: Cardiovascular;  Laterality: N/A;   NO PAST SURGERIES     RADIOLOGY WITH ANESTHESIA N/A 07/29/2022   Procedure: RADIOLOGY WITH ANESTHESIA MRI CARDIOLOGY;  Surgeon: Radiologist, Medication, MD;  Location: [place];  Service: Radiology;  Laterality: N/A;   SUBQ ICD IMPLANT N/A 07/30/2022   Procedure: SUBQ ICD IMPLANT;  Surgeon: Melida Quitter, MD;  Location: [facility];  Service: Cardiovascular;  Laterality: N/A;    Current Outpatient Medications  Medication Sig Dispense Refill   acetaminophen (TYLENOL) 500 MG tablet Take 500-1,000 mg by mouth every 6 (six) hours as  needed for moderate pain.     traMADol (ULTRAM) 50 MG tablet Take by mouth every 6 (six) hours as needed.     No current facility-administered medications for this visit.    Allergies:   Patient has no allergy information on record.   Social History:  The patient  reports that she has never smoked. She has never used smokeless tobacco. She reports that she does not drink alcohol and does not use drugs.   Family History:  The patient's family history includes Polymyositis in her mother.  ROS:  Please see the history of present illness.    All other systems are reviewed and otherwise negative.   PHYSICAL EXAM:  VS:  BP 110/70   Pulse 86   Ht '5\' 5"'$  (1.651 m)   Wt 151 lb 3.2 oz (68.6 kg)   LMP 07/25/2022   SpO2 97%   BMI 25.16 kg/m  BMI: Body mass index is 25.16 kg/m. Well nourished, well developed, in no acute distress HEENT: normocephalic, atraumatic Neck: no JVD, carotid bruits or masses Cardiac:  RRR; no significant murmurs, no rubs, or gallops Lungs:  CTA b/l, no wheezing, rhonchi or rales Abd: soft, nontender MS: no deformity or atrophy Ext: no edema Skin: warm and dry, no rash Neuro:  No gross deficits appreciated Psych: euthymic mood, full affect  ICD site: steri strips are removed from both sites, skin edges are well approximated, no erythema, edema, or heat, no hematoma, bleeding, drainage Both site appear well healed   EKG:  not done  today  Device interrogation done today and reviewed by myself:  Battery is 100% No arrhythmias or therapies Electrode 40Ohms   cMRI 07/29/22 - No LGE, normal LVEF, RVEF 38% (mod down)   07/23/22: LHC Normal coronary arteries. 2.  Left ventricular angiography was not performed.  EF was 50 to 55% by echo. 3.  Mildly elevated left ventricular end-diastolic pressure at 14 mmHg.  07/21/22: TTE 1. Left ventricular ejection fraction, by estimation, is 50 to 55%. The  left ventricle has low normal function. The left ventricle has no  regional  wall motion abnormalities. Left ventricular diastolic parameters were  normal.   2. Right ventricular systolic function is normal. The right ventricular  size is normal. There is mildly elevated pulmonary artery systolic  pressure. The estimated right ventricular systolic pressure is [id number] mmHg.   3. The mitral valve is normal in structure. No evidence of mitral valve  regurgitation. No evidence of mitral stenosis.   4. The aortic valve is tricuspid. Aortic valve regurgitation is not  visualized. No aortic stenosis is present.   5. The inferior vena cava is dilated in size with <50% respiratory  variability, suggesting right atrial pressure of 15 mmHg   Recent Labs: 07/26/2022: Hemoglobin 12.1; Platelets 225 07/27/2022: ALT 38 07/28/2022: BUN 11; Creatinine, Ser 0.86; Magnesium 2.0; Potassium 3.5; Sodium 137  07/22/2022: Triglycerides 48   Estimated Creatinine Clearance: 94.7 mL/min (by C-G formula based on SCr of 0.86 mg/dL).   Wt Readings from Last 3 Encounters:  08/12/22 151 lb 3.2 oz (68.6 kg)  07/31/22 150 lb 9.2 oz (68.3 kg)  10/08/20 179 lb 9.6 oz (81.5 kg)     Other studies reviewed: Additional studies/records reviewed today include: summarized above  ASSESSMENT AND PLAN:  ICD Well healed, no signs of infection She asks about pain meds, though the sites look great, advised tylenol and continue slow increase in activities OK to work as planned  Re-discussed no driving 6 months  Cardiac arrest Family history uncertain particularly on her father' side Not SCD on her Mom's side Refer to genetics  Given information for PMD, her mom says sometimes still a little confused, not sleeping well.   Disposition: F/u with [place] for her 91 day  Current medicines are reviewed at length with the patient today.  The patient did not have any concerns regarding medicines.  Venetia Night, PA-C 08/12/2022 2:18 PM     [place] [address] [phone] (office)  [phone] (fax)

## 2022-08-12 ENCOUNTER — Ambulatory Visit: Payer: Medicaid Other | Attending: Physician Assistant | Admitting: Physician Assistant

## 2022-08-12 ENCOUNTER — Encounter: Payer: Self-pay | Admitting: Physician Assistant

## 2022-08-12 VITALS — BP 110/70 | HR 86 | Ht 65.0 in | Wt 151.2 lb

## 2022-08-12 DIAGNOSIS — I469 Cardiac arrest, cause unspecified: Secondary | ICD-10-CM

## 2022-08-12 DIAGNOSIS — Z95810 Presence of automatic (implantable) cardiac defibrillator: Secondary | ICD-10-CM | POA: Diagnosis not present

## 2022-08-12 DIAGNOSIS — Z5189 Encounter for other specified aftercare: Secondary | ICD-10-CM | POA: Diagnosis not present

## 2022-08-12 LAB — CUP PACEART INCLINIC DEVICE CHECK
Date Time Interrogation Session: 20240312165523
Implantable Lead Connection Status: 753985
Implantable Lead Implant Date: 20240228
Implantable Lead Location: 753860
Implantable Lead Model: 3501
Implantable Lead Serial Number: 250549
Implantable Pulse Generator Implant Date: 20240228
Pulse Gen Serial Number: 300879

## 2022-08-12 NOTE — Patient Instructions (Addendum)
Medication Instructions:   Your physician recommends that you continue on your current medications as directed. Please refer to the Current Medication list given to you today.  *If you need a refill on your cardiac medications before your next appointment, please call your pharmacy*   Lab Work:  [place]    If you have labs (blood work) drawn today and your tests are completely normal, you will receive your results only by: [facility] (if you have MyChart) OR A paper copy in the mail If you have any lab test that is abnormal or we need to change your treatment, we will call you to review the results.   Testing/Procedures: NONE ORDERED  TODAY    Follow-Up: At [facility], you and your health needs are our priority.  As part of our continuing mission to provide you with exceptional heart care, we have created designated Provider Care Teams.  These Care Teams include your primary Cardiologist (physician) and Advanced Practice Providers (APPs -  Physician Assistants and Nurse Practitioners) who all work together to provide you with the care you need, when you need it.  We recommend signing up for the patient portal called "MyChart".  Sign up information is provided on this After Visit Summary.  MyChart is used to connect with patients for Virtual Visits (Telemedicine).  Patients are able to view lab/test results, encounter notes, upcoming appointments, etc.  Non-urgent messages can be sent to your provider as well.   To learn more about what you can do with MyChart, go to [email or web address].    Your next appointment: You have been referred to GENETICS      RESCHEDULE  85 WITH DR Myles Gip NOT [place]  atherosclerotic heart disease  Provider:   Doralee Albino, MD   Other Instructions

## 2022-08-13 ENCOUNTER — Ambulatory Visit: Payer: Medicaid Other | Attending: Genetic Counselor | Admitting: Genetic Counselor

## 2022-08-13 DIAGNOSIS — I469 Cardiac arrest, cause unspecified: Secondary | ICD-10-CM

## 2022-08-18 NOTE — Progress Notes (Cosign Needed)
Pre test Genetic Consult  Referring Provider: Renee L. Olevia Bowens  Referral Reason Alexandra Freeman was referred for genetic consult and testing for sudden cardiac arrest subsequent to a sudden cardiac arrest event.  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   [name] suffered a sudden cardiac arrest on Jul 20, 2021. She reports no untoward symptoms prior to this event. She tells me that she had left work at 7.30 p.m. and was at home talking and laughing with her mother and stepmother when she had a cardiac arrest. She leaned over and fell on the floor. EMS was called, she was shocked twice and given CPR. Cardiac imaging studies did not detect any abnormalities. She had an ICD implanted and was discharged on 07/31/2022  Traditional Risk factors Reviewed the risk factors that can also lead to SCA, namely obstructive heart disease, low EF, long QT or BrS EKG profile, hypokalemia or drug overdose. She denies any of these.   Family history There is no history of heart disease in her family.  Alexandra Freeman does not have siblings. She has a 4 year old son and tells me that she has been under a lot of stress since the murder of her partner 1 year ago.   Her father (II.1) is 38 with no overt heart issues. Paternal grandfather (I.1) died in a motor vehicle accident. Paternal grandmother (I.2) is 15 and in great health.  Mother (II.2) is 30 with a history of polymyositis. Both paternal uncles, ages 18 and 72 (II.3-II.4) and paternal grandparents who are now in their 57s (I.3, I.4) are in great health.  Pre-Test Genetic Consultation Notes  I explained to them that cardiac arrests in an older population is usually associated with coronary artery disease. When a young and otherwise healthy individual suffers a sudden cardiac arrest, it is usually from an inherited arrhythmia and/or  cardiomyopathy.  They were counseled on the genetics of the channelopathies, specifically Brugada syndrome (BrS), Long QT syndrome (LQTS), and Catecholaminergic Polymorphic Ventricular Tachycardia (CPVT) as well as cardiomyopathies including hypertrophic, arrhythmogenic and dilated cardiomyopathy.  We discussed autosomal dominant inheritance, incomplete penetrance and variable expression associated with these conditions.   We walked through the process of genetic testing and discussed the potential outcomes of genetic testing. Genetic testing identifies a pathogenic variant in 10% of unexplained cardiac arrest survivors Alexandra Freeman et al., 2022) where a majority of disease-causing variants were found to be located in genes associated with cardiomyopathy in the absence of an overt cardiomyopathy diagnosis.      Explained to them that there are three possible outcomes of genetic testing; namely positive, negative and variant of unknown significance. A positive outcome can be expected in patients that do not have risk factors for the above inherited conditions, present early in life with increased severity and have a family history of sudden cardiac death and/or a relative that has been diagnosed with an inherited condition. I informed them that the genetic basis of SCA and most individual conditions that predispose to SCA remain incompletely understood. While a negative test may indicate that this is an idiopathic condition this may necessitate routine clinical surveillance in first-degree relatives. Variants of unknown significance (VUS) can be obtained. I explained to him that typically a VUS is so classified if the variant is not well understood as very few individuals have been reported to harbor this variant or its role in gene function  has not been elucidated.  I also reviewed genetic testing of first-degree family members and subsequent follow-up of those at risk of inheriting the underlying genetic  condition. I explained to them that genetic evaluation may influence final diagnosis, treatment recommendations, and family screening.   Impression  In summary, Alexandra Freeman presented with SCA at age 12 in the absence of risk factors. 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 However, there is no family history of sudden death. Should be noted that while de novo mutations occur at a high frequency in CPVT (40%), moderate rate in LQTS, HCM (5%-10%) and rare in BrS (1%), the yield of genetic testing is higher with a family history of heart disease and/or sudden death. It is recommended that Alexandra Freeman undergoes genetic testing for inherited arrhythmias and cardiomyopathies to determine the cause of her SCA.   The genetic test will help confirm her diagnosis and identify the genetic basis of her disease. Since this is an autosomal dominant condition, a positive test result will help identify first-degree family members (son and parents) that may harbor the mutation and are at risk of developing this condition. Appropriate cardiology follow-up and lifestyle management can then be directed to those genotype-positive family members.   In addition, patient should be aware of the protections afforded by the Genetic Information Non-Discrimination Act (GINA). GINA protects a patient from losing their employment or health insurance based on their genotype. However, these protections do not cover life insurance and disability.   Please note that the patient has not been counseled in this visit on personal, cultural or ethical issues that he may face due to his heart condition.   Plan After a thorough discussion of the risk and benefits of genetic testing for sudden cardiac arrest, Sariyah declines genetic testing as her insurance will not cover the test. Meanwhile, she will have her parents and son undergo baseline cardiac screening  of echocardiogram and EKG and have regular follow-ups every 3-5 years for adults. Her son can commence regular screening when he reaches puberty and further screening will be based on the recommendations of their cardiologist. She verbalized understanding.  Lattie Corns, Ph.D, [facility] Clinical Molecular Geneticist

## 2022-08-19 ENCOUNTER — Telehealth: Payer: Self-pay | Admitting: Cardiology

## 2022-08-19 NOTE — Telephone Encounter (Signed)
Patient needs to letter stating, from the time she been out up until she can return to work. Please advise

## 2022-08-19 NOTE — Telephone Encounter (Signed)
Spoke with the patient who states that she needs a letter that she is okay to return to work. She states that she is due to go back on 3/29 but since that is Good Friday she will not actually be going back until 4/1. Per office visit with Renee, the patient is okay to return to work at that time. She does state that she is still having pain as described at her visit with Renee. Advised to limit activities and take Tylenol as needed. She would like a letter faxed to [phone]

## 2022-08-22 ENCOUNTER — Telehealth: Payer: Self-pay | Admitting: Physician Assistant

## 2022-08-22 NOTE — Telephone Encounter (Signed)
Received fmla pw. Called both #'s on file, no voicemail. Sent [employer]. Waiting for pt response. Form in clear box at front desk.

## 2022-08-27 ENCOUNTER — Telehealth: Payer: Self-pay | Admitting: Cardiology

## 2022-08-27 NOTE — Telephone Encounter (Signed)
Letter has been faxed.

## 2022-08-27 NOTE — Telephone Encounter (Signed)
Spoke with the patient who wants to clarify when she is able to go back to work. When she was seen in the office by Joseph Art she stated that she was supposed to go back to work on March 29th. Per Renee patient would be okay to return to work at this time. Patient was given the date of April 1st to return to work due to March 29th being Good Friday. Patient states that she is now concerned because she is still having some pain at her site, although it has improved. Patient is now concerned that she will not be able to work since she cannot lift over 10 lbs for 6 weeks which will be until April 10th. She is going to speak with her work and call us back if there is additional information that we need to send over to them.

## 2022-08-27 NOTE — Telephone Encounter (Signed)
Patient called and said that they do need additional information to send over for the patient to return to work on April 10th instead of April 1st. Please send to [phone].

## 2022-08-27 NOTE — Telephone Encounter (Signed)
Patient is calling to clarify when her return to work date is, she is not sure if it's April 1st or April 10th.  She state she is still having pain at the defib site, when she moves a certain way. She states she still doesn't feel at 100%.  She states she doesn't feel like she is at 100% to return back to work.

## 2022-09-16 ENCOUNTER — Telehealth: Payer: Self-pay | Admitting: Critical Care Medicine

## 2022-09-16 NOTE — Telephone Encounter (Signed)
On 2/23 we received a faxed FMLA form for the patient's daughter, Raelyn Number.  I referred the paperwork to the hospital case manager to complete the form while patient was in the hospital.  This patient will not be seen in this office following discharge and Dr. Chestine Spore only sees patients as in-patient.

## 2022-09-18 ENCOUNTER — Telehealth: Payer: Self-pay

## 2022-09-18 NOTE — Telephone Encounter (Signed)
Following alert received from [employer] received for Nonbillable S-ICD remote reviewed. Normal device function.  Presenitng rhythm is fast, sent to triage.   Called patient to send a remote transmission to review the presenting and assess for any s/s. No answer, unable to leave VM.

## 2022-09-19 ENCOUNTER — Telehealth: Payer: Self-pay

## 2022-09-19 NOTE — Telephone Encounter (Signed)
Patient attempted to send remote transmission. Lights on box are blinking orange. [employer] phone number given to patient and advised to call DC back with update.

## 2022-09-19 NOTE — Telephone Encounter (Signed)
Patient has questions about her paper work in regard to short term disability paperwork based on her work note from 08/27/22. Advised I will forward to nurse and someone follow up on.

## 2022-09-22 NOTE — Telephone Encounter (Signed)
Attempted to contact patient. No answer,LMTCB 

## 2022-09-23 NOTE — Telephone Encounter (Signed)
Spoke with the patient who states that her work needs more information on why she could not return to work until April 10th. She states that she has been back at work but they did not pay her from 3/29 to 4/10 because of the change in return date.

## 2022-09-23 NOTE — Telephone Encounter (Signed)
Reviewed transmission.  Normal sinus rhythm at 130 bpm.   Pt is 27 year old active female.  Physiologically normal for heart rate.  No action needed.

## 2022-09-29 NOTE — Telephone Encounter (Signed)
Pt called back stating please send to this fax instead Fax: [phone]

## 2022-09-29 NOTE — Telephone Encounter (Signed)
Letter has been faxed. Patient is aware. Patient reports that she has still been having some pain and discomfort at her ICD site. She also reports some fatigue. She states it feels like her device may have shifted. I have moved her follow up appointment up.

## 2022-09-29 NOTE — Telephone Encounter (Signed)
Patient stated she is following-up on getting her disability paperwork faxed since February.  Patient stated the letter should read:  Patient's name, date of surgery, surgery code (CPT code) and signature of the provider.  Patient stated document should be faxed to [facility], fax# [phone].  Claim# [id number]

## 2022-10-15 ENCOUNTER — Ambulatory Visit: Payer: Medicaid Other | Attending: Cardiology | Admitting: Cardiovascular Disease

## 2022-10-15 NOTE — Progress Notes (Deleted)
  Electrophysiology Office Note:    Date:  10/15/2022   ID:  Alexandra Freeman, DOB Jul 26, 1995, MRN [id number]  PCP:  Patient, No Pcp Per   [facility] Providers Cardiologist:  None { Click to update primary MD,subspecialty MD or APP then REFRESH:1}    Referring MD: No ref. provider found   History of Present Illness:    Alexandra Freeman is a 27 y.o. female with a hx listed below, significant for ***, referred for arrhythmia management.  She met with a genetic counselor on August 13, 2022 and declined to undergo genetic testing.  Past Medical History:  Diagnosis Date   Medical history non-contributory    Menorrhagia     Past Surgical History:  Procedure Laterality Date   LEFT HEART CATH AND CORONARY ANGIOGRAPHY N/A 07/23/2022   Procedure: LEFT HEART CATH AND CORONARY ANGIOGRAPHY;  Surgeon: Iran Ouch, MD;  Location: [facility] INVASIVE CV LAB;  Service: Cardiovascular;  Laterality: N/A;   NO PAST SURGERIES     RADIOLOGY WITH ANESTHESIA N/A 07/29/2022   Procedure: RADIOLOGY WITH ANESTHESIA MRI CARDIOLOGY;  Surgeon: Radiologist, Medication, MD;  Location: [facility] OR;  Service: Radiology;  Laterality: N/A;   SUBQ ICD IMPLANT N/A 07/30/2022   Procedure: SUBQ ICD IMPLANT;  Surgeon: Maurice Small, MD;  Location: [facility] INVASIVE CV LAB;  Service: Cardiovascular;  Laterality: N/A;    Current Medications: No outpatient medications have been marked as taking for the 10/15/22 encounter (Appointment) with Leta Bucklin, Roberts Gaudy, MD.     Allergies:   Patient has no allergy information on record.   Social and Family History: Reviewed in Epic  ROS:   Please see the history of present illness.    All other systems reviewed and are negative.  EKGs/Labs/Other Studies Reviewed Today:    Echocardiogram:  *** ***   Monitors:  *** ***  Stress testing:  *** ***  Advanced imaging:  *** ***  Cardiac catherization  *** ***  EKG:  Last EKG results: ***   Recent  Labs: 07/26/2022: Hemoglobin 12.1; Platelets 225 07/27/2022: ALT 38 07/28/2022: BUN 11; Creatinine, Ser 0.86; Magnesium 2.0; Potassium 3.5; Sodium 137     Physical Exam:    VS:  There were no vitals taken for this visit.    Wt Readings from Last 3 Encounters:  08/12/22 151 lb 3.2 oz (68.6 kg)  07/31/22 150 lb 9.2 oz (68.3 kg)  10/08/20 179 lb 9.6 oz (81.5 kg)     GEN: *** Well nourished, well developed in no acute distress CARDIAC: ***RRR, no murmurs, rubs, gallops RESPIRATORY:  Normal work of breathing MUSCULOSKELETAL: *** edema    ASSESSMENT & PLAN:    Resuscitated sudden cardiac death ***  [employer] subcutaneous ICD in place   *** ***  *** ***  *** ***          Medication Adjustments/Labs and Tests Ordered: Current medicines are reviewed at length with the patient today.  Concerns regarding medicines are outlined above.  No orders of the defined types were placed in this encounter.  No orders of the defined types were placed in this encounter.    Signed, Maurice Small, MD  10/15/2022 1:10 PM    [facility]

## 2022-10-16 ENCOUNTER — Encounter: Payer: Self-pay | Admitting: Cardiovascular Disease

## 2022-10-30 ENCOUNTER — Ambulatory Visit (INDEPENDENT_AMBULATORY_CARE_PROVIDER_SITE_OTHER): Payer: Medicaid Other

## 2022-10-30 DIAGNOSIS — I469 Cardiac arrest, cause unspecified: Secondary | ICD-10-CM

## 2022-11-03 LAB — CUP PACEART REMOTE DEVICE CHECK
Battery Remaining Percentage: 98 %
Date Time Interrogation Session: 20240601001500
Implantable Lead Connection Status: 753985
Implantable Lead Implant Date: 20240228
Implantable Lead Location: 753860
Implantable Lead Model: 3501
Implantable Lead Serial Number: 250549
Implantable Pulse Generator Implant Date: 20240228
Pulse Gen Serial Number: 300879

## 2022-11-07 ENCOUNTER — Encounter: Payer: Medicaid Other | Admitting: Cardiology

## 2022-11-13 ENCOUNTER — Telehealth: Payer: Self-pay | Admitting: Cardiovascular Disease

## 2022-11-13 NOTE — Telephone Encounter (Signed)
Spoke to the pt, she feels her device has moved from her left side toward her back and  it's  sitting on a bone. Pt stated her back feels irritated, this happened couple of days ago. Will forward to device clinic for advise.

## 2022-11-13 NOTE — Telephone Encounter (Signed)
Patient has an apt. 11/28/22 with Dr. Nelly Laurence. Patient advised we recently received a remote transmission and all looks well. Advised to mention to Dr. Nelly Laurence at apt. And he can see what his thoughts are. Patient agreeable to plan and appreciative of call.

## 2022-11-13 NOTE — Telephone Encounter (Signed)
Patient feels the her defib has shifted causing it to sit on a bone. State that it is uncomfortable for her. Please advise

## 2022-11-13 NOTE — Telephone Encounter (Signed)
Attempted to call pt back. # not in service.

## 2022-11-18 NOTE — Progress Notes (Signed)
Remote ICD transmission.   

## 2022-11-28 ENCOUNTER — Encounter: Payer: Self-pay | Admitting: Cardiovascular Disease

## 2022-11-28 ENCOUNTER — Ambulatory Visit: Payer: Medicaid Other | Attending: Cardiovascular Disease | Admitting: Cardiovascular Disease

## 2022-11-28 VITALS — BP 120/78 | HR 66 | Ht 65.0 in | Wt 142.2 lb

## 2022-11-28 DIAGNOSIS — I469 Cardiac arrest, cause unspecified: Secondary | ICD-10-CM

## 2022-11-28 NOTE — Progress Notes (Signed)
  Electrophysiology Office Note:    Date:  11/28/2022   ID:  Alexandra Freeman, DOB 12-Dec-1995, MRN [id number]  PCP:  Patient, No Pcp Per   [facility] Providers Cardiologist:  None     Referring MD: No ref. provider found   History of Present Illness:    Alexandra Freeman is a 27 y.o. female with a medical history significant for out of hospital cardiac arrest s/p subcutaneous ICD who presents for device follow-up.     She was admitted in February 2024 after a spontaneous VF arrest.  Cardiac MRI showed normal LV ejection fraction without LGE, moderate RV dysfunction. A Subcutaneous ICD was placed.   She was referred to genetic counseling but declined testing due to lack of insurance coverage.     She reports that she is doing well.  She does have some tenderness at the axillary device site and notes that she has lost about 8 pounds since the device was placed.  She is continuing to try to gain some weight may be to provide a little more cushion.  EKGs/Labs/Other Studies Reviewed Today:    Echocardiogram:    Monitors:   Stress testing:   Advanced imaging:   Cardiac catherization  CMR 07/29/2022 LVEF 55%, Mild RV dilation, RV EF 38%   EKG:   EKG Interpretation Date/Time:  Friday November 28 2022 13:55:07 EDT Ventricular Rate:  66 PR Interval:  154 QRS Duration:  104 QT Interval:  390 QTC Calculation: 408 R Axis:   261  Text Interpretation: Normal sinus rhythm Right superior axis deviation Anterior infarct , age undetermined When compared with ECG of 24-Jul-2022 04:25, QRS axis Shifted left Confirmed by York Pellant [phone]) on 11/28/2022 2:15:08 PM     Physical Exam:    VS:  BP 120/78   Pulse 66   Ht 5\' 5"  (1.651 m)   Wt 142 lb 3.2 oz (64.5 kg)   SpO2 99%   BMI 23.66 kg/m     Wt Readings from Last 3 Encounters:  11/28/22 142 lb 3.2 oz (64.5 kg)  08/12/22 151 lb 3.2 oz (68.6 kg)  07/31/22 150 lb 9.2 oz (68.3 kg)     GEN: Well  nourished, well developed in no acute distress CARDIAC: RRR, no murmurs, rubs, gallops The device site is normal -- no tenderness, edema, drainage, redness, threatened erosion. RESPIRATORY:  Normal work of breathing MUSCULOSKELETAL: no edema    ASSESSMENT & PLAN:    Aborted VF arrest S-ICD in place I reviewed the device interrogation today in detail.  See Paceart She has not had any arrhythmia detected Declined genetic testing due to expense  [facility] [employer] subcutaneous ICD Normal function    Signed, Maurice Small, MD  11/28/2022 2:18 PM    [facility]

## 2022-11-28 NOTE — Patient Instructions (Signed)
Medication Instructions:  Your physician recommends that you continue on your current medications as directed. Please refer to the Current Medication list given to you today. *If you need a refill on your cardiac medications before your next appointment, please call your pharmacy*   Follow-Up: At [facility], you and your health needs are our priority.  As part of our continuing mission to provide you with exceptional heart care, we have created designated Provider Care Teams.  These Care Teams include your primary Cardiologist (physician) and Advanced Practice Providers (APPs -  Physician Assistants and Nurse Practitioners) who all work together to provide you with the care you need, when you need it.  We recommend signing up for the patient portal called "MyChart".  Sign up information is provided on this After Visit Summary.  MyChart is used to connect with patients for Virtual Visits (Telemedicine).  Patients are able to view lab/test results, encounter notes, upcoming appointments, etc.  Non-urgent messages can be sent to your provider as well.   To learn more about what you can do with MyChart, go to [email or web address].    Your next appointment:   1 year(s)  Provider:   Augustus Mealor, MD  

## 2023-01-28 ENCOUNTER — Telehealth: Payer: Self-pay | Admitting: Cardiovascular Disease

## 2023-01-28 NOTE — Telephone Encounter (Signed)
Patient is calling to inform us that her device is working. She was having issues sending her transmission because she went out of town. Patient stated on Monday she sent it and everything looked good with her device.

## 2023-03-02 ENCOUNTER — Ambulatory Visit (INDEPENDENT_AMBULATORY_CARE_PROVIDER_SITE_OTHER): Payer: Medicaid Other

## 2023-03-02 DIAGNOSIS — I469 Cardiac arrest, cause unspecified: Secondary | ICD-10-CM | POA: Diagnosis not present

## 2023-03-03 LAB — CUP PACEART REMOTE DEVICE CHECK
Battery Remaining Percentage: 94 %
Date Time Interrogation Session: 20240930231300
HighPow Impedance: 55 Ohm
Implantable Lead Connection Status: 753985
Implantable Lead Implant Date: 20240228
Implantable Lead Location: 753860
Implantable Lead Model: 3501
Implantable Lead Serial Number: 250549
Implantable Pulse Generator Implant Date: 20240228
Pulse Gen Serial Number: 300879

## 2023-03-16 NOTE — Progress Notes (Signed)
Remote ICD transmission.   

## 2023-06-01 ENCOUNTER — Ambulatory Visit (INDEPENDENT_AMBULATORY_CARE_PROVIDER_SITE_OTHER): Payer: Medicaid Other

## 2023-06-01 DIAGNOSIS — I469 Cardiac arrest, cause unspecified: Secondary | ICD-10-CM

## 2023-06-03 LAB — CUP PACEART REMOTE DEVICE CHECK
Battery Remaining Percentage: 91 %
Date Time Interrogation Session: 20241231201500
HighPow Impedance: 55 Ohm
Implantable Lead Connection Status: 753985
Implantable Lead Implant Date: 20240228
Implantable Lead Location: 753860
Implantable Lead Model: 3501
Implantable Lead Serial Number: 250549
Implantable Pulse Generator Implant Date: 20240228
Pulse Gen Serial Number: 300879

## 2023-08-31 ENCOUNTER — Ambulatory Visit (INDEPENDENT_AMBULATORY_CARE_PROVIDER_SITE_OTHER): Payer: Medicaid Other

## 2023-08-31 DIAGNOSIS — I469 Cardiac arrest, cause unspecified: Secondary | ICD-10-CM | POA: Diagnosis not present

## 2023-09-08 ENCOUNTER — Telehealth: Payer: Self-pay | Admitting: Cardiovascular Disease

## 2023-09-08 ENCOUNTER — Other Ambulatory Visit: Payer: Self-pay

## 2023-09-08 NOTE — Telephone Encounter (Signed)
 Spoke with patient.  She has not transmitted to [place] since January 30th.  Says she keeps getting yellow/orange light on monitor when attempting her weekly check ins.  Also, missed her defib ck.   (She has a SubQ ICD).    I gave her [employer] tech service number to call and troubleshoot. She also has our direct number to call if she has any further questions. She will call us back and let us know if she was able to re-estab connection and send.

## 2023-09-08 NOTE — Telephone Encounter (Signed)
  1. Has your device fired? no  2. Is you device beeping? no  3. Are you experiencing draining or swelling at device site? no  4. Are you calling to see if we received your device transmission? No pt was out of town during transmission and did not have her box with her. She wants to know what to do. Please advise  5. Have you passed out? no   Please route to Device Clinic Pool

## 2023-09-18 LAB — CUP PACEART REMOTE DEVICE CHECK
Battery Remaining Percentage: 88 %
Date Time Interrogation Session: 20250403191200
HighPow Impedance: 60 Ohm
Implantable Lead Connection Status: 753985
Implantable Lead Implant Date: 20240228
Implantable Lead Location: 753860
Implantable Lead Model: 3501
Implantable Lead Serial Number: 250549
Implantable Pulse Generator Implant Date: 20240228
Pulse Gen Serial Number: 300879

## 2023-09-22 ENCOUNTER — Encounter: Payer: Self-pay | Admitting: Cardiovascular Disease

## 2023-10-02 ENCOUNTER — Telehealth: Payer: Self-pay | Admitting: Cardiovascular Disease

## 2023-10-02 NOTE — Telephone Encounter (Signed)
 Pt c/o of Chest Pain: STAT if active (IN THIS MOMENT) CP, including tightness, pressure, jaw pain, shoulder/upper arm/back pain, SOB, nausea, and vomiting.  1. Are you having CP right now (tightness, pressure, or discomfort)? Yes   2. Are you experiencing any other symptoms (ex. SOB, nausea, vomiting, sweating)? No  3. How long have you been experiencing CP? 3 days   4. Is your CP continuous or coming and going? Continuous  5. Have you taken Nitroglycerin? No   6. If CP returns before callback, please consider calling 911. ?

## 2023-10-02 NOTE — Telephone Encounter (Signed)
"  Pain under arm, near my breast", 7/10, non radiating.  Started in right arm on Monday and felt pain in her chest, and now it has moved to left arm. Denies radiating pain, sob, weight gain. Denies recent activity that may have caused muscle strain. Pt advised to go to ED for evaluation.  Pt agreeable to plan.

## 2023-10-14 NOTE — Progress Notes (Signed)
 Remote ICD transmission.

## 2023-10-14 NOTE — Addendum Note (Signed)
 Addended by: Edra Govern D on: 10/14/2023 11:47 AM   Modules accepted: Orders

## 2023-12-02 ENCOUNTER — Ambulatory Visit: Attending: Cardiology | Admitting: Cardiovascular Disease

## 2023-12-02 ENCOUNTER — Encounter: Payer: Self-pay | Admitting: Cardiovascular Disease

## 2023-12-02 VITALS — BP 128/86 | HR 61 | Ht 65.0 in | Wt 137.0 lb

## 2023-12-02 DIAGNOSIS — I469 Cardiac arrest, cause unspecified: Secondary | ICD-10-CM | POA: Insufficient documentation

## 2023-12-02 DIAGNOSIS — Z95810 Presence of automatic (implantable) cardiac defibrillator: Secondary | ICD-10-CM | POA: Insufficient documentation

## 2023-12-02 DIAGNOSIS — I4901 Ventricular fibrillation: Secondary | ICD-10-CM | POA: Insufficient documentation

## 2023-12-02 NOTE — Patient Instructions (Signed)

## 2023-12-02 NOTE — Progress Notes (Signed)
  Electrophysiology Office Note:    Date:  12/02/2023   ID:  Alexandra Freeman, DOB [birth year], MRN [id number]  PCP:  Patient, No Pcp Per   [facility] Providers Cardiologist:  None     Referring MD: No ref. provider found   History of Present Illness:    Alexandra Freeman is a 28 y.o. female with a medical history significant for out of hospital cardiac arrest s/p subcutaneous ICD who presents for device follow-up.     She was admitted in February 2024 after a spontaneous VF arrest.  Cardiac MRI showed normal LV ejection fraction without LGE, moderate RV dysfunction. A Subcutaneous ICD was placed.   She was referred to genetic counseling but declined testing due to lack of insurance coverage.    She presents for follow-up today.  She has no complaints.  No device tenderness, no swelling, no drainage.  EKGs/Labs/Other Studies Reviewed Today:    Echocardiogram:    Monitors:   Stress testing:   Advanced imaging:   Cardiac catherization  CMR 07/29/2022 LVEF 55%, Mild RV dilation, RV EF 38%   EKG:         Physical Exam:    VS:  BP 128/86 (BP Location: Right Arm, Patient Position: Sitting, Cuff Size: Normal)   Pulse 61   Ht 5' 5 (1.651 m)   Wt 137 lb (62.1 kg)   SpO2 98%   BMI 22.80 kg/m     Wt Readings from Last 3 Encounters:  12/02/23 137 lb (62.1 kg)  11/28/22 142 lb 3.2 oz (64.5 kg)  08/12/22 151 lb 3.2 oz (68.6 kg)     GEN: Well nourished, well developed in no acute distress CARDIAC: RRR, no murmurs, rubs, gallops The device site is normal -- no tenderness, edema, drainage, redness, threatened erosion. RESPIRATORY:  Normal work of breathing MUSCULOSKELETAL: no edema    ASSESSMENT & PLAN:    Aborted VF arrest S-ICD in place I reviewed the device interrogation today in detail.  See Paceart She has not had any arrhythmia detected Declined genetic testing due to expense  [facility] [employer] subcutaneous ICD Normal  function    Signed, Eulas FORBES Furbish, MD  12/02/2023 4:56 PM    [facility]

## 2024-01-11 ENCOUNTER — Ambulatory Visit

## 2024-01-11 DIAGNOSIS — I469 Cardiac arrest, cause unspecified: Secondary | ICD-10-CM | POA: Diagnosis not present

## 2024-01-12 ENCOUNTER — Ambulatory Visit: Payer: Self-pay | Admitting: Cardiovascular Disease

## 2024-01-12 LAB — CUP PACEART REMOTE DEVICE CHECK
Battery Remaining Percentage: 84 %
Date Time Interrogation Session: 20250810101400
HighPow Impedance: 55 Ohm
Implantable Lead Connection Status: 753985
Implantable Lead Implant Date: 20240228
Implantable Lead Location: 753860
Implantable Lead Model: 3501
Implantable Lead Serial Number: 250549
Implantable Pulse Generator Implant Date: 20240228
Pulse Gen Serial Number: 300879

## 2024-02-26 NOTE — Progress Notes (Signed)
Remote ICD Transmission.

## 2024-04-11 ENCOUNTER — Ambulatory Visit (INDEPENDENT_AMBULATORY_CARE_PROVIDER_SITE_OTHER)

## 2024-04-11 DIAGNOSIS — I469 Cardiac arrest, cause unspecified: Secondary | ICD-10-CM | POA: Diagnosis not present

## 2024-04-14 LAB — CUP PACEART REMOTE DEVICE CHECK
Battery Remaining Percentage: 81 %
Date Time Interrogation Session: 20251112231800
HighPow Impedance: 55 Ohm
Implantable Lead Connection Status: 753985
Implantable Lead Implant Date: 20240228
Implantable Lead Location: 753860
Implantable Lead Model: 3501
Implantable Lead Serial Number: 250549
Implantable Pulse Generator Implant Date: 20240228
Pulse Gen Serial Number: 300879

## 2024-04-14 NOTE — Progress Notes (Signed)
 Remote ICD Transmission

## 2024-05-02 ENCOUNTER — Ambulatory Visit: Payer: Self-pay | Admitting: Cardiovascular Disease

## 2024-07-11 ENCOUNTER — Encounter

## 2024-08-29 ENCOUNTER — Encounter

## 2024-10-10 ENCOUNTER — Encounter

## 2024-11-28 ENCOUNTER — Encounter

## 2025-01-09 ENCOUNTER — Encounter

## 2025-02-27 ENCOUNTER — Encounter

## 2025-04-10 ENCOUNTER — Encounter

## 2025-05-29 ENCOUNTER — Encounter

## 2025-07-10 ENCOUNTER — Encounter

## 2025-08-28 ENCOUNTER — Encounter

## 2025-10-09 ENCOUNTER — Encounter

## 2025-11-27 ENCOUNTER — Encounter

## 2026-01-08 ENCOUNTER — Encounter

## 2026-02-26 ENCOUNTER — Encounter

## 2026-04-09 ENCOUNTER — Encounter

## 2026-05-28 ENCOUNTER — Encounter
